# Patient Record
Sex: Female | Born: 1950
Health system: Southern US, Community
[De-identification: ages and names within clinical notes are randomized; demographics above are authoritative.]

## PROBLEM LIST (undated history)

## (undated) DIAGNOSIS — H269 Unspecified cataract: Secondary | ICD-10-CM

## (undated) DIAGNOSIS — N183 Chronic kidney disease, stage 3 unspecified: Secondary | ICD-10-CM

## (undated) DIAGNOSIS — E7800 Pure hypercholesterolemia, unspecified: Secondary | ICD-10-CM

## (undated) DIAGNOSIS — T7840XA Allergy, unspecified, initial encounter: Secondary | ICD-10-CM

## (undated) DIAGNOSIS — G9341 Metabolic encephalopathy: Secondary | ICD-10-CM

## (undated) DIAGNOSIS — C801 Malignant (primary) neoplasm, unspecified: Secondary | ICD-10-CM

## (undated) DIAGNOSIS — Z9989 Dependence on other enabling machines and devices: Secondary | ICD-10-CM

## (undated) DIAGNOSIS — R270 Ataxia, unspecified: Secondary | ICD-10-CM

## (undated) DIAGNOSIS — F0390 Unspecified dementia without behavioral disturbance: Secondary | ICD-10-CM

## (undated) DIAGNOSIS — C719 Malignant neoplasm of brain, unspecified: Secondary | ICD-10-CM

## (undated) DIAGNOSIS — H35039 Hypertensive retinopathy, unspecified eye: Secondary | ICD-10-CM

## (undated) DIAGNOSIS — C3490 Malignant neoplasm of unspecified part of unspecified bronchus or lung: Secondary | ICD-10-CM

## (undated) DIAGNOSIS — G4700 Insomnia, unspecified: Secondary | ICD-10-CM

## (undated) DIAGNOSIS — M810 Age-related osteoporosis without current pathological fracture: Secondary | ICD-10-CM

## (undated) DIAGNOSIS — E11319 Type 2 diabetes mellitus with unspecified diabetic retinopathy without macular edema: Secondary | ICD-10-CM

## (undated) HISTORY — DX: Allergy, unspecified, initial encounter: T78.40XA

## (undated) HISTORY — DX: Unspecified cataract: H26.9

## (undated) HISTORY — PX: LOBECTOMY: SHX5089

## (undated) HISTORY — DX: Dependence on other enabling machines and devices: Z99.89

## (undated) HISTORY — DX: Hypertensive retinopathy, unspecified eye: H35.039

## (undated) HISTORY — DX: Metabolic encephalopathy: G93.41

## (undated) HISTORY — DX: Age-related osteoporosis without current pathological fracture: M81.0

## (undated) HISTORY — DX: Type 2 diabetes mellitus with unspecified diabetic retinopathy without macular edema: E11.319

## (undated) HISTORY — PX: OTHER SURGICAL HISTORY: SHX169

## (undated) HISTORY — PX: EYE SURGERY: SHX253

## (undated) HISTORY — DX: Chronic kidney disease, stage 3 (moderate): N18.3

## (undated) HISTORY — DX: Insomnia, unspecified: G47.00

## (undated) HISTORY — DX: Ataxia, unspecified: R27.0

## (undated) HISTORY — DX: Chronic kidney disease, stage 3 unspecified: N18.30

## (undated) HISTORY — PX: CATARACT EXTRACTION: SUR2

---

## 1984-04-19 HISTORY — PX: ABDOMINAL HYSTERECTOMY: SHX81

## 1998-10-02 ENCOUNTER — Encounter: Admission: RE | Admit: 1998-10-02 | Discharge: 1998-10-14 | Payer: Self-pay

## 1999-02-03 ENCOUNTER — Ambulatory Visit (HOSPITAL_BASED_OUTPATIENT_CLINIC_OR_DEPARTMENT_OTHER): Admission: RE | Admit: 1999-02-03 | Discharge: 1999-02-03 | Payer: Self-pay | Admitting: Orthopedic Surgery

## 1999-04-20 HISTORY — PX: BRAIN SURGERY: SHX531

## 1999-11-01 ENCOUNTER — Encounter (INDEPENDENT_AMBULATORY_CARE_PROVIDER_SITE_OTHER): Payer: Self-pay | Admitting: Specialist

## 1999-11-01 ENCOUNTER — Encounter: Payer: Self-pay | Admitting: Emergency Medicine

## 1999-11-02 ENCOUNTER — Inpatient Hospital Stay (HOSPITAL_COMMUNITY): Admission: EM | Admit: 1999-11-02 | Discharge: 1999-11-06 | Payer: Self-pay | Admitting: Emergency Medicine

## 1999-11-02 ENCOUNTER — Encounter: Payer: Self-pay | Admitting: Neurological Surgery

## 1999-11-16 ENCOUNTER — Encounter: Admission: RE | Admit: 1999-11-16 | Discharge: 2000-02-14 | Payer: Self-pay | Admitting: Radiation Oncology

## 1999-11-20 ENCOUNTER — Ambulatory Visit (HOSPITAL_COMMUNITY): Admission: RE | Admit: 1999-11-20 | Discharge: 1999-11-20 | Payer: Self-pay | Admitting: Hematology & Oncology

## 1999-11-20 ENCOUNTER — Encounter: Payer: Self-pay | Admitting: Hematology & Oncology

## 1999-12-03 ENCOUNTER — Encounter (INDEPENDENT_AMBULATORY_CARE_PROVIDER_SITE_OTHER): Payer: Self-pay | Admitting: *Deleted

## 1999-12-03 ENCOUNTER — Ambulatory Visit (HOSPITAL_COMMUNITY): Admission: RE | Admit: 1999-12-03 | Discharge: 1999-12-03 | Payer: Self-pay | Admitting: Thoracic Surgery

## 1999-12-03 ENCOUNTER — Encounter: Payer: Self-pay | Admitting: Thoracic Surgery

## 2000-01-07 ENCOUNTER — Encounter: Payer: Self-pay | Admitting: Thoracic Surgery

## 2000-01-08 ENCOUNTER — Encounter: Payer: Self-pay | Admitting: Thoracic Surgery

## 2000-01-08 ENCOUNTER — Inpatient Hospital Stay (HOSPITAL_COMMUNITY): Admission: RE | Admit: 2000-01-08 | Discharge: 2000-01-14 | Payer: Self-pay | Admitting: Thoracic Surgery

## 2000-01-08 ENCOUNTER — Encounter (INDEPENDENT_AMBULATORY_CARE_PROVIDER_SITE_OTHER): Payer: Self-pay | Admitting: *Deleted

## 2000-01-09 ENCOUNTER — Encounter: Payer: Self-pay | Admitting: Thoracic Surgery

## 2000-01-10 ENCOUNTER — Encounter: Payer: Self-pay | Admitting: Thoracic Surgery

## 2000-01-11 ENCOUNTER — Encounter: Payer: Self-pay | Admitting: Thoracic Surgery

## 2000-01-12 ENCOUNTER — Encounter: Payer: Self-pay | Admitting: Thoracic Surgery

## 2000-01-20 ENCOUNTER — Encounter: Payer: Self-pay | Admitting: Thoracic Surgery

## 2000-01-20 ENCOUNTER — Encounter: Admission: RE | Admit: 2000-01-20 | Discharge: 2000-01-20 | Payer: Self-pay | Admitting: Thoracic Surgery

## 2000-02-05 ENCOUNTER — Encounter: Payer: Self-pay | Admitting: Neurological Surgery

## 2000-02-05 ENCOUNTER — Ambulatory Visit (HOSPITAL_COMMUNITY): Admission: RE | Admit: 2000-02-05 | Discharge: 2000-02-05 | Payer: Self-pay | Admitting: Neurological Surgery

## 2000-02-17 ENCOUNTER — Encounter: Admission: RE | Admit: 2000-02-17 | Discharge: 2000-02-17 | Payer: Self-pay | Admitting: Thoracic Surgery

## 2000-02-17 ENCOUNTER — Encounter: Payer: Self-pay | Admitting: Thoracic Surgery

## 2000-03-17 ENCOUNTER — Encounter: Payer: Self-pay | Admitting: Hematology & Oncology

## 2000-03-17 ENCOUNTER — Ambulatory Visit (HOSPITAL_COMMUNITY): Admission: RE | Admit: 2000-03-17 | Discharge: 2000-03-17 | Payer: Self-pay | Admitting: Hematology & Oncology

## 2000-05-16 ENCOUNTER — Ambulatory Visit (HOSPITAL_COMMUNITY): Admission: RE | Admit: 2000-05-16 | Discharge: 2000-05-16 | Payer: Self-pay | Admitting: Hematology & Oncology

## 2000-05-16 ENCOUNTER — Encounter: Payer: Self-pay | Admitting: Hematology & Oncology

## 2000-07-26 ENCOUNTER — Encounter: Admission: RE | Admit: 2000-07-26 | Discharge: 2000-07-26 | Payer: Self-pay | Admitting: Thoracic Surgery

## 2000-07-26 ENCOUNTER — Encounter: Payer: Self-pay | Admitting: Thoracic Surgery

## 2000-08-25 ENCOUNTER — Encounter: Payer: Self-pay | Admitting: Hematology & Oncology

## 2000-08-25 ENCOUNTER — Encounter: Admission: RE | Admit: 2000-08-25 | Discharge: 2000-08-25 | Payer: Self-pay | Admitting: Hematology & Oncology

## 2000-10-25 ENCOUNTER — Encounter: Admission: RE | Admit: 2000-10-25 | Discharge: 2000-10-25 | Payer: Self-pay | Admitting: Thoracic Surgery

## 2000-10-25 ENCOUNTER — Encounter: Admission: RE | Admit: 2000-10-25 | Discharge: 2000-10-25 | Payer: Self-pay | Admitting: Hematology & Oncology

## 2000-10-25 ENCOUNTER — Encounter: Payer: Self-pay | Admitting: Thoracic Surgery

## 2000-10-25 ENCOUNTER — Encounter: Payer: Self-pay | Admitting: Hematology & Oncology

## 2000-12-06 ENCOUNTER — Encounter: Admission: RE | Admit: 2000-12-06 | Discharge: 2000-12-06 | Payer: Self-pay | Admitting: Hematology & Oncology

## 2000-12-06 ENCOUNTER — Encounter: Payer: Self-pay | Admitting: Hematology & Oncology

## 2001-02-24 ENCOUNTER — Encounter: Admission: RE | Admit: 2001-02-24 | Discharge: 2001-02-24 | Payer: Self-pay | Admitting: Thoracic Surgery

## 2001-02-24 ENCOUNTER — Encounter: Payer: Self-pay | Admitting: Thoracic Surgery

## 2001-05-04 ENCOUNTER — Encounter: Admission: RE | Admit: 2001-05-04 | Discharge: 2001-05-04 | Payer: Self-pay | Admitting: Hematology & Oncology

## 2001-05-04 ENCOUNTER — Encounter: Payer: Self-pay | Admitting: Hematology & Oncology

## 2001-08-23 ENCOUNTER — Encounter: Admission: RE | Admit: 2001-08-23 | Discharge: 2001-08-23 | Payer: Self-pay | Admitting: Thoracic Surgery

## 2001-08-23 ENCOUNTER — Encounter: Payer: Self-pay | Admitting: Hematology & Oncology

## 2001-08-23 ENCOUNTER — Encounter: Admission: RE | Admit: 2001-08-23 | Discharge: 2001-08-23 | Payer: Self-pay | Admitting: Hematology & Oncology

## 2001-08-23 ENCOUNTER — Encounter: Payer: Self-pay | Admitting: Thoracic Surgery

## 2002-01-10 ENCOUNTER — Encounter: Payer: Self-pay | Admitting: Sports Medicine

## 2002-01-10 ENCOUNTER — Encounter: Admission: RE | Admit: 2002-01-10 | Discharge: 2002-01-10 | Payer: Self-pay | Admitting: Sports Medicine

## 2002-01-11 ENCOUNTER — Encounter: Admission: RE | Admit: 2002-01-11 | Discharge: 2002-01-11 | Payer: Self-pay | Admitting: Hematology & Oncology

## 2002-01-11 ENCOUNTER — Encounter: Payer: Self-pay | Admitting: Hematology & Oncology

## 2002-02-20 ENCOUNTER — Encounter: Admission: RE | Admit: 2002-02-20 | Discharge: 2002-02-20 | Payer: Self-pay | Admitting: Thoracic Surgery

## 2002-02-20 ENCOUNTER — Encounter: Payer: Self-pay | Admitting: Thoracic Surgery

## 2002-07-16 ENCOUNTER — Encounter: Payer: Self-pay | Admitting: Oncology

## 2002-07-16 ENCOUNTER — Encounter: Admission: RE | Admit: 2002-07-16 | Discharge: 2002-07-16 | Payer: Self-pay | Admitting: Oncology

## 2002-08-31 ENCOUNTER — Encounter: Payer: Self-pay | Admitting: Thoracic Surgery

## 2002-08-31 ENCOUNTER — Encounter: Admission: RE | Admit: 2002-08-31 | Discharge: 2002-08-31 | Payer: Self-pay | Admitting: Thoracic Surgery

## 2003-02-07 ENCOUNTER — Ambulatory Visit (HOSPITAL_COMMUNITY): Admission: RE | Admit: 2003-02-07 | Discharge: 2003-02-07 | Payer: Self-pay | Admitting: Hematology & Oncology

## 2003-02-07 ENCOUNTER — Encounter: Payer: Self-pay | Admitting: Hematology & Oncology

## 2003-08-14 ENCOUNTER — Encounter: Admission: RE | Admit: 2003-08-14 | Discharge: 2003-08-14 | Payer: Self-pay | Admitting: Thoracic Surgery

## 2004-02-13 ENCOUNTER — Encounter: Admission: RE | Admit: 2004-02-13 | Discharge: 2004-02-13 | Payer: Self-pay | Admitting: Thoracic Surgery

## 2004-08-12 ENCOUNTER — Encounter: Admission: RE | Admit: 2004-08-12 | Discharge: 2004-08-12 | Payer: Self-pay | Admitting: Thoracic Surgery

## 2005-02-01 ENCOUNTER — Ambulatory Visit: Payer: Self-pay | Admitting: Hematology & Oncology

## 2005-02-10 ENCOUNTER — Encounter: Admission: RE | Admit: 2005-02-10 | Discharge: 2005-02-10 | Payer: Self-pay | Admitting: Thoracic Surgery

## 2005-07-29 ENCOUNTER — Inpatient Hospital Stay (HOSPITAL_COMMUNITY): Admission: RE | Admit: 2005-07-29 | Discharge: 2005-07-31 | Payer: Self-pay | Admitting: Internal Medicine

## 2005-10-05 ENCOUNTER — Ambulatory Visit: Payer: Self-pay | Admitting: Internal Medicine

## 2006-02-15 ENCOUNTER — Encounter: Admission: RE | Admit: 2006-02-15 | Discharge: 2006-02-15 | Payer: Self-pay | Admitting: Thoracic Surgery

## 2007-09-26 ENCOUNTER — Ambulatory Visit (HOSPITAL_COMMUNITY): Admission: RE | Admit: 2007-09-26 | Discharge: 2007-09-26 | Payer: Self-pay | Admitting: Family Medicine

## 2008-03-19 ENCOUNTER — Ambulatory Visit: Payer: Self-pay | Admitting: Internal Medicine

## 2008-04-02 ENCOUNTER — Encounter: Payer: Self-pay | Admitting: Internal Medicine

## 2008-04-02 ENCOUNTER — Ambulatory Visit: Payer: Self-pay | Admitting: Internal Medicine

## 2008-04-05 ENCOUNTER — Encounter: Payer: Self-pay | Admitting: Internal Medicine

## 2009-06-12 ENCOUNTER — Ambulatory Visit (HOSPITAL_COMMUNITY): Admission: RE | Admit: 2009-06-12 | Discharge: 2009-06-12 | Payer: Self-pay | Admitting: Ophthalmology

## 2010-05-09 ENCOUNTER — Encounter: Payer: Self-pay | Admitting: Thoracic Surgery

## 2010-05-10 ENCOUNTER — Encounter: Payer: Self-pay | Admitting: Family Medicine

## 2010-05-19 NOTE — Procedures (Signed)
Summary: Colonoscopy   Colonoscopy  Procedure date:  04/02/2008  Findings:      Location:  [facility].    Procedures Next Due Date:    Colonoscopy: 03/2013  COLONOSCOPY PROCEDURE REPORT  PATIENT:  Jamie [name], Jamie [name]  MR#:  [id number] BIRTHDATE:   [birth year]   GENDER:   female  ENDOSCOPIST:   Iva Boop, MD, [facility]    PROCEDURE DATE:  04/02/2008 PROCEDURE:  Colonoscopy with snare polypectomy ASA CLASS:   Class I INDICATIONS: history of polyps single adenoma 2004  MEDICATIONS:    Fentanyl 50 mcg IV, Versed 5 mg IV  DESCRIPTION OF PROCEDURE:   After the risks benefits and alternatives of the procedure were thoroughly explained, informed consent was obtained.  Digital rectal exam was performed and revealed no abnormalities.   The  endoscope was introduced through the anus and advanced to the cecum, which was identified by both the appendix and ileocecal valve, without limitations.  The quality of the MiraLax prep was excellent.  The instrument was then slowly withdrawn as the colon was fully examined. <<PROCEDUREIMAGES>>        <<OLD IMAGES>>  FINDINGS:  A sessile polyp was found in the sigmoid colon. It was 7 mm in size. Polyp was snared, then cauterized with monopolar cautery. Retrieval was successful (see image4). snare polyp  Moderate diverticulosis was found (see image3).  Internal hemorrhoids were found in the rectum. They were small (see image5).  The examination was otherwise normal.   Retroflexed views in the rectum revealed no abnormalities.    The scope was then withdrawn from the patient and the procedure completed.  COMPLICATIONS:   None  ENDOSCOPIC IMPRESSION:  1) 7 mm sessile polyp in the sigmoid colon  2) Moderate diverticulosis  3) Internal hemorrhoids in the rectum  4) Otherwise normal examination  5) Prior adenoma 2004  6) Family Hx colon Cancer (elderly father) RECOMMENDATIONS:  No ASA/NSAIDS x 2 weeks  REPEAT EXAM:   In 5 year(s) for.   likely   _______________________________ Iva Boop, MD, [facility]  CC: Lindaann Pascal PA The patient      REPORT OF SURGICAL PATHOLOGY   Case #: [id number] Patient Name: Jamie [name]. Office Chart Number:  [id number]   MRN: [id number] Pathologist: Ferd Hibbs. Colonel Bald, MD DOB/Age  10/07/1950 (Age: 60)    Gender: F Date Taken:  04/02/2008 Date Received: 04/02/2008   FINAL DIAGNOSIS   ***MICROSCOPIC EXAMINATION AND DIAGNOSIS***   COLON, SIGMOID, POLYP:  - TUBULAR ADENOMA.  - HIGH GRADE DYSPLASIA IS NOT IDENTIFIED.     mj Date Reported:  04/03/2008     Ferd Hibbs. Colonel Bald, MD *** Electronically Signed Out By JBK ***   Clinical information FX HX colon cancer (father) Polypectomy  Surveillance Polypi neoplasm surveillance colonoscopy neoplasm surveillance colonoscopy (jes)    specimen(s) obtained Colon, polyp(s), sigmoid   Gross Description Received in formalin is a tan, soft tissue fragment that is submitted in toto.  Size:  0.6 cm One block  (TA:jes 04/03/08)    jes/     Signed by Iva Boop MD on 04/05/2008 at 5:11 AM  ________________________________________________________________________ 5 yr 12/14 colon recall   Signed by Iva Boop MD on 04/05/2008 at 5:12 AM  ________________________________________________________________________    April 05, 2008 MRN: [id number]    [facility] [address] [place], [place]  [address]    Dear Ms. Buchberger,  The polyp removed from your colon was adenomatous. This means that it was pre-cancerous  or that  it had the potential to change into cancer over time.  I recommend that you have a repeat colonoscopy in 5 years to determine if you have developed any new polyps over time. If you develop any new rectal bleeding, abdominal pain or significant bowel habit changes, please contact us before then.   Should you develop new or worsening symptoms of abdominal pain, bowel habit changes or bleeding from the rectum or  bowels, please schedule an evaluation with either your primary care physician or with me.  Please call us if you are having persistent problems or have questions about your condition that have not been fully answered at this time.   Sincerely,  Iva Boop MD  This letter has been electronically signed by your physician.   Signed by Iva Boop MD on 04/05/2008 at 5:13 AM    This report was created from the original endoscopy report, which was reviewed and signed by the above listed endoscopist.

## 2010-05-19 NOTE — Letter (Signed)
Summary: Patient Notice- Polyp Results  [facility]  [address] [place], [place] [address]   Phone: [phone]  Fax: [phone]        April 05, 2008 MRN: [id number]    [facility] [address] [place], [place]  [address]    Dear Ms. Frey,  The polyp removed from your colon was adenomatous. This means that it was pre-cancerous or that  it had the potential to change into cancer over time.  I recommend that you have a repeat colonoscopy in 5 years to determine if you have developed any new polyps over time. If you develop any new rectal bleeding, abdominal pain or significant bowel habit changes, please contact us before then.  Should you develop new or worsening symptoms of abdominal pain, bowel habit changes or bleeding from the rectum or bowels, please schedule an evaluation with either your primary care physician or with me.  Please call us if you are having persistent problems or have questions about your condition that have not been fully answered at this time.   Sincerely,  Iva Boop MD  This letter has been electronically signed by your physician.

## 2010-05-19 NOTE — Miscellaneous (Signed)
Summary: [facility]  Clinical Lists Changes  Medications: Added new medication of MIRALAX   POWD (POLYETHYLENE GLYCOL 3350) As per prep  instructions. - Signed Added new medication of DULCOLAX 5 MG  TBEC (BISACODYL) Day before procedure take 2 at 3pm and 2 at 8pm. - Signed Added new medication of REGLAN 10 MG  TABS (METOCLOPRAMIDE HCL) As per prep instructions. - Signed Rx of MIRALAX   POWD (POLYETHYLENE GLYCOL 3350) As per prep  instructions.;  #255gm x 0;  Signed;  Entered by: Ezra Sites RN;  Authorized by: Iva Boop MD;  Method used: Print then Give to Patient Rx of DULCOLAX 5 MG  TBEC (BISACODYL) Day before procedure take 2 at 3pm and 2 at 8pm.;  #4 x 0;  Signed;  Entered by: Ezra Sites RN;  Authorized by: Iva Boop MD;  Method used: Print then Give to Patient Rx of REGLAN 10 MG  TABS (METOCLOPRAMIDE HCL) As per prep instructions.;  #2 x 0;  Signed;  Entered by: Ezra Sites RN;  Authorized by: Iva Boop MD;  Method used: Print then Give to Patient Allergies: Added new allergy or adverse reaction of SULFA Observations: Added new observation of NKA: F (03/19/2008 8:59)    Prescriptions: REGLAN 10 MG  TABS (METOCLOPRAMIDE HCL) As per prep instructions.  #2 x 0   Entered by:   Ezra Sites RN   Authorized by:   Iva Boop MD   Signed by:   Ezra Sites RN on 03/19/2008   Method used:   Print then Give to Patient   RxID:   [id number] DULCOLAX 5 MG  TBEC (BISACODYL) Day before procedure take 2 at 3pm and 2 at 8pm.  #4 x 0   Entered by:   Ezra Sites RN   Authorized by:   Iva Boop MD   Signed by:   Ezra Sites RN on 03/19/2008   Method used:   Print then Give to Patient   RxID:   [id number] MIRALAX   POWD (POLYETHYLENE GLYCOL 3350) As per prep  instructions.  #255gm x 0   Entered by:   Ezra Sites RN   Authorized by:   Iva Boop MD   Signed by:   Ezra Sites RN on 03/19/2008   Method used:   Print then Give to Patient   RxID:    [id number]

## 2010-07-09 LAB — BASIC METABOLIC PANEL
BUN: 11 mg/dL (ref 6–23)
CO2: 29 mEq/L (ref 19–32)
Calcium: 9.7 mg/dL (ref 8.4–10.5)
Chloride: 104 mEq/L (ref 96–112)
Creatinine, Ser: 1.08 mg/dL (ref 0.4–1.2)
GFR calc Af Amer: >60 mL/min (ref >60)
GFR calc non Af Amer: 52 mL/min — ABNORMAL LOW (ref >60)
Glucose, Bld: 81 mg/dL (ref 70–99)
Potassium: 4.6 mEq/L (ref 3.5–5.1)
Sodium: 140 mEq/L (ref 135–145)

## 2010-07-09 LAB — HEMOGLOBIN AND HEMATOCRIT, BLOOD
HCT: 39 % (ref 36.0–46.0)
Hemoglobin: 13.5 g/dL (ref 12.0–15.0)

## 2010-08-14 ENCOUNTER — Other Ambulatory Visit: Payer: Self-pay | Admitting: Ophthalmology

## 2010-08-14 ENCOUNTER — Encounter (HOSPITAL_COMMUNITY): Payer: Medicare Other

## 2010-08-14 LAB — BASIC METABOLIC PANEL
BUN: 12 mg/dL (ref 6–23)
CO2: 28 mEq/L (ref 19–32)
Calcium: 10.1 mg/dL (ref 8.4–10.5)
Chloride: 102 mEq/L (ref 96–112)
Creatinine, Ser: 1.09 mg/dL (ref 0.4–1.2)
GFR calc Af Amer: >60 mL/min (ref >60)
GFR calc non Af Amer: 52 mL/min — ABNORMAL LOW (ref >60)
Glucose, Bld: 105 mg/dL — ABNORMAL HIGH (ref 70–99)
Potassium: 4 mEq/L (ref 3.5–5.1)
Sodium: 138 mEq/L (ref 135–145)

## 2010-08-14 LAB — HEMOGLOBIN AND HEMATOCRIT, BLOOD
HCT: 41 % (ref 36.0–46.0)
Hemoglobin: 13.8 g/dL (ref 12.0–15.0)

## 2010-08-20 ENCOUNTER — Ambulatory Visit (HOSPITAL_COMMUNITY)
Admission: RE | Admit: 2010-08-20 | Discharge: 2010-08-20 | Disposition: A | Discharge status: Home / Self Care | Payer: Medicare Other | Source: Ambulatory Visit | Attending: Ophthalmology | Admitting: Ophthalmology

## 2010-08-20 DIAGNOSIS — H25049 Posterior subcapsular polar age-related cataract, unspecified eye: Secondary | ICD-10-CM | POA: Insufficient documentation

## 2010-09-04 NOTE — Op Note (Signed)
[place]. [facility]  Patient:    Jamie [name], Jamie [name]                        MRN: [id number] Proc. Date: 11/03/99 Adm. Date:  [id number] Attending:  Jonne Ply                           Operative Report  PREOPERATIVE DIAGNOSIS:  Left frontoparietal brain tumor.  POSTOPERATIVE DIAGNOSIS:  Left frontoparietal brain tumor.  OPERATION PERFORMED:  Left frontoparietal craniotomy and gross total evacuation of brain tumor.  SURGEON:  Stefani Dama, M.D.  FIRST ASSISTANT:  Hewitt Shorts, M.D.  ANESTHESIA:  General endotracheal.  FROZEN SECTION DIAGNOSIS:  Malignant tumor ? glioblastoma.  INDICATIONS FOR PROCEDURE:  The patient is a 60 year old individual who developed weakness on the right side and difficulty with speech.  She was found to have a left frontoparietal brain tumor.  DESCRIPTION OF PROCEDURE:  The patient was brought to the operating room and placed on the table in supine position.  After smooth induction of general endotracheal anesthesia, she was placed in a three pin headrest and the head was slightly turned to the right so as to expose the left frontoparietal region.  A large horseshoe flap was then created after cleansing and parting the hair and shaving a small strip of hair along where the incision would be made.  The flap was brought forward.  The underlying temporalis muscle was skimmed superiorly so as to expose the region centered on the coronal suture in front of and behind it.  A bur hole was then made over the temporalis region and then the craniotomy attachment on the Midas Rex was used to raise a craniotomy flap based on the sagittal sinus.  When the flap was raised, there was a small tear in the anterior portion of the sagittal sinus.  This was sewn primarily with 4-0 Nurolon suture.  The flap of the dura was then opened around the perimeter and flapped towards the midline against the sagittal sinus.  In the posterior  aspect behind the coronal suture there was noted to be a tumor that presented to the surface.  The pia over this area was cauterized.  The tumor fluid that was a straw colored fluid that was evacuated.  The tumor was adjacent to this, was very gelatinous and suckable, so biopsy specimens were sent.  As the tumor was evacuated by suction and irrigation, it was noted that the glial surfaces of the brain were markedly discolored yellow.  Once the tumor was completely evacuated, some biopsies were obtained from this discolored and rather edematous white matter.  Once these areas were resected free and clear and hemostasis in the tumor cavity was obtained, the area was inspected and it was felt that no further resection should be performed due to the delicate nature and the immediate ____________ region that this tumor existed.  Hemostasis thus being achieved, the dura was closed with 4-0 Nurolon sutures in interrupted fashion.  A gross total resection was felt to be had.  The brain was tacked up around the perimeter and then the bone flap was replaced and secured with Osteomed plates.  The scalp was then reflected and closed with 2-0 Vicryl in interrupted fashion in the galea and 3-0 nylon was used in the skin.  The patient tolerated the procedure well and was returned to the recovery room in  stable condition. Frozen section diagnosis at the time of closure was obtained and noted to be highly malignant tumor suspicious for glioblastoma. DD:  11/03/99 TD:  11/05/99 Job: [id number]

## 2010-09-04 NOTE — Discharge Summary (Signed)
[place]. [facility]  Patient:    Jamie [name], Jamie [name]                        MRN: [id number] Adm. Date:  [id number] Disc. Date: [id number] Attending:  Jonne Ply                           Discharge Summary  ADMISSION DIAGNOSIS:  Right-sided weakness with speech difficulties secondary to right parietal brain tumor.  DISCHARGE DIAGNOSES: 1. Right parietal metastatic brain tumor. 2. Lung cancer.  CONDITION ON DISCHARGE: Improving.  PRESCRIPTIONS: 1. Dilantin 100 mg three times a day. 2. Decadron 2 mg twice a day.  FOLLOW-UP:  The patient is to return to my office on Wednesday, November 11, 1999, for suture removal.  HOSPITAL COURSE: Jamie [name] is a 60 year old individual who presented to the emergency room with right-sided weakness of several days duration. She also complained of some headache.  A CT scan of the head demonstrated the presence of a left parietal mass with surrounding vasogenic edema.  Chest x-ray and follow-up work-up of the chest demonstrated presence of a right lung mass with some hilar nodes.  The patient underwent craniotomy for evacuation of the mass lesion on Tuesday, November 03, 1999.  She tolerated the procedure well; however, she has had weakness in the right arm.  She was seen in consultation by Rose Phi. Myna Hidalgo, M.D., from oncology service for further work-up regarding the chest mass. At this point the patient is improving in her right arm strength. She is ambulatory.  Speech difficulties appear to be resolving.  She is communicating quite well.  She was given a prescription for Dilantin and Decadron.  The pain has been minimal and she is taking Tylenol for this purpose. She will be seen in the office next week for further follow-up. DD:  11/06/99 TD:  11/10/99 Job: [id number]

## 2010-09-04 NOTE — H&P (Signed)
NAMESHARONA, ROVNER                 ACCOUNT NO.:  [id number]   MEDICAL RECORD NO.:  [id number]          PATIENT TYPE:  EMS   LOCATION:  [facility]                         FACILITY:  [facility]   PHYSICIAN:  Lonia Blood, M.D.DATE OF BIRTH:  [birth year]   DATE OF ADMISSION:  07/28/2005  DATE OF DISCHARGE:                                HISTORY & PHYSICAL   CHIEF COMPLAINT:  Cough with shortness of breath.   HISTORY OF PRESENT ILLNESS:  Ms. Jamie [name] is a very pleasant 61 year old  female with a history of stage IV non-small cell lung cancer who is status  post thoracotomy with right lower lobe lobectomy and a craniotomy for right  parietal lobe metastatic resection.  She has been in her usual state of  health and has had a clean bill of health from her oncologist and her  thoracic surgeon. One week ago, she experienced the abrupt onset of a dry  cough.  This progressed over the last seven days to turn into a cough  productive of thick sputum.  This has become associated with a sharp,  intermittent central chest pain.  This has further been accompanied by  shortness of breath, fevers, and chills over the last 2-3 days.  The  symptoms have worsened significantly today and, therefore, she presented to  the emergency room  for further evaluation.  The patient reports that,  otherwise, she has been in her usual state of health with no acute  difficulties.   REVIEW OF SYSTEMS:  Comprehensive review of symptoms is unremarkable with  the exception of the elements noted in the history of present illness above.   PAST MEDICAL HISTORY:  1.  Stage IV non-small cell lung cancer.      1.  Status post right parietal lobe metastatic lesion resection via          craniotomy in 2001.      2.  Status post VATS with thoracotomy and right lower lobe lobectomy in          2001.      3.  Status post radiation therapy with no chemotherapy.      4.  Tiny right upper lobe nodularity via CT scan in 2006 which  is stable          compared to the 2004 scan.  2.  Status post hysterectomy.  3.  History of tobacco abuse discontinued at the diagnosis of cancer.  4.  Normocytic anemia with hemoglobin 12.4 in September 2001.  5.  Hypertension.   MEDICATIONS:  1.  Aspirin 81 mg daily.  2.  Calcium daily.  3.  Folate daily.  4.  HCTZ 12.5 mg daily.  5.  Lopid dose unknown daily.   ALLERGIES:  SULFA leads to hives.   FAMILY HISTORY:  The patient's mother is deceased from unknown causes.  The  patient's father died from an MI in his 39s.   SOCIAL HISTORY:  The patient occasionally partakes of alcohol but never to  access.  She lives alone, she lives in [place], she is disabled due to her  lung cancer and subsequent treatment.   LABORATORY DATA:  Urinalysis reveals small hemoglobin, 100 protein, but is  otherwise unremarkable.  Chest x-ray reveals a right lower lobe infiltrate  with questionable early left lower lobe infiltrate.  White count is markedly  elevated at 15.6, hemoglobin low at 11.2, MCV normal, platelets normal.  Sodium, chloride, and bicarb are normal.  Potassium was low at 3.3.  BUN 16,  creatinine 1.2.  Serum glucose is elevated at 130, calcium 8.3.   PHYSICAL EXAMINATION:  VITAL SIGNS:  Temperature 97.9, blood pressure 87/59 and 92/56, heart rate  100, respiratory rate 22, O2 saturation 90% on 2 liters per minute nasal  cannula.  GENERAL:  Well developed, well nourished female who is presently somewhat  lethargic but displaying comfortable respirations.  HEENT:  Normocephalic, atraumatic, pupils equal, round, reactive to light  and accommodation, though somewhat constricted bilaterally.  OC/OP clear.  NECK:  No JVD, no lymphadenopathy.  LUNGS:  Bibasilar crackles with decreased air movement on the right base  without wheezes.  HEART:  Mildly tachycardic at approximately 100 beats per minute without  murmurs, gallops, and rubs with normal S1 and S2.  ABDOMEN:  Nontender,  nondistended, soft, bowel sounds present but  hypoactive.  No hepatosplenomegaly.  No rebound.  EXTREMITIES:  No significant cyanosis, clubbing, and edema in the bilateral  lower extremities.  NEUROLOGICAL:  The patient is somewhat lethargic at present but is easily  arousable.  She displays 5/5 strength in her extremities at the present  time.  Cranial nerves 2-12 are intact.   IMPRESSION AND PLAN:  1.  Bibasilar pneumonia - The patient presents with clinical and x-ray      evidence of a community acquired bilateral lower lobe pneumonia.  She      will be treated empirically with Rocephin and Azithromycin for atypical      coverage.  Symptomatic treatment will be afforded via Albuterol and      Humibid.  Robitussin will be added as necessary.  2.  Hypotension - Clinically, the patient appears to be in an early stage of      sepsis.  She is tachycardic and hypotensive.  Blood cultures have been      obtained.  The patient is being dosed with empiric antibiotics.  She      will be watched in the stepdown unit to insure that she stabilizes.      Aggressive IV fluid resuscitation will be carried out using isotonic      saline with supplemental potassium.  3.  Hypokalemia - The patient's hypokalemia is likely secondary to decreased      intake in the face of ongoing diuretic therapy.  The patient will be      supplemented via IV and we will follow the trend.  Magnesium level will      be obtained.  4.  Lung cancer status post craniotomy and thoracotomy - clinically, the      patient appears to be stable at this time.  She reports that follow up      with Dr. Edwyna Shell has been positive since her surgery in 2001.  There is      no indication the patient's current pneumonia is a complicated      pneumonia.  If the patient shows a failure to respond to conservative      therapy, we will consider CT scan of the chest to insure that there are     no new lesions  complicating her treatment.  5.   Normocytic anemia - the patient presents with a normocytic anemia.      Hemoglobin in September 2001 was, in fact, normal.  In a 59 year old      female, unexplained normocytic anemia can be concerning.  We will check      an iron panel, ferritin, B12, and folate.  We will guaiac stools.      Further workup will be directed by the results of these studies.  6.  Elevated serum glucose - the patient has an elevated serum glucose at      130 at the time of her presentation to the emergency room.  She has no      known history of diabetes mellitus.  This likely represents a stress      reaction given      that the patient is currently in an early stage of sepsis.  We will      choose to not treat at this time and we will simply follow the patient's      blood sugar trend.  Should her sugar fail to improve with the      appropriate clinical intervention and to the proposed underlying      etiology, we will consider further evaluation.      Lonia Blood, M.D.  Electronically Signed     JTM/MEDQ  D:  07/28/2005  T:  07/28/2005  Job:  [id number]   cc:   Jamie [name], M.D.  [address]  [place]  [place] [address]   Jamie [name], M.D.  Fax: [phone]

## 2010-09-04 NOTE — H&P (Signed)
[place]. [facility]  Patient:    Jamie [name], Jamie [name]                        MRN: [id number] Adm. Date:  [id number] Attending:  Jonne Ply                         History and Physical  ADMITTING DIAGNOSIS:  Left frontal brain tumor.  HISTORY OF PRESENT ILLNESS:  The patient is a 60 year old right-handed white female who has had three to four day onset of right-sided weakness.  She noted this initially with some handwriting difficulties when she was trying to write a check.  She subsequently was noted to have some abnormal gait by her sons, and over the past 48 hours, she has developed some speech problems.  She was seen in the emergency room last night by the emergency room doctor, and a CT scan of the brain demonstrated the presence of a large left frontal lesion. She has no known history of malignancy.  She has had a hysterectomy some 16 years ago.  She is, however, a smoker.  The patient notes that prior to this, she has been feeling well and does not see a physician regularly for any medical problems.  PAST MEDICAL HISTORY:  As such, without significant medical problems.  ALLERGIES:  She knows no allergies to any medications.  MEDICATIONS:  She uses no medication on a regular basis.  HABITS:  She smokes about a half a pack of cigarettes a day.  She drinks alcohol socially.  SYSTEMS REVIEW:  Notable for no complaints in the GI, GU, cardiovascular, respiratory, neurologic, musculoskeletal systems.  PHYSICAL EXAMINATION:  GENERAL:  Reveals that she is an alert and oriented individual with moderate degree of speech difficulty, difficulty finding words, but the words she does produce are fluent.  The patients attention and concentration appear to be normal.  She has a marked right-sided weakness, graded at 3/5, with a very severe droop.  She has weakness in the right lower extremity also.  HEENT:  Her cranial nerves reveal her pupils are 4 mm,  briskly reactive to light and accommodation.  Extraocular movements are full.  The face has a moderate right facial droop.  The tongue protrudes in the midline.  The uvula is midline.  The sclerae and conjunctivae are clear.  NECK:  Reveals no masses and no bruits are heard.  LUNGS:  Clear to auscultation.  HEART:  Regular rate and rhythm.  ABDOMEN:  Soft.  Bowel sounds are positive.  No masses are palpable.  EXTREMITIES:  Reveal no cyanosis, clubbing, or edema.  Peripheral pulses are intact.  NEUROLOGIC:  The patients right lower extremity weakness is significant also. Gait was not tested, but the patient demonstrates weakness and some spasticity of movement in the right lower extremity.  Her deep tendon reflexes are 2+ in the biceps on the right, 2+ in the triceps, 2+ in the patella, and 1+ in the Achilles.  On the left side, her reflexes are 1+ throughout.  Cortical sensation appears intact on the right side.  IMPRESSION:  Patient has a left frontal region.  Performed a chest x-ray, report demonstrates the presence of a hilar mass, suspicious for a tumor.  The patient will be evaluated with a CT scan of the chest today. DD:  11/02/99 TD:  11/02/99 Job: [id number]

## 2010-09-04 NOTE — Discharge Summary (Signed)
Jamie [name], Jamie [name]                 ACCOUNT NO.:  [id number]   MEDICAL RECORD NO.:  [id number]          PATIENT TYPE:  INP   LOCATION:  1605                         FACILITY:  [facility]   PHYSICIAN:  Lonia Blood, M.D.       DATE OF BIRTH:  06-Aug-1950   DATE OF ADMISSION:  07/29/2005  DATE OF DISCHARGE:  07/31/2005                                 DISCHARGE SUMMARY   DISCHARGE DIAGNOSES:  1.  Right lower lobe pneumonia.  2.  History of metastatic lung cancer; no signs of recurrence.  3.  Mild chronic obstructive pulmonary disease.  4.  Hypokalemia, resolved.  5.  Hypophosphatemia, resolved.  6.  Anemia.  The patient will require full evaluation by primary care      physician if she has never had a colonoscopy.  7.  Hypertension.  8.  Early sepsis, resolved.  9.  Herpes simplex virus, type 1, on the lips.   MEDICATIONS:  1.  Avelox 400 mg by mouth daily for 1 week.  2.  Albuterol MDI 1 puff 4 times a day.  3.  Aspirin 81 mg daily.  4.  Folic acid 1 mg daily.  5.  Mucinex 800 mg twice daily.  6.  Calcium 500 mg daily.  7.  Zovirax ointment 4 times a day for 3 days.   CONDITION ON DISCHARGE:  Jamie [name] will be discharged in fair condition.  At  time of discharge, she was instructed to continue antibiotics, to have food,  water, lip, and to increase activity slowly.  She also was instructed to  follow up with her physician, Dr. Dimple Casey, next week.  She will need a followup  chest x-ray to assure resolution of pneumonia, and she will need to follow  up labs for anemia to assure hemoglobin is stable.  If she never has had a  colonoscopy, she will require one.   PROCEDURES DURING THIS ADMISSION:  Ms. Delio underwent a chest x-ray that  showed a right lower lobe pneumonia.   CONSULTATIONS:  1.  The patient was seen in consultation by physical therapy who deemed her      safe to be discharged home in terms of mobility.  2.  Also she was seen by occupational therapy who decided the patient  did      not need followup occupational therapy.   HISTORY AND PHYSICAL:  For admission History and Physical, refer to H&P done  by Dr. Sharon Seller on July 29, 2005.   HOSPITAL COURSE:  #1.  EARLY SEPSIS:  Jamie [name] was admitted to the intensive care unit at  [facility].  She was started on broad-spectrum intravenous  antibiotics and liberal IV fluids.  She improved significantly, and by  hospital day #2, her sepsis had resolved.   #2.  RIGHT LOWER LOBE PNEUMONIA:  Ms.  [name] was started on intravenous  Rocephin and Zithromax, and she responded promptly.  Her chest x-ray showed  some mild improvement by the time of discharge.  The patient's leukocytosis  was resolved, and by the time of discharge, white blood  cell count was 6000  which was down from 15,000 on admission.  Jamie [name] was observed 24 hours on  oral antibiotics, and she remains afebrile without worsening of her  symptoms.  At the time of discharge, she was discharged on Avelox and  albuterol MDI, and she will follow up with her primary care physician to  assure that pneumonia resolves.   #3.  HYPOKALEMIA AND HYPOPHOSPHATEMIA SECONDARY TO DIURETIC USE: The  patient's hydrochlorothiazide was held.  Her potassium and phosphorus were  repleted, and she will follow up with Dr. Dimple Casey for recheck for basic  metabolic profile and decision about future use of hydrochlorothiazide.   #4.  HERPES LABIALIS:  On hospital day #2, Jamie [name] developed a lesion on  her lips consistent with HSV-1.  She was started on Zovirax locally.   #5.  CHRONIC OBSTRUCTIVE PULMONARY DISEASE:  This is really mild in nature.  The patient did not have any wheezing or respiratory failure.  She was  discharged home on an albuterol MDI p.r.n.      Lonia Blood, M.D.  Electronically Signed     SL/MEDQ  D:  07/31/2005  T:  08/01/2005  Job:  [id number]   cc:   Magnus Sinning. Rice, M.D.  Fax: [phone]

## 2010-09-04 NOTE — Discharge Summary (Signed)
[place]. [facility]  Patient:    Jamie [name], Jamie [name]                        MRN: [id number] Adm. Date:  [id number] Disc. Date: 01/14/00 Attending:  Cameron Proud Dictator:   Carlye Grippe. CC:         Rose Phi. Myna Hidalgo, M.D.  Stefani Dama, M.D.  Margaretmary Dys, M.D.   Discharge Summary  DATE OF BIRTH:  1951/01/23  HISTORY OF PRESENT ILLNESS:  This is a 60 year old female first seen in August 2001 at [facility] after presenting at the [facility] Emergency Room with complaints of right-sided weakness and dysarthria.  CT scan showed a left parietofrontal brain tumor.  She was evaluated by Dr. Danielle Dess and underwent tumor resection with postoperative radiation.  Pathology revealed a poorly differentiated cancer presumably metastatic from the lung.  Chest x-ray and CT scans confirmed right lower lung lesion.  She had no mediastinal adenopathy. Other metastatic workup including bone scan and CT scan were negative. Patient underwent mediastinoscopy with negative 4L and 4R lymph nodes. Radiation therapy was completed on December 18, 1999.  She was admitted this hospitalization for the right lower lobe resection.  PAST MEDICAL HISTORY:  Brain metastatic disease and right lower lobe lung cancer as described above otherwise unremarkable.  She is on estrogen replacement therapy.  PAST SURGICAL HISTORY:  Craniotomy July 2001, history of bronchoscopy and mediastinoscopy August 2001, history of hysterectomy 1986, history of carpal tunnel surgery left wrist in 2000.  ADMISSION MEDICATIONS: 1. Dilantin 100 mg t.i.d. 2. Premarin 1.25 mg q.d.  ALLERGIES:  SULFA.  REVIEW OF SYSTEMS/FAMILY HISTORY/SOCIAL HISTORY/PHYSICAL EXAMINATION:  Please see the history and physical done at the time of admission.  HOSPITAL COURSE:  Patient was admitted and brought to the operating room where she underwent the following procedure: Right video-assisted thoracoscopy  with right thoracotomy and right lower lobe lobectomy with lymph node dissection. Patient tolerated the procedure well and was taken to the postanesthesia care unit in stable condition.  Postoperative hospital course, pathologies revealed poorly differentiated nonsmall cell carcinoma with focal involvement of the visceral pleura.  Bronchial margin was negative for tumor.  One pair of bronchial lymph node was negative for tumor.  One 9R lymph node was negative for tumor, one 10R showed no tumor but extensive necrosis, and level 7 lymph node was negative for tumor.  The patient has responded well in regards to postsurgical recovery.  Oxygen has been weaned and she maintains good saturations on room air.  Her incisions are healing well.  She has  tolerated a gradual increase in activities.  She has undergone significant pulmonary toilet and has responded well.  Her hemodynamics have remained stable.  Her laboratory values are felt to be stable.  Hemoglobin and hematocrit dated January 10, 2000 were stable at 12 and 36 respectively.  Her chest tubes have been discontinued as have all routine lines and monitoring devices.  Overall, she is felt to be quite stable for tentative discharge in the morning of January 14, 2000 pending morning- round re-evaluation.  DISCHARGE MEDICATIONS: 1. Dilantin 100 mg t.i.d. 2. Premarin 1.25 mg q.d. 3. For pain, Tylox one or two every four to six hours as needed for pain.  DISCHARGE INSTRUCTIONS:  The patient received written instructions in regards to medications, activity, diet, wound care, and follow up.  FOLLOW-UP:  Dr. Edwyna Shell next week, Wednesday, January 20, 2000  with a chest x-ray at that time.  FINAL DIAGNOSIS:  Right lower lobe lung carcinoma as described above. DD:  01/13/00 TD:  01/13/00 Job: [id number]

## 2010-09-04 NOTE — Op Note (Signed)
[place]. [facility]  Patient:    Jamie [name], Jamie [name]                        MRN: [id number] Adm. Date:  [id number] Attending:  Cameron Proud CC:         Norton Blizzard, M.D. x 2  Rose Phi. Myna Hidalgo, M.D.   Operative Report  PREOPERATIVE DIAGNOSIS:  Stage IV nonsmall cell lung cancer.  POSTOPERATIVE DIAGNOSIS:  Stage IV nonsmall cell lung cancer.  PROCEDURE:  Right VATS, right thoracotomy, and right lower lobectomy with node dissection.  SURGEON:  D. Karle Plumber, M.D.  ASSISTANTAl Pimple.  INDICATIONS:  This patient presented with brain metastasis and underwent resection and radiation.  After this occurred, she had bronchoscopy and mediastinoscopy which was negative.  The lesion was in the superior segment of the right lower lobe and it was worried that there may be some subcarinal adenopathy.  DESCRIPTION OF PROCEDURE:  After percutaneous insertion of all monitoring lines, the patient underwent general anesthesia and was turned to the right lateral thoracotomy position and was prepped and draped in the usual sterile fashion.  Two trocar sites were made in the anterior and posterior axillary lines at the 7th intercostal space.  Two trocars were inserted and the lesion could be seen in the superior segment of the right lower lobe.  There was no evidence of any intrapleural metastasis and appeared to be resectable.  A small posterolateral thoracotomy was made over the fifth intercostal space with the lattissimus being divided and the serratus reflected anterior.  Two Toupiers were placed at the right angle in the intercostal space.  Dissection was started taking down the inferior pulmonary ligament with electrocautery and then dissecting out the inferior pulmonary vein.  It was then divided with an autosuture vascular stapler.  The inferior portion of the major fissure was fairly complete, but the artery was easy to visualize and was  very difficult to dissect out.  There was marked inflammation of the tissues around it and it was very scarred.  Finally, the basilar branches to the right lower lobe were dissected out and the artery was looped and stapled with an autosuture 30 stapler.  Then the dissection was carried superiorly up the artery and there was a 10R node that appeared to be positive between the pulmonary artery and the right upper lobe bronchus.  This was dissected out and in dissecting it out there was some bleeding from the posterior branch of the pulmonary artery and this was controlled with a horizontal mattress 4-0 Prolene suture. However, the superior segment of the branch to the right lower lobe was intimately involved with the bronchus.  The superior portion of the major fissure was dissected up and then stapled with two applications of the autosuture stapler.  This did divide the fissure.  Finally, the bronchus and the superior segmental artery were stapled with the Ethicon TL30 stapler and divided.  Bronchial margins were negative and attention was then turned to the subcarinal space and this was opened below the bronchus and several large nodes were dissected out which appeared to be positive in nature.  After all of these were removed and all bleeding was stopped with electrocautery, the two chest tubes were placed through the trocar sites and tied in place.  The lungs were reexpanded and there was minimal air leak.  Chest was closed with three pericostals #1 Vicryl in the muscle layer  and 2-0 Vicryl in the subcutaneous tissue and Ethicon skin clips.  The patient was returned to the recovery room in stable condition. DD:  01/08/00 TD:  01/08/00 Job: [id number]

## 2010-09-04 NOTE — Op Note (Signed)
[place]. [facility]  Patient:    Jamie [name], Jamie [name]                        MRN: [id number] Adm. Date:  [id number] Attending:  Cameron Proud CC:         Two copies to Dr. Charline Bills R. Myna Hidalgo, M.D.                           Operative Report  PREOPERATIVE DIAGNOSIS:  Probable nonsmall cell lung cancer, right lower lobe with brain metastasis.  POSTOPERATIVE DIAGNOSIS:  Probable nonsmall cell lung cancer, right lower lobe with brain metastasis.  OPERATION:  Fiberoptic bronchoscopy and mediastinoscopy.  SURGEON:  D. Karle Plumber, M.D.  INDICATIONS:  This 60 year old patient presented with brain metastasis which was after resection in the right parietal frontal lobe with resection was found to have metastatic cancer probably from a right lower lobe lesion.  She is referred for bronchoscopy and mediastinoscopy.  DESCRIPTION OF PROCEDURE:  After general anesthesia, the fiberoptic bronchoscopy was passed through the endotracheal tube. The left upper lobe and left lower lobe orifices were normal.  The left main stem bronchus was normal. The right upper lobe and right main stem bronchus and bronchial intermedius orifices were normal.  The right middle lobe was normal as well as the right lower lobe.  There was question of maybe some extrinsic narrowing of the superior segment of the right lower lobe.  Brushings were taken from this area but there was nothing there to biopsy.  The fiberoptic bronchoscope was removed.  The anterior neck was prepped and draped in the usual sterile manner. Transverse incision was made and was carried down with electrocautery through subcutaneous tissue and fascia.  The pretracheal fascia was entered. The video mediastinoscope was inserted and biopsies of 4R and 2R nodes were done.  The video mediastinoscope was removed.  Strap muscles were closed with 2-0 Vicryl.  Subcutaneous tissue with 3-0 Vicryl.   Steri-Strips were applied. The patient was returned to recovery room in stable condition. DD:  12/03/99 TD:  12/03/99 Job: [id number]

## 2010-09-04 NOTE — Op Note (Signed)
[place]. [facility]  Patient:    Jamie [name], Jamie [name]                        MRN: [id number] Adm. Date:  [id number] Disc. Date: [id number] Attending:  Cameron Proud CC:         Norton Blizzard, M.D.  Anesthesia Department   Operative Report  PREOPERATIVE DIAGNOSIS:  Lung neoplasm, likely metastatic.  OPERATIVE PROCEDURE:  Thoracotomy for excision and lobectomy, approved by Dr. Dewayne Shorter.  ANESTHESIA PROCEDURE:  Placement of thoracic epidural for postoperative analgesia.  Preoperatively, risks, benefits, placement of the epidural catheter for postoperative analgesia were discussed with the patient by Dr. Darlyn Read. This was planned to be placed at the end of the procedure.  The patient consented to the placement of the epidural for her postoperative analgesia. In addition, this had been requested by her attending surgeon, Dr. Dewayne Shorter.  At the end of the operative procedure, Dr. Zoila Shutter was unavailable.  I was requested to attend the patient.  I did.  The patient was already in the left lateral decubitus position.  A sterile prep of the mid thoracic area was conducted.  Using a #17-gauge Tuohy needle adjacent to the T7-8 interspace, the epidural space was contacted with loss of resistance technique, and the catheter threaded approximately 3-4 cm beyond needle tip, and the needle was removed.  After negative aspiration for both heme and CSF, the catheter was injected with 7cc of 0.25% Marcaine containing 100 mcg of Fentanyl.  This was secured in place with tape.  The patient was turned supine and transferred to the PACU in stable condition. DD:  01/08/00 TD:  01/10/00 Job: [id number]

## 2010-09-28 NOTE — Op Note (Signed)
  NAME:  Jamie [name], Jamie [name]                 ACCOUNT NO.:  [id number]  MEDICAL RECORD NO.:  [id number]           PATIENT TYPE:  O  LOCATION:  DAYP                          FACILITY:  [facility]  PHYSICIAN:  Susanne Greenhouse, MD       DATE OF BIRTH:  13-Nov-1950  DATE OF PROCEDURE:  08/20/2010 DATE OF DISCHARGE:                              OPERATIVE REPORT   PREOPERATIVE DIAGNOSIS:  Posterior subcapsular cataract, right eye, diagnosis code 366.14  POSTOPERATIVE DIAGNOSIS:  Posterior subcapsular cataract, right eye, diagnosis code 366.14.  PROCEDURE PERFORMED:  Phacoemulsification with intraocular lens implantation, right eye.  SURGEON:  Susanne Greenhouse, MD  DESCRIPTION OF OPERATION:  In the preoperative holding area, dilating drops and viscous lidocaine were placed into the left eye.  The patient was then brought to the operating room where he was prepped and draped. Beginning with a 75-blade, a paracentesis port was made at the surgeon's 2 o'clock position.  The anterior chamber was then filled with a 1% nonpreserved lidocaine solution.  This was followed by filling the anterior chamber with Provisc.  The 2.4-mm keratome blade was then used to make a clear corneal incision at the temporal limbus.  A bent cystotome needle and Utrata forceps were used to create a continuous tear capsulotomy.  Hydrodissection was performed with balanced salt solution on a fine cannula.  The lens nucleus was then removed using phacoemulsification in a quadrant cracking technique.  Residual cortex was removed with irrigation and aspiration.  The capsular bag and anterior chamber were then refilled with Provisc and a posterior chamber intraocular lens was placed into the capsular bag without difficulty using a lens injecting system.  The Provisc was removed from the capsular bag and anterior chamber with irrigation and aspiration. Stromal hydration of the main incision and paracentesis ports was performed with balanced  salt solution on a fine cannula.  The wounds were tested for leak, which were negative.  The patient tolerated the procedure well.  There were no operative complications and he was returned to the recovery area in satisfactory condition.  There were no surgical specimens.  PROSTHETIC DEVICE USED:  A Lenstec posterior chamber lens, model Softec HD, power of 21.5, serial number is [id number].          ______________________________ Susanne Greenhouse, MD    KEH/MEDQ  D:  08/21/2010  T:  08/21/2010  Job:  [id number]  Electronically Signed by Gemma Payor MD on 09/28/2010 12:20:54 PM

## 2011-03-21 ENCOUNTER — Emergency Department (HOSPITAL_COMMUNITY): Payer: Medicare Other

## 2011-03-21 ENCOUNTER — Other Ambulatory Visit: Payer: Self-pay

## 2011-03-21 ENCOUNTER — Observation Stay (HOSPITAL_COMMUNITY)
Admission: EM | Admit: 2011-03-21 | Discharge: 2011-03-22 | Disposition: A | Discharge status: Home / Self Care | Payer: Medicare Other | Attending: Internal Medicine | Admitting: Internal Medicine

## 2011-03-21 DIAGNOSIS — E785 Hyperlipidemia, unspecified: Secondary | ICD-10-CM | POA: Insufficient documentation

## 2011-03-21 DIAGNOSIS — R413 Other amnesia: Secondary | ICD-10-CM | POA: Diagnosis present

## 2011-03-21 DIAGNOSIS — I959 Hypotension, unspecified: Secondary | ICD-10-CM | POA: Diagnosis present

## 2011-03-21 DIAGNOSIS — I9589 Other hypotension: Secondary | ICD-10-CM | POA: Insufficient documentation

## 2011-03-21 DIAGNOSIS — Z85841 Personal history of malignant neoplasm of brain: Secondary | ICD-10-CM | POA: Insufficient documentation

## 2011-03-21 DIAGNOSIS — R55 Syncope and collapse: Principal | ICD-10-CM | POA: Insufficient documentation

## 2011-03-21 DIAGNOSIS — Z859 Personal history of malignant neoplasm, unspecified: Secondary | ICD-10-CM

## 2011-03-21 DIAGNOSIS — Z85118 Personal history of other malignant neoplasm of bronchus and lung: Secondary | ICD-10-CM | POA: Insufficient documentation

## 2011-03-21 DIAGNOSIS — E860 Dehydration: Secondary | ICD-10-CM | POA: Insufficient documentation

## 2011-03-21 HISTORY — DX: Malignant (primary) neoplasm, unspecified: C80.1

## 2011-03-21 HISTORY — DX: Malignant neoplasm of brain, unspecified: C71.9

## 2011-03-21 HISTORY — DX: Malignant neoplasm of unspecified part of unspecified bronchus or lung: C34.90

## 2011-03-21 LAB — DIFFERENTIAL
Basophils Absolute: 0 10*3/uL (ref 0.0–0.1)
Basophils Relative: 1 % (ref 0–1)
Eosinophils Absolute: 0 10*3/uL (ref 0.0–0.7)
Eosinophils Relative: 0 % (ref 0–5)
Lymphocytes Relative: 7 % — ABNORMAL LOW (ref 12–46)
Lymphs Abs: 0.6 10*3/uL — ABNORMAL LOW (ref 0.7–4.0)
Monocytes Absolute: 0.6 10*3/uL (ref 0.1–1.0)
Monocytes Relative: 8 % (ref 3–12)
Neutro Abs: 7.1 10*3/uL (ref 1.7–7.7)
Neutrophils Relative %: 85 % — ABNORMAL HIGH (ref 43–77)

## 2011-03-21 LAB — CBC
HCT: 39.4 % (ref 36.0–46.0)
Hemoglobin: 13.3 g/dL (ref 12.0–15.0)
MCH: 31.1 pg (ref 26.0–34.0)
MCHC: 33.8 g/dL (ref 30.0–36.0)
MCV: 92.1 fL (ref 78.0–100.0)
Platelets: 182 10*3/uL (ref 150–400)
RBC: 4.28 MIL/uL (ref 3.87–5.11)
RDW: 12.9 % (ref 11.5–15.5)
WBC: 8.3 10*3/uL (ref 4.0–10.5)

## 2011-03-21 LAB — BASIC METABOLIC PANEL
BUN: 15 mg/dL (ref 6–23)
CO2: 24 mEq/L (ref 19–32)
Calcium: 9.3 mg/dL (ref 8.4–10.5)
Chloride: 101 mEq/L (ref 96–112)
Creatinine, Ser: 1.14 mg/dL — ABNORMAL HIGH (ref 0.50–1.10)
GFR calc Af Amer: 60 mL/min — ABNORMAL LOW (ref >90)
GFR calc non Af Amer: 52 mL/min — ABNORMAL LOW (ref >90)
Glucose, Bld: 127 mg/dL — ABNORMAL HIGH (ref 70–99)
Potassium: 3.7 mEq/L (ref 3.5–5.1)
Sodium: 137 mEq/L (ref 135–145)

## 2011-03-21 LAB — POCT I-STAT TROPONIN I: Troponin i, poc: 0 ng/mL (ref 0.00–0.08)

## 2011-03-21 MED ORDER — SODIUM CHLORIDE 0.9 % IV BOLUS (SEPSIS)
500.0000 mL | Freq: Once | INTRAVENOUS | Status: AC
  Administered 2011-03-21: 500 mL via INTRAVENOUS

## 2011-03-21 NOTE — ED Notes (Signed)
All ?'s answered by son, pt lethargic during triage.

## 2011-03-21 NOTE — ED Provider Notes (Signed)
History  Scribed for Joya Gaskins, MD, the patient was seen in APA19/APA19. The chart was scribed by Gilman Schmidt. The patients care was started at 9:04 PM.  CSN: [id number] Arrival date & time: 03/21/2011  8:51 PM   First MD Initiated Contact with Patient 03/21/11 2052      Chief Complaint  Patient presents with  . Fall    HPI Jamie [name] is a 60 y.o. female who presents to the Emergency Department complaining of fall. Pt reports falling while getting newspaper this am, unable to recall any events after fall, family stated she has been "like a zombie" all day, are also worried that she may have taken wrong meds or to many, denies any pain. Per family, patient has been confused and sleeping all day. Pt reports neck pain, and right arm pain. Denies any headache, chest pain, SOB. There are no other associated symptoms and no other alleviating or aggravating factors.  Pt takes lunesta, she is unsure if she took too many and this if caused her fall    Past Medical History  Diagnosis Date  . Cancer   . Brain cancer   . Lung cancer     Past Surgical History  Procedure Date  . Brain surgery       History  Substance Use Topics  . Smoking status: Never Smoker   . Smokeless tobacco: Not on file  . Alcohol Use: Yes     occ    OB History    Grav Para Term Preterm Abortions TAB SAB Ect Mult Living                  Review of Systems  HENT: Negative for neck pain.   Respiratory: Negative for shortness of breath.   Cardiovascular: Negative for chest pain.  Gastrointestinal: Negative for nausea, vomiting and diarrhea.  Musculoskeletal: Negative for back pain.       Shoulder pain  All other systems reviewed and are negative.    Allergies  Sulfonamide derivatives  Home Medications  No current outpatient prescriptions on file.  BP 75/50  Pulse 78  Temp(Src) 98.5 F (36.9 C) (Oral)  Resp 16  Ht 5\' 6"  (1.676 m)  Wt 268 lb (121.564 kg)  BMI 43.26 kg/m2  SpO2  98%  Physical Exam CONSTITUTIONAL: Well developed/well nourished HEAD AND FACE: Normocephalic/atraumatic EYES: EOMI/PERRL ENMT: Mucous membranes dry NECK: supple no meningeal signs, C-spine tenderness SPINE:T/L spine tender, no stepoffs CV: S1/S2 noted, no murmurs/rubs/gallops noted LUNGS: Lungs are clear to auscultation bilaterally, no apparent distress ABDOMEN: soft, nontender, no rebound or guarding GU:no cva tenderness NEURO: Pt is awake/alert, moves all extremitiesx4 No focal motor deficit.  GCS 15 EXTREMITIES: pulses normal, full ROM SKIN: warm, color normal PSYCH: no abnormalities of mood noted   ED Course  Procedures     DIAGNOSTIC STUDIES: Oxygen Saturation is 98% on room air, normal by my interpretation.      Date: 03/21/2011  Rate: 85  Rhythm: normal sinus rhythm  QRS Axis: normal  Intervals: normal  ST/T Wave abnormalities: nonspecific ST changes  Conduction Disutrbances:none  Narrative Interpretation:   Old EKG Reviewed: unchanged   COORDINATION OF CARE: 9:04pm:  - Patient evaluated by ED physician, CT Head, CT Cervical Spine, DG Chest, BMP, CBC, Diff ordered  9:29 PM Pt with low BP but awake/alert, likely dehydrated Will image and reassess Will follow closely  11:55 PM Pt not entirely sure how she fell, reports feeling too weak to get  up off ground and son reports confusion today Initial BP on arrival in the 70s Pt would benefit from admission/monitoring D/w dr Orvan Falconer, to admit  Radiology: DG Chest 2 View. Reviewed by me. IMPRESSION: No acute cardiopulmonary process seen; no displaced rib fractures identified. Original Report Authenticated By: Tonia Ghent, M.D.  CT Head Wo Contrast.  IMPRESSION: 1. No acute intracranial or calvarial findings identified. 2. Postsurgical changes as described with chronic white matter disease. 3. Partial opacification of the mastoid air cells on the left. Original Report Authenticated By: Tonia Ghent, M.D.  CT Cervical Spine.  IMPRESSION: No evidence of acute cervical spine fracture or traumatic subluxation. No static signs of instability. Original Report Authenticated By: Gerrianne Scale, M.D.   LABS Results for orders placed during the hospital encounter of 03/21/11  BASIC METABOLIC PANEL      Component Value Range   Sodium 137  135 - 145 (mEq/L)   Potassium 3.7  3.5 - 5.1 (mEq/L)   Chloride 101  96 - 112 (mEq/L)   CO2 24  19 - 32 (mEq/L)   Glucose, Bld 127 (*) 70 - 99 (mg/dL)   BUN 15  6 - 23 (mg/dL)   Creatinine, Ser 1.61 (*) 0.50 - 1.10 (mg/dL)   Calcium 9.3  8.4 - 09.6 (mg/dL)   GFR calc non Af Amer 52 (*) >90 (mL/min)   GFR calc Af Amer 60 (*) >90 (mL/min)  CBC      Component Value Range   WBC 8.3  4.0 - 10.5 (K/uL)   RBC 4.28  3.87 - 5.11 (MIL/uL)   Hemoglobin 13.3  12.0 - 15.0 (g/dL)   HCT 04.5  40.9 - 81.1 (%)   MCV 92.1  78.0 - 100.0 (fL)   MCH 31.1  26.0 - 34.0 (pg)   MCHC 33.8  30.0 - 36.0 (g/dL)   RDW 91.4  78.2 - 95.6 (%)   Platelets 182  150 - 400 (K/uL)  DIFFERENTIAL      Component Value Range   Neutrophils Relative 85 (*) 43 - 77 (%)   Neutro Abs 7.1  1.7 - 7.7 (K/uL)   Lymphocytes Relative 7 (*) 12 - 46 (%)   Lymphs Abs 0.6 (*) 0.7 - 4.0 (K/uL)   Monocytes Relative 8  3 - 12 (%)   Monocytes Absolute 0.6  0.1 - 1.0 (K/uL)   Eosinophils Relative 0  0 - 5 (%)   Eosinophils Absolute 0.0  0.0 - 0.7 (K/uL)   Basophils Relative 1  0 - 1 (%)   Basophils Absolute 0.0  0.0 - 0.1 (K/uL)  POCT I-STAT TROPONIN I      Component Value Range   Troponin i, poc 0.00  0.00 - 0.08 (ng/mL)   Comment 3                 MDM  Nursing notes reviewed and considered in documentation All labs/vitals reviewed and considered xrays reviewed and considered       I personally performed the services described in this documentation, which was scribed in my presence. The recorded information has been reviewed and considered.          Joya Gaskins, MD 03/21/11 [phone]

## 2011-03-21 NOTE — ED Notes (Signed)
Larey Seat while getting newspaper this am, unable to recall any events after fall, family stated she has been "like a zombie" all day, are also worried that she may have taken wrong meds or to many, denies any pain. Has been sleeping all day.

## 2011-03-22 ENCOUNTER — Observation Stay (HOSPITAL_COMMUNITY): Payer: Medicare Other

## 2011-03-22 ENCOUNTER — Encounter (HOSPITAL_COMMUNITY): Payer: Self-pay | Admitting: Internal Medicine

## 2011-03-22 DIAGNOSIS — I959 Hypotension, unspecified: Secondary | ICD-10-CM | POA: Diagnosis present

## 2011-03-22 DIAGNOSIS — R55 Syncope and collapse: Secondary | ICD-10-CM | POA: Diagnosis present

## 2011-03-22 DIAGNOSIS — E785 Hyperlipidemia, unspecified: Secondary | ICD-10-CM | POA: Diagnosis present

## 2011-03-22 DIAGNOSIS — Z859 Personal history of malignant neoplasm, unspecified: Secondary | ICD-10-CM

## 2011-03-22 DIAGNOSIS — R413 Other amnesia: Secondary | ICD-10-CM | POA: Diagnosis present

## 2011-03-22 LAB — HEPATIC FUNCTION PANEL
ALT: 22 U/L (ref 0–35)
AST: 20 U/L (ref 0–37)
Albumin: 3.6 g/dL (ref 3.5–5.2)
Alkaline Phosphatase: 42 U/L (ref 39–117)
Bilirubin, Direct: 0.1 mg/dL (ref 0.0–0.3)
Indirect Bilirubin: 0.2 mg/dL — ABNORMAL LOW (ref 0.3–0.9)
Total Bilirubin: 0.3 mg/dL (ref 0.3–1.2)
Total Protein: 6.1 g/dL (ref 6.0–8.3)

## 2011-03-22 LAB — CBC
HCT: 36.1 % (ref 36.0–46.0)
Hemoglobin: 12 g/dL (ref 12.0–15.0)
MCH: 31 pg (ref 26.0–34.0)
MCHC: 33.2 g/dL (ref 30.0–36.0)
MCV: 93.3 fL (ref 78.0–100.0)
Platelets: 165 10*3/uL (ref 150–400)
RBC: 3.87 MIL/uL (ref 3.87–5.11)
RDW: 13 % (ref 11.5–15.5)
WBC: 5.5 10*3/uL (ref 4.0–10.5)

## 2011-03-22 LAB — CARDIAC PANEL(CRET KIN+CKTOT+MB+TROPI)
CK, MB: 2 ng/mL (ref 0.3–4.0)
CK, MB: 2.1 ng/mL (ref 0.3–4.0)
Relative Index: 1.2 (ref 0.0–2.5)
Relative Index: 1 (ref 0.0–2.5)
Total CK: 171 U/L (ref 7–177)
Total CK: 215 U/L — ABNORMAL HIGH (ref 7–177)
Troponin I: <0.3 ng/mL (ref <0.30)
Troponin I: <0.3 ng/mL (ref <0.30)

## 2011-03-22 LAB — BASIC METABOLIC PANEL
BUN: 13 mg/dL (ref 6–23)
CO2: 25 mEq/L (ref 19–32)
Calcium: 8.4 mg/dL (ref 8.4–10.5)
Chloride: 106 mEq/L (ref 96–112)
Creatinine, Ser: 1.1 mg/dL (ref 0.50–1.10)
GFR calc Af Amer: 63 mL/min — ABNORMAL LOW (ref >90)
GFR calc non Af Amer: 54 mL/min — ABNORMAL LOW (ref >90)
Glucose, Bld: 99 mg/dL (ref 70–99)
Potassium: 3.6 mEq/L (ref 3.5–5.1)
Sodium: 141 mEq/L (ref 135–145)

## 2011-03-22 LAB — LIPID PANEL
Cholesterol: 113 mg/dL (ref 0–200)
HDL: 49 mg/dL (ref >39)
LDL Cholesterol: 46 mg/dL (ref 0–99)
Total CHOL/HDL Ratio: 2.3 RATIO
Triglycerides: 91 mg/dL (ref <150)
VLDL: 18 mg/dL (ref 0–40)

## 2011-03-22 LAB — TSH: TSH: 0.504 u[IU]/mL (ref 0.350–4.500)

## 2011-03-22 LAB — MAGNESIUM: Magnesium: 2 mg/dL (ref 1.5–2.5)

## 2011-03-22 MED ORDER — ZOLPIDEM TARTRATE 5 MG PO TABS: 5.0000 mg | ORAL_TABLET | Freq: Every evening | ORAL | Status: DC | PRN

## 2011-03-22 MED ORDER — GADOBENATE DIMEGLUMINE 529 MG/ML IV SOLN
20.0000 mL | Freq: Once | INTRAVENOUS | Status: AC | PRN
  Administered 2011-03-22: 20 mL via INTRAVENOUS

## 2011-03-22 MED ORDER — ONDANSETRON HCL 4 MG PO TABS: 4.0000 mg | ORAL_TABLET | Freq: Four times a day (QID) | ORAL | Status: DC | PRN

## 2011-03-22 MED ORDER — ROSUVASTATIN CALCIUM 20 MG PO TABS: 10.0000 mg | ORAL_TABLET | Freq: Every day | ORAL | Status: DC

## 2011-03-22 MED ORDER — POTASSIUM CHLORIDE IN NACL 20-0.9 MEQ/L-% IV SOLN
INTRAVENOUS | Status: DC
  Administered 2011-03-22 (×2): via INTRAVENOUS

## 2011-03-22 MED ORDER — ONDANSETRON HCL 4 MG/2ML IJ SOLN: 4.0000 mg | Freq: Four times a day (QID) | INTRAMUSCULAR | Status: DC | PRN

## 2011-03-22 MED ORDER — ASPIRIN EC 81 MG PO TBEC
81.0000 mg | DELAYED_RELEASE_TABLET | Freq: Every day | ORAL | Status: DC
  Administered 2011-03-22: 81 mg via ORAL
  Filled 2011-03-22: qty 1

## 2011-03-22 MED ORDER — ENOXAPARIN SODIUM 40 MG/0.4ML ~~LOC~~ SOLN
40.0000 mg | SUBCUTANEOUS | Status: DC
  Administered 2011-03-22: 40 mg via SUBCUTANEOUS
  Filled 2011-03-22: qty 0.4

## 2011-03-22 MED ORDER — ACETAMINOPHEN 325 MG PO TABS: 650.0000 mg | ORAL_TABLET | Freq: Four times a day (QID) | ORAL | Status: DC | PRN

## 2011-03-22 MED ORDER — ACETAMINOPHEN 650 MG RE SUPP: 650.0000 mg | Freq: Four times a day (QID) | RECTAL | Status: DC | PRN

## 2011-03-22 NOTE — Progress Notes (Signed)
CARE MANAGEMENT NOTE 03/22/2011  Patient:  Jamie [name], Jamie [name]   Account Number:  [id number]  Date Initiated:  03/22/2011  Documentation initiated by:  Mendel Corning  Subjective/Objective Assessment:   60 YR OLD FEMALES/P FALL  HX OF LUNG AND BRAIN CANCER. LIVES WITH HER SON. CM SPOKE TO PT AND SON ANTICIPATE NO D/C NEEDS. CM TO FOLLOW     Action/Plan:   Anticipated DC Date:  03/22/2011   Anticipated DC Plan:  HOME/SELF CARE      DC Planning Services  CM consult      Choice offered to / List presented to:             Status of service:   Medicare Important Message given?  NA - LOS <3 / Initial given by admissions (If response is "NO", the following Medicare IM given date fields will be blank) Date Medicare IM given:   Date Additional Medicare IM given:    Discharge Disposition:  HOME/SELF CARE  Per UR Regulation:    Comments:

## 2011-03-22 NOTE — H&P (Signed)
PCP:   Horald Pollen., PA, PA   Chief Complaint:  Fall this afternoon  HPI: Jamie [name] is an 60 y.o. Caucasian  female.   Past history of lung cancer and brain cancer, details unclear apparently now in remission for many years. But has memory impairment status post radiation therapy to the brain. Lives in the same home with her son. Went outside to get the newspaper yesterday, somehow fell when she was coming back into the house, does not know how she fell heavily on the ground onto neighbors came to assist her, and called her son who was inside the house.  Is brought to the emergency room by car and evaluated and found no external nor internal evidence of trauma; rising when she arrived to the emergency room her blood pressure was in the 70s she was alert and oriented. Her pressure normalized with IV fluids.  As of the unclear history of a fall and hypotension on arrival the hospitalist service was called to assist with management.  Is no history of fever cough or cold no chest pain shortness of breath or palpitations no dizziness. He has no known coronary artery disease.  Rewiew of Systems:  The patient denies anorexia, fever, weight loss,, vision loss, decreased hearing, hoarseness, chest pain,  dyspnea on exertion, peripheral edema, balance deficits, hemoptysis, abdominal pain, melena, hematochezia, severe indigestion/heartburn, hematuria, incontinence, genital sores, muscle weakness, suspicious skin lesions, transient blindness, difficulty walking, depression, unusual weight change, abnormal bleeding, enlarged lymph nodes, angioedema, and breast masses.    Past Medical History  Diagnosis Date  . Cancer   . Brain cancer   . Lung cancer     Past Surgical History  Procedure Date  . Brain surgery     Medications:  HOME MEDS: Prior to Admission medications   Medication Sig Start Date End Date Taking? Authorizing Provider  aspirin EC 81 MG tablet Take 81 mg by mouth daily.      Yes Historical Provider, MD  Calcium-Magnesium-Vitamin D (CALCIUM MAGNESIUM PO) Take 1 tablet by mouth daily.     Yes Historical Provider, MD  Eszopiclone (ESZOPICLONE) 3 MG TABS Take 3 mg by mouth at bedtime. Take immediately before bedtime    Yes Historical Provider, MD  rosuvastatin (CRESTOR) 10 MG tablet Take 10 mg by mouth at bedtime.     Yes Historical Provider, MD     Allergies:  Allergies  Allergen Reactions  . Sulfonamide Derivatives     REACTION: Hives, tongue swells    Social History:   reports that she has quit smoking. She does not have any smokeless tobacco history on file. She reports that she drinks alcohol. She reports that she does not use illicit drugs.  Family History: No family history on file.   Physical Exam: Filed Vitals:   03/21/11 2132 03/22/11 0017 03/22/11 0031 03/22/11 0103  BP: 107/65 109/62  117/78  Pulse:    82  Temp:   99.4 F (37.4 C) 99 F (37.2 C)  TempSrc:   Oral Oral  Resp:  20  16  Height:    5\' 6"  (1.676 m)  Weight:    119.7 kg (263 lb 14.3 oz)  SpO2: 99% 96%  95%   Blood pressure 117/78, pulse 82, temperature 99 F (37.2 C), temperature source Oral, resp. rate 16, height 5\' 6"  (1.676 m), weight 119.7 kg (263 lb 14.3 oz), SpO2 95.00%.  GEN:  Pleasant elderly Caucasian lady lying in the stretcher in no acute distress; cooperative  with exam PSYCH:  alert and oriented x2; does not appear anxious does not appear depressed; affect is normal. She appears slightly confused; did not realize she was in this hospital; admits to not remembering the details of her fall. HEENT: Mucous membranes pink and anicteric; PERRLA; EOM intact; no cervical lymphadenopathy nor thyromegaly or carotid bruit; no JVD; Breasts:: Not examined CHEST WALL: No tenderness CHEST: Normal respiration, clear to auscultation bilaterally HEART: Regular rate and rhythm; no murmurs rubs or gallops BACK: No kyphosis or scoliosis; no CVA tenderness ABDOMEN: Obese, soft  non-tender; no masses, no organomegaly, normal abdominal bowel sounds; no pannus; no intertriginous candida. Rectal Exam: Not done EXTREMITIES:; age-appropriate arthropathy of the hands and knees; no edema; no ulcerations. Genitalia: not examined PULSES: 2+ and symmetric SKIN: Normal hydration no rash or ulceration CNS: Cranial nerves 2-12 grossly intact no focal neurologic deficit   Labs & Imaging Results for orders placed during the hospital encounter of 03/21/11 (from the past 48 hour(s))  BASIC METABOLIC PANEL     Status: Abnormal   Collection Time   03/21/11  9:18 PM      Component Value Range Comment   Sodium 137  135 - 145 (mEq/L)    Potassium 3.7  3.5 - 5.1 (mEq/L)    Chloride 101  96 - 112 (mEq/L)    CO2 24  19 - 32 (mEq/L)    Glucose, Bld 127 (*) 70 - 99 (mg/dL)    BUN 15  6 - 23 (mg/dL)    Creatinine, Ser 1.61 (*) 0.50 - 1.10 (mg/dL)    Calcium 9.3  8.4 - 10.5 (mg/dL)    GFR calc non Af Amer 52 (*) >90 (mL/min)    GFR calc Af Amer 60 (*) >90 (mL/min)   CBC     Status: Normal   Collection Time   03/21/11  9:18 PM      Component Value Range Comment   WBC 8.3  4.0 - 10.5 (K/uL)    RBC 4.28  3.87 - 5.11 (MIL/uL)    Hemoglobin 13.3  12.0 - 15.0 (g/dL)    HCT 09.6  04.5 - 40.9 (%)    MCV 92.1  78.0 - 100.0 (fL)    MCH 31.1  26.0 - 34.0 (pg)    MCHC 33.8  30.0 - 36.0 (g/dL)    RDW 81.1  91.4 - 78.2 (%)    Platelets 182  150 - 400 (K/uL)   DIFFERENTIAL     Status: Abnormal   Collection Time   03/21/11  9:18 PM      Component Value Range Comment   Neutrophils Relative 85 (*) 43 - 77 (%)    Neutro Abs 7.1  1.7 - 7.7 (K/uL)    Lymphocytes Relative 7 (*) 12 - 46 (%)    Lymphs Abs 0.6 (*) 0.7 - 4.0 (K/uL)    Monocytes Relative 8  3 - 12 (%)    Monocytes Absolute 0.6  0.1 - 1.0 (K/uL)    Eosinophils Relative 0  0 - 5 (%)    Eosinophils Absolute 0.0  0.0 - 0.7 (K/uL)    Basophils Relative 1  0 - 1 (%)    Basophils Absolute 0.0  0.0 - 0.1 (K/uL)   POCT I-STAT TROPONIN I      Status: Normal   Collection Time   03/21/11  9:26 PM      Component Value Range Comment   Troponin i, poc 0.00  0.00 - 0.08 (ng/mL)  Comment 3             Dg Chest 2 View  03/21/2011  *RADIOLOGY REPORT*  Clinical Data: Status post fall; headache and neck pain.  Concern for chest injury.  CHEST - 2 VIEW  Comparison: Chest radiograph performed 07/31/2005, and CT of the chest performed 09/26/2007  Findings: The lungs are well-aerated and clear.  There is no evidence of focal opacification, pleural effusion or pneumothorax.  The heart is normal in size; prominence of the aortic arch relatively stable from 2007.  No acute osseous abnormalities are seen.  IMPRESSION: No acute cardiopulmonary process seen; no displaced rib fractures identified.  Original Report Authenticated By: Tonia Ghent, M.D.   Ct Head Wo Contrast  03/21/2011  *RADIOLOGY REPORT*  Clinical Data:  Headache and neck pain status post fall.  CT HEAD WITHOUT CONTRAST CT CERVICAL SPINE WITHOUT CONTRAST  Technique:  Multidetector CT imaging of the head and cervical spine was performed following the standard protocol without intravenous contrast.  Multiplanar CT image reconstructions of the cervical spine were also generated.  Comparison:  None available.  Report from prior study 02/07/2003.  CT HEAD  Findings: The patient is status post left frontal craniotomy. There is encephalomalacia within the left frontal lobe which was previously described.  There is also some subcortical low density in the right frontal lobe.  There is mild generalized atrophy.  No acute intracranial hemorrhage, mass lesion, brain edema or extra- axial fluid collection is demonstrated.  The visualized paranasal sinuses are clear.  There is partial mastoid opacification on the left.  The right mastoid air cells and middle ears are clear.  IMPRESSION:  1.  No acute intracranial or calvarial findings identified. 2.  Postsurgical changes as described with chronic white matter  disease. 3.  Partial opacification of the mastoid air cells on the left.  CT CERVICAL SPINE  Findings: The cervical alignment is normal.  There is no evidence of acute fracture or traumatic subluxation.  No acute soft tissue findings are demonstrated.  There is mild spondylosis with mild disc bulging throughout the cervical spine.  IMPRESSION: No evidence of acute cervical spine fracture or traumatic subluxation.  No static signs of instability.  Original Report Authenticated By: Gerrianne Scale, M.D.   Ct Cervical Spine Wo Contrast  03/21/2011  *RADIOLOGY REPORT*  Clinical Data:  Headache and neck pain status post fall.  CT HEAD WITHOUT CONTRAST CT CERVICAL SPINE WITHOUT CONTRAST  Technique:  Multidetector CT imaging of the head and cervical spine was performed following the standard protocol without intravenous contrast.  Multiplanar CT image reconstructions of the cervical spine were also generated.  Comparison:  None available.  Report from prior study 02/07/2003.  CT HEAD  Findings: The patient is status post left frontal craniotomy. There is encephalomalacia within the left frontal lobe which was previously described.  There is also some subcortical low density in the right frontal lobe.  There is mild generalized atrophy.  No acute intracranial hemorrhage, mass lesion, brain edema or extra- axial fluid collection is demonstrated.  The visualized paranasal sinuses are clear.  There is partial mastoid opacification on the left.  The right mastoid air cells and middle ears are clear.  IMPRESSION:  1.  No acute intracranial or calvarial findings identified. 2.  Postsurgical changes as described with chronic white matter disease. 3.  Partial opacification of the mastoid air cells on the left.  CT CERVICAL SPINE  Findings: The cervical alignment is normal.  There  is no evidence of acute fracture or traumatic subluxation.  No acute soft tissue findings are demonstrated.  There is mild spondylosis with mild disc  bulging throughout the cervical spine.  IMPRESSION: No evidence of acute cervical spine fracture or traumatic subluxation.  No static signs of instability.  Original Report Authenticated By: Gerrianne Scale, M.D.      Assessment Present on Admission:  .Syncope .Hypotension .Memory impairment   PLAN: Will bring this lady on observation for hydration and syncope workup. An MRI of the brain be sure there is no evidence of recurrence  Other plans as per orders.    Niyah Mamaril 03/22/2011, 1:52 AM

## 2011-03-22 NOTE — Discharge Summary (Signed)
DISCHARGE SUMMARY  Jamie [name]  MR#: [id number]  DOB:1950-07-11  Date of Admission: 03/21/2011 Date of Discharge: 03/22/2011  Attending Physician:[name],Jamie K  Patient's VHQ:[name],[name], PA, PA  Consults:  none  Discharge Diagnoses: Present on Admission:  .Syncope .Hypotension .Memory impairment .Hyperlipidemia    Current Discharge Medication List    CONTINUE these medications which have NOT CHANGED   Details  aspirin EC 81 MG tablet Take 81 mg by mouth daily.      Calcium-Magnesium-Vitamin D (CALCIUM MAGNESIUM PO) Take 1 tablet by mouth daily.      Eszopiclone (ESZOPICLONE) 3 MG TABS Take 3 mg by mouth at bedtime. Take immediately before bedtime     rosuvastatin (CRESTOR) 10 MG tablet Take 10 mg by mouth at bedtime.            Hospital Course: Present on Admission:  .Syncope: Patient is a 60 year old white female with past medical history of lung and brain cancer status post radiation therapy which has led to chronic memory impairment who presents to the emergency room after a syncopal episode. She fell outside while headache to get the newspaper and was down for a small amount of time but was not exactly clear how long. She could not remember the events surrounding this. When she came into the emergency room she was evaluated and found to have a lower blood pressure in the 80s, and had clinical signs consistent with dehydration. She was no signs of infection, electrolyte abnormalities or renal dysfunction. The patient was observed overnight and started on IV fluids at 100 cc an hour. Pressure since then has remained stable and steady systolic blood pressure in the 120s. An MRI was done which showed no signs of an acute stroke and carotid Doppler showed no signs of any significant carotid artery stenosis. It is not immediately clear as to the cause of the patient's dehydration to begin with. Again there no signs of infection, she's not on any antihypertensive  medications, and her albumin level on admission was 3.6 indicating that she was not malnourished. Regardless, she is feeling better and looks to be clinically stable. Plan to discharge home with followup with her PCP in the next one week  .Hypotension: As above.  .Memory impairment: Stable at baseline.  .Hyperlipidemia: Will continue on statin.   Day of Discharge BP 125/80  Pulse 79  Temp(Src) 99 F (37.2 C) (Oral)  Resp 18  Ht 5\' 6"  (1.676 m)  Wt 119.8 kg (264 lb 1.8 oz)  BMI 42.63 kg/m2  SpO2 95%  Physical Exam: General: Alert and oriented x2, which appears to be her baseline. Cardiovascular: Regular rate and rhythm S1-S2 Lungs auscultation bilaterally. Abdomen: Soft, nontender, nondistended positive bowel sounds Extremities show no clubbing cyanosis or edema  Results for orders placed during the hospital encounter of 03/21/11 (from the past 24 hour(s))  BASIC METABOLIC PANEL     Status: Abnormal   Collection Time   03/21/11  9:18 PM      Component Value Range   Sodium 137  135 - 145 (mEq/L)   Potassium 3.7  3.5 - 5.1 (mEq/L)   Chloride 101  96 - 112 (mEq/L)   CO2 24  19 - 32 (mEq/L)   Glucose, Bld 127 (*) 70 - 99 (mg/dL)   BUN 15  6 - 23 (mg/dL)   Creatinine, Ser 3.24 (*) 0.50 - 1.10 (mg/dL)   Calcium 9.3  8.4 - 40.1 (mg/dL)   GFR calc non Af Amer 52 (*) >90 (  mL/min)   GFR calc Af Amer 60 (*) >90 (mL/min)  CBC     Status: Normal   Collection Time   03/21/11  9:18 PM      Component Value Range   WBC 8.3  4.0 - 10.5 (K/uL)   RBC 4.28  3.87 - 5.11 (MIL/uL)   Hemoglobin 13.3  12.0 - 15.0 (g/dL)   HCT 47.8  29.5 - 62.1 (%)   MCV 92.1  78.0 - 100.0 (fL)   MCH 31.1  26.0 - 34.0 (pg)   MCHC 33.8  30.0 - 36.0 (g/dL)   RDW 30.8  65.7 - 84.6 (%)   Platelets 182  150 - 400 (K/uL)  DIFFERENTIAL     Status: Abnormal   Collection Time   03/21/11  9:18 PM      Component Value Range   Neutrophils Relative 85 (*) 43 - 77 (%)   Neutro Abs 7.1  1.7 - 7.7 (K/uL)   Lymphocytes  Relative 7 (*) 12 - 46 (%)   Lymphs Abs 0.6 (*) 0.7 - 4.0 (K/uL)   Monocytes Relative 8  3 - 12 (%)   Monocytes Absolute 0.6  0.1 - 1.0 (K/uL)   Eosinophils Relative 0  0 - 5 (%)   Eosinophils Absolute 0.0  0.0 - 0.7 (K/uL)   Basophils Relative 1  0 - 1 (%)   Basophils Absolute 0.0  0.0 - 0.1 (K/uL)  POCT I-STAT TROPONIN I     Status: Normal   Collection Time   03/21/11  9:26 PM      Component Value Range   Troponin i, poc 0.00  0.00 - 0.08 (ng/mL)   Comment 3           HEPATIC FUNCTION PANEL     Status: Abnormal   Collection Time   03/22/11  4:37 AM      Component Value Range   Total Protein 6.1  6.0 - 8.3 (g/dL)   Albumin 3.6  3.5 - 5.2 (g/dL)   AST 20  0 - 37 (U/L)   ALT 22  0 - 35 (U/L)   Alkaline Phosphatase 42  39 - 117 (U/L)   Total Bilirubin 0.3  0.3 - 1.2 (mg/dL)   Bilirubin, Direct 0.1  0.0 - 0.3 (mg/dL)   Indirect Bilirubin 0.2 (*) 0.3 - 0.9 (mg/dL)  MAGNESIUM     Status: Normal   Collection Time   03/22/11  4:37 AM      Component Value Range   Magnesium 2.0  1.5 - 2.5 (mg/dL)  BASIC METABOLIC PANEL     Status: Abnormal   Collection Time   03/22/11  4:42 AM      Component Value Range   Sodium 141  135 - 145 (mEq/L)   Potassium 3.6  3.5 - 5.1 (mEq/L)   Chloride 106  96 - 112 (mEq/L)   CO2 25  19 - 32 (mEq/L)   Glucose, Bld 99  70 - 99 (mg/dL)   BUN 13  6 - 23 (mg/dL)   Creatinine, Ser 9.62  0.50 - 1.10 (mg/dL)   Calcium 8.4  8.4 - 95.2 (mg/dL)   GFR calc non Af Amer 54 (*) >90 (mL/min)   GFR calc Af Amer 63 (*) >90 (mL/min)  CBC     Status: Normal   Collection Time   03/22/11  4:42 AM      Component Value Range   WBC 5.5  4.0 - 10.5 (K/uL)   RBC 3.87  3.87 - 5.11 (MIL/uL)   Hemoglobin 12.0  12.0 - 15.0 (g/dL)   HCT 54.0  98.1 - 19.1 (%)   MCV 93.3  78.0 - 100.0 (fL)   MCH 31.0  26.0 - 34.0 (pg)   MCHC 33.2  30.0 - 36.0 (g/dL)   RDW 47.8  29.5 - 62.1 (%)   Platelets 165  150 - 400 (K/uL)  LIPID PANEL     Status: Normal   Collection Time   03/22/11  4:43  AM      Component Value Range   Cholesterol 113  0 - 200 (mg/dL)   Triglycerides 91  <308 (mg/dL)   HDL 49  >65 (mg/dL)   Total CHOL/HDL Ratio 2.3     VLDL 18  0 - 40 (mg/dL)   LDL Cholesterol 46  0 - 99 (mg/dL)  CARDIAC PANEL(CRET KIN+CKTOT+MB+TROPI)     Status: Normal   Collection Time   03/22/11  7:50 AM      Component Value Range   Total CK 171  7 - 177 (U/L)   CK, MB 2.0  0.3 - 4.0 (ng/mL)   Troponin I <0.30  <0.30 (ng/mL)   Relative Index 1.2  0.0 - 2.5     Disposition: Improved, being discharged home   Follow-up Appts: Discharge Orders    Future Orders Please Complete By Expires   Diet - low sodium heart healthy      Increase activity slowly         Follow-up with Dr. Caryn Bee, PCP in 1 weeks.   Tests Needing Follow-up: None  Signed: KRISHNAN,Jamie K 03/22/2011, 2:03 PM

## 2011-03-22 NOTE — Progress Notes (Signed)
Discharge instructions given with understanding stated ivf d/ced site wnl.

## 2011-03-22 NOTE — Progress Notes (Signed)
*  PRELIMINARY RESULTS* Echocardiogram 2D Echocardiogram has been performed.  Mckade Gurka Jane 03/22/2011, 3:06 PM 

## 2011-03-22 NOTE — Progress Notes (Signed)
Brief initial visit with patient for support.

## 2011-03-22 NOTE — Progress Notes (Signed)
UR Chart Review Completed  

## 2011-09-08 ENCOUNTER — Emergency Department (HOSPITAL_COMMUNITY)
Admission: EM | Admit: 2011-09-08 | Discharge: 2011-09-08 | Disposition: A | Discharge status: Home / Self Care | Payer: Medicare Other | Attending: Emergency Medicine | Admitting: Emergency Medicine

## 2011-09-08 ENCOUNTER — Emergency Department (HOSPITAL_COMMUNITY): Payer: Medicare Other

## 2011-09-08 ENCOUNTER — Encounter (HOSPITAL_COMMUNITY): Payer: Self-pay | Admitting: Emergency Medicine

## 2011-09-08 DIAGNOSIS — J3489 Other specified disorders of nose and nasal sinuses: Secondary | ICD-10-CM | POA: Insufficient documentation

## 2011-09-08 DIAGNOSIS — Z7982 Long term (current) use of aspirin: Secondary | ICD-10-CM | POA: Insufficient documentation

## 2011-09-08 DIAGNOSIS — R05 Cough: Secondary | ICD-10-CM | POA: Insufficient documentation

## 2011-09-08 DIAGNOSIS — J40 Bronchitis, not specified as acute or chronic: Secondary | ICD-10-CM | POA: Insufficient documentation

## 2011-09-08 DIAGNOSIS — Z87891 Personal history of nicotine dependence: Secondary | ICD-10-CM | POA: Insufficient documentation

## 2011-09-08 DIAGNOSIS — Z85841 Personal history of malignant neoplasm of brain: Secondary | ICD-10-CM | POA: Insufficient documentation

## 2011-09-08 DIAGNOSIS — F29 Unspecified psychosis not due to a substance or known physiological condition: Secondary | ICD-10-CM | POA: Insufficient documentation

## 2011-09-08 DIAGNOSIS — IMO0001 Reserved for inherently not codable concepts without codable children: Secondary | ICD-10-CM | POA: Insufficient documentation

## 2011-09-08 DIAGNOSIS — R059 Cough, unspecified: Secondary | ICD-10-CM | POA: Insufficient documentation

## 2011-09-08 DIAGNOSIS — E7800 Pure hypercholesterolemia, unspecified: Secondary | ICD-10-CM | POA: Insufficient documentation

## 2011-09-08 DIAGNOSIS — Z85118 Personal history of other malignant neoplasm of bronchus and lung: Secondary | ICD-10-CM | POA: Insufficient documentation

## 2011-09-08 HISTORY — DX: Pure hypercholesterolemia, unspecified: E78.00

## 2011-09-08 LAB — DIFFERENTIAL
Basophils Absolute: 0 10*3/uL (ref 0.0–0.1)
Basophils Relative: 0 % (ref 0–1)
Eosinophils Absolute: 0.1 10*3/uL (ref 0.0–0.7)
Eosinophils Relative: 1 % (ref 0–5)
Lymphocytes Relative: 9 % — ABNORMAL LOW (ref 12–46)
Lymphs Abs: 0.7 10*3/uL (ref 0.7–4.0)
Monocytes Absolute: 0.8 10*3/uL (ref 0.1–1.0)
Monocytes Relative: 11 % (ref 3–12)
Neutro Abs: 6.2 10*3/uL (ref 1.7–7.7)
Neutrophils Relative %: 79 % — ABNORMAL HIGH (ref 43–77)

## 2011-09-08 LAB — URINALYSIS, ROUTINE W REFLEX MICROSCOPIC
Bilirubin Urine: NEGATIVE
Glucose, UA: NEGATIVE mg/dL
Ketones, ur: NEGATIVE mg/dL
Nitrite: NEGATIVE
Protein, ur: NEGATIVE mg/dL
Specific Gravity, Urine: 1.02 (ref 1.005–1.030)
Urobilinogen, UA: 0.2 mg/dL (ref 0.0–1.0)
pH: 6.5 (ref 5.0–8.0)

## 2011-09-08 LAB — URINE MICROSCOPIC-ADD ON

## 2011-09-08 LAB — CBC
HCT: 37.5 % (ref 36.0–46.0)
Hemoglobin: 12.6 g/dL (ref 12.0–15.0)
MCH: 30.5 pg (ref 26.0–34.0)
MCHC: 33.6 g/dL (ref 30.0–36.0)
MCV: 90.8 fL (ref 78.0–100.0)
Platelets: 189 10*3/uL (ref 150–400)
RBC: 4.13 MIL/uL (ref 3.87–5.11)
RDW: 12.3 % (ref 11.5–15.5)
WBC: 7.8 10*3/uL (ref 4.0–10.5)

## 2011-09-08 LAB — BASIC METABOLIC PANEL
BUN: 14 mg/dL (ref 6–23)
CO2: 26 mEq/L (ref 19–32)
Calcium: 9.5 mg/dL (ref 8.4–10.5)
Chloride: 102 mEq/L (ref 96–112)
Creatinine, Ser: 1.17 mg/dL — ABNORMAL HIGH (ref 0.50–1.10)
GFR calc Af Amer: 58 mL/min — ABNORMAL LOW (ref >90)
GFR calc non Af Amer: 50 mL/min — ABNORMAL LOW (ref >90)
Glucose, Bld: 103 mg/dL — ABNORMAL HIGH (ref 70–99)
Potassium: 3.7 mEq/L (ref 3.5–5.1)
Sodium: 138 mEq/L (ref 135–145)

## 2011-09-08 MED ORDER — SODIUM CHLORIDE 0.9 % IV BOLUS (SEPSIS)
1000.0000 mL | Freq: Once | INTRAVENOUS | Status: AC
  Administered 2011-09-08: 1000 mL via INTRAVENOUS

## 2011-09-08 MED ORDER — AZITHROMYCIN 250 MG PO TABS: ORAL_TABLET | ORAL | Status: AC

## 2011-09-08 MED ORDER — ONDANSETRON HCL 4 MG/2ML IJ SOLN: 4.0000 mg | Freq: Once | INTRAMUSCULAR | Status: DC

## 2011-09-08 NOTE — ED Provider Notes (Signed)
History   This chart was scribed for Donnetta Hutching, MD by Jamie [name]. The patient was seen in room APA10/[facility]10. Patient's care was started at 1011.    CSN: [id number]  Arrival date & time 09/08/11  1011   First MD Initiated Contact with Patient 09/08/11 1037      Chief Complaint  Patient presents with  . Weakness  . Generalized Body Aches  . Altered Mental Status    (Consider location/radiation/quality/duration/timing/severity/associated sxs/prior treatment) HPI Jamie [name] is a 61 y.o. female who presents to the Emergency Department accompanied by caregiver who states patient with constant mild to moderate increased confusion onset this morning and gradually improving since with associated cough, congestion, rhinorrhea and myalgias. Patient reports she began experiencing cough with associated rhinorrhea and myalgias 2 days ago and states symptoms have been gradually worsening since. Denies chest pain, SOB, nausea, vomiting, diarrhea, sore throat, abdominal pain. Patient's caregiver reports patient has been diagnosed with pneumonia multiple times within the past 6 months. Patient with h/o pneumonia, brain and lung CA, high cholesterol and is a former smoker.   PCP- [place] Charon  Past Medical History  Diagnosis Date  . Cancer   . Brain cancer   . Lung cancer   . High cholesterol     Past Surgical History  Procedure Date  . Brain surgery     No family history on file.  History  Substance Use Topics  . Smoking status: Former Games developer  . Smokeless tobacco: Not on file  . Alcohol Use: Yes     occ    OB History    Grav Para Term Preterm Abortions TAB SAB Ect Mult Living                  Review of Systems A complete 10 system review of systems was obtained and all systems are negative except as noted in the HPI and PMH.   Allergies  Sulfonamide derivatives  Home Medications   Current Outpatient Rx  Name Route Sig Dispense Refill  . ASPIRIN EC 81 MG PO TBEC Oral Take 81  mg by mouth daily.      Marland Kitchen CALCIUM MAGNESIUM PO Oral Take 1 tablet by mouth daily.      Marland Kitchen ESZOPICLONE 3 MG PO TABS Oral Take 3 mg by mouth at bedtime. Take immediately before bedtime     . FENOFIBRATE 160 MG PO TABS Oral Take 160 mg by mouth daily.    Marland Kitchen ROSUVASTATIN CALCIUM 10 MG PO TABS Oral Take 10 mg by mouth at bedtime.        BP 122/75  Pulse 91  Temp 98.3 F (36.8 C)  Resp 20  Ht 5\' 5"  (1.651 m)  Wt 170 lb (77.111 kg)  BMI 28.29 kg/m2  SpO2 98%  Physical Exam  Nursing note and vitals reviewed. Constitutional: She is oriented to person, place, and time. She appears well-developed. No distress.       Appears slightly dehydrated.   HENT:  Head: Normocephalic and atraumatic.  Eyes: EOM are normal. Pupils are equal, round, and reactive to light.  Neck: Neck supple. No tracheal deviation present.  Cardiovascular: Normal rate and regular rhythm.  Exam reveals no gallop and no friction rub.   No murmur heard. Pulmonary/Chest: Effort normal. No respiratory distress. She has no wheezes.  Abdominal: Soft. She exhibits no distension. There is no tenderness.  Musculoskeletal: Normal range of motion. She exhibits no edema.  Neurological: She is alert and oriented to person,  place, and time. No sensory deficit.       Patient oriented to person, place and time.   Skin: Skin is warm and dry.  Psychiatric: She has a normal mood and affect. Her behavior is normal.    ED Course  Procedures (including critical care time)  DIAGNOSTIC STUDIES: Oxygen Saturation is 98% on room air, normal by my interpretation.    COORDINATION OF CARE: 11:42AM-Patient and patient's caregiver informed of current plan for treatment and evaluation and agrees with plan at this time.     Labs Reviewed  DIFFERENTIAL - Abnormal; Notable for the following:    Neutrophils Relative 79 (*)    Lymphocytes Relative 9 (*)    All other components within normal limits  BASIC METABOLIC PANEL - Abnormal; Notable for  the following:    Glucose, Bld 103 (*)    Creatinine, Ser 1.17 (*)    GFR calc non Af Amer 50 (*)    GFR calc Af Amer 58 (*)    All other components within normal limits  URINALYSIS, ROUTINE W REFLEX MICROSCOPIC - Abnormal; Notable for the following:    Hgb urine dipstick MODERATE (*)    Leukocytes, UA MODERATE (*)    All other components within normal limits  URINE MICROSCOPIC-ADD ON - Abnormal; Notable for the following:    Bacteria, UA MANY (*)    All other components within normal limits  CBC   Dg Chest 2 View  09/08/2011  *RADIOLOGY REPORT*  Clinical Data: Cough, body aches and rhinorrhea.  CHEST - 2 VIEW  Comparison: 03/21/2011.  Findings: Patient is slightly rotated.  Trachea is midline.  Heart size normal.  There are postoperative changes at the right lung base.  Lungs are otherwise clear.  No pleural fluid.  IMPRESSION: Postoperative changes at the right lung base.  No acute findings.  Original Report Authenticated By: Reyes Ivan, M.D.     No diagnosis found.    MDM  Patient complains of bad chest cold.  Will Rx Zithromax. Clinical findings suggestive of early pneumonia       I personally performed the services described in this documentation, which was scribed in my presence. The recorded information has been reviewed and considered.    Donnetta Hutching, MD 09/08/11 1335

## 2011-09-08 NOTE — ED Notes (Signed)
Pt c/o cough since Monday with increasing weakness/confusion/body aches.

## 2011-09-08 NOTE — Discharge Instructions (Signed)
Bronchitis Bronchitis is a problem of the air tubes leading to your lungs. This problem makes it hard for air to get in and out of the lungs. You may cough a lot because your air tubes are narrow. Going without care can cause lasting (chronic) bronchitis. HOME CARE   Drink enough fluids to keep your pee (urine) clear or pale yellow.   Use a cool mist humidifier.   Quit smoking if you smoke. If you keep smoking, the bronchitis might not get better.   Only take medicine as told by your doctor.  GET HELP RIGHT AWAY IF:   Coughing keeps you awake.   You start to wheeze.   You become more sick or weak.   You have a hard time breathing or get short of breath.   You cough up blood.   Coughing lasts more than 2 weeks.   You have a fever.   Your baby is older than 3 months with a rectal temperature of 102 F (38.9 C) or higher.   Your baby is 48 months old or younger with a rectal temperature of 100.4 F (38 C) or higher.  MAKE SURE YOU:  Understand these instructions.   Will watch your condition.   Will get help right away if you are not doing well or get worse.  Document Released: 09/22/2007 Document Revised: 03/25/2011 Document Reviewed: 03/07/2009 [facility] Patient Information 2012 [place], [place].  X-ray shows no pneumonia. Will start antibiotics. Increase fluids. Rest. Tylenol for fever and pain

## 2012-07-24 ENCOUNTER — Other Ambulatory Visit: Payer: Self-pay | Admitting: *Deleted

## 2012-07-24 MED ORDER — FLUCONAZOLE 150 MG PO TABS: 150.0000 mg | ORAL_TABLET | Freq: Once | ORAL | Status: DC

## 2012-07-24 NOTE — Telephone Encounter (Signed)
Patient requesting refill on diflucan. Last seen 03-28-12. Last received on 07-27-10. Please advise

## 2012-07-24 NOTE — Telephone Encounter (Signed)
Okay to refill x1 the diflucan. Due soon for follow up

## 2012-07-27 ENCOUNTER — Ambulatory Visit (INDEPENDENT_AMBULATORY_CARE_PROVIDER_SITE_OTHER): Payer: Medicare Other | Admitting: Family Medicine

## 2012-07-27 ENCOUNTER — Encounter: Payer: Self-pay | Admitting: Family Medicine

## 2012-07-27 VITALS — BP 112/70 | HR 75 | Temp 97.0°F | Ht 66.0 in | Wt 170.8 lb

## 2012-07-27 DIAGNOSIS — N183 Chronic kidney disease, stage 3 unspecified: Secondary | ICD-10-CM | POA: Insufficient documentation

## 2012-07-27 DIAGNOSIS — N189 Chronic kidney disease, unspecified: Secondary | ICD-10-CM

## 2012-07-27 DIAGNOSIS — E785 Hyperlipidemia, unspecified: Secondary | ICD-10-CM

## 2012-07-27 DIAGNOSIS — R413 Other amnesia: Secondary | ICD-10-CM

## 2012-07-27 DIAGNOSIS — Z859 Personal history of malignant neoplasm, unspecified: Secondary | ICD-10-CM

## 2012-07-27 LAB — HEPATIC FUNCTION PANEL
ALT: 21 U/L (ref 0–35)
AST: 22 U/L (ref 0–37)
Albumin: 4.9 g/dL (ref 3.5–5.2)
Alkaline Phosphatase: 36 U/L — ABNORMAL LOW (ref 39–117)
Bilirubin, Direct: 0.1 mg/dL (ref 0.0–0.3)
Indirect Bilirubin: 0.3 mg/dL (ref 0.0–0.9)
Total Bilirubin: 0.4 mg/dL (ref 0.3–1.2)
Total Protein: 6.8 g/dL (ref 6.0–8.3)

## 2012-07-27 LAB — BASIC METABOLIC PANEL WITH GFR
BUN: 16 mg/dL (ref 6–23)
CO2: 27 mEq/L (ref 19–32)
Calcium: 9.8 mg/dL (ref 8.4–10.5)
Chloride: 106 mEq/L (ref 96–112)
Creat: 1.2 mg/dL — ABNORMAL HIGH (ref 0.50–1.10)
GFR, Est African American: 57 mL/min — ABNORMAL LOW
GFR, Est Non African American: 50 mL/min — ABNORMAL LOW
Glucose, Bld: 81 mg/dL (ref 70–99)
Potassium: 4.2 mEq/L (ref 3.5–5.3)
Sodium: 143 mEq/L (ref 135–145)

## 2012-07-27 LAB — TSH: TSH: 1.458 u[IU]/mL (ref 0.350–4.500)

## 2012-07-27 NOTE — Assessment & Plan Note (Signed)
So far no signs of recurrence. Stable.

## 2012-07-27 NOTE — Patient Instructions (Signed)
Diet and Exercise discussed with patient. For nutrition information, I recommend books: Eat to Live by Dr Joel Fuhrman. Prevent and Reverse Heart Disease by Dr Caldwell Esselstyn.  Exercise recommendations are:  If unable to walk, then the patient can exercise in a chair 3 times a day. By flapping arms like a bird gently and raising legs outwards to the front.  If ambulatory, the patient can go for walks for 30 minutes 3 times a week. Then increase the intensity and duration as tolerated. Goal is to try to attain exercise frequency to 5 times a week. Best to perform resistance exercises 2 days a week and cardio type exercises 3 days per week. Hypertriglyceridemia  Diet for High blood levels of Triglycerides Most fats in food are triglycerides. Triglycerides in your blood are stored as fat in your body. High levels of triglycerides in your blood may put you at a greater risk for heart disease and stroke.  Normal triglyceride levels are less than 150 mg/dL. Borderline high levels are 150-199 mg/dl. High levels are 200 - 499 mg/dL, and very high triglyceride levels are greater than 500 mg/dL. The decision to treat high triglycerides is generally based on the level. For people with borderline or high triglyceride levels, treatment includes weight loss and exercise. Drugs are recommended for people with very high triglyceride levels. Many people who need treatment for high triglyceride levels have metabolic syndrome. This syndrome is a collection of disorders that often include: insulin resistance, high blood pressure, blood clotting problems, high cholesterol and triglycerides. TESTING PROCEDURE FOR TRIGLYCERIDES  You should not eat 4 hours before getting your triglycerides measured. The normal range of triglycerides is between 10 and 250 milligrams per deciliter (mg/dl). Some people may have extreme levels (1000 or above), but your triglyceride level may be too high if it is above 150 mg/dl, depending  on what other risk factors you have for heart disease.  People with high blood triglycerides may also have high blood cholesterol levels. If you have high blood cholesterol as well as high blood triglycerides, your risk for heart disease is probably greater than if you only had high triglycerides. High blood cholesterol is one of the main risk factors for heart disease. CHANGING YOUR DIET  Your weight can affect your blood triglyceride level. If you are more than 20% above your ideal body weight, you may be able to lower your blood triglycerides by losing weight. Eating less and exercising regularly is the best way to combat this. Fat provides more calories than any other food. The best way to lose weight is to eat less fat. Only 30% of your total calories should come from fat. Less than 7% of your diet should come from saturated fat. A diet low in fat and saturated fat is the same as a diet to decrease blood cholesterol. By eating a diet lower in fat, you may lose weight, lower your blood cholesterol, and lower your blood triglyceride level.  Eating a diet low in fat, especially saturated fat, may also help you lower your blood triglyceride level. Ask your dietitian to help you figure how much fat you can eat based on the number of calories your caregiver has prescribed for you.  Exercise, in addition to helping with weight loss may also help lower triglyceride levels.   Alcohol can increase blood triglycerides. You may need to stop drinking alcoholic beverages.  Too much carbohydrate in your diet may also increase your blood triglycerides. Some complex carbohydrates are necessary   in your diet. These may include bread, rice, potatoes, other starchy vegetables and cereals.  Reduce "simple" carbohydrates. These may include pure sugars, candy, honey, and jelly without losing other nutrients. If you have the kind of high blood triglycerides that is affected by the amount of carbohydrates in your diet, you  will need to eat less sugar and less high-sugar foods. Your caregiver can help you with this.  Adding 2-4 grams of fish oil (EPA+ DHA) may also help lower triglycerides. Speak with your caregiver before adding any supplements to your regimen. Following the Diet  Maintain your ideal weight. Your caregivers can help you with a diet. Generally, eating less food and getting more exercise will help you lose weight. Joining a weight control group may also help. Ask your caregivers for a good weight control group in your area.  Eat low-fat foods instead of high-fat foods. This can help you lose weight too.  These foods are lower in fat. Eat MORE of these:   Dried beans, peas, and lentils.  Egg whites.  Low-fat cottage cheese.  Fish.  Lean cuts of meat, such as round, sirloin, rump, and flank (cut extra fat off meat you fix).  Whole grain breads, cereals and pasta.  Skim and nonfat dry milk.  Low-fat yogurt.  Poultry without the skin.  Cheese made with skim or part-skim milk, such as mozzarella, parmesan, farmers', ricotta, or pot cheese. These are higher fat foods. Eat LESS of these:   Whole milk and foods made from whole milk, such as American, blue, cheddar, monterey jack, and swiss cheese  High-fat meats, such as luncheon meats, sausages, knockwurst, bratwurst, hot dogs, ribs, corned beef, ground pork, and regular ground beef.  Fried foods. Limit saturated fats in your diet. Substituting unsaturated fat for saturated fat may decrease your blood triglyceride level. You will need to read package labels to know which products contain saturated fats.  These foods are high in saturated fat. Eat LESS of these:   Fried pork skins.  Whole milk.  Skin and fat from poultry.  Palm oil.  Butter.  Shortening.  Cream cheese.  Bacon.  Margarines and baked goods made from listed oils.  Vegetable shortenings.  Chitterlings.  Fat from meats.  Coconut oil.  Palm kernel  oil.  Lard.  Cream.  Sour cream.  Fatback.  Coffee whiteners and non-dairy creamers made with these oils.  Cheese made from whole milk. Use unsaturated fats (both polyunsaturated and monounsaturated) moderately. Remember, even though unsaturated fats are better than saturated fats; you still want a diet low in total fat.  These foods are high in unsaturated fat:   Canola oil.  Sunflower oil.  Mayonnaise.  Almonds.  Peanuts.  Pine nuts.  Margarines made with these oils.  Safflower oil.  Olive oil.  Avocados.  Cashews.  Peanut butter.  Sunflower seeds.  Soybean oil.  Peanut oil.  Olives.  Pecans.  Walnuts.  Pumpkin seeds. Avoid sugar and other high-sugar foods. This will decrease carbohydrates without decreasing other nutrients. Sugar in your food goes rapidly to your blood. When there is excess sugar in your blood, your liver may use it to make more triglycerides. Sugar also contains calories without other important nutrients.  Eat LESS of these:   Sugar, brown sugar, powdered sugar, jam, jelly, preserves, honey, syrup, molasses, pies, candy, cakes, cookies, frosting, pastries, colas, soft drinks, punches, fruit drinks, and regular gelatin.  Avoid alcohol. Alcohol, even more than sugar, may increase blood triglycerides. In addition,   alcohol is high in calories and low in nutrients. Ask for sparkling water, or a diet soft drink instead of an alcoholic beverage. Suggestions for planning and preparing meals   Bake, broil, grill or roast meats instead of frying.  Remove fat from meats and skin from poultry before cooking.  Add spices, herbs, lemon juice or vinegar to vegetables instead of salt, rich sauces or gravies.  Use a non-stick skillet without fat or use no-stick sprays.  Cool and refrigerate stews and broth. Then remove the hardened fat floating on the surface before serving.  Refrigerate meat drippings and skim off fat to make low-fat  gravies.  Serve more fish.  Use less butter, margarine and other high-fat spreads on bread or vegetables.  Use skim or reconstituted non-fat dry milk for cooking.  Cook with low-fat cheeses.  Substitute low-fat yogurt or cottage cheese for all or part of the sour cream in recipes for sauces, dips or congealed salads.  Use half yogurt/half mayonnaise in salad recipes.  Substitute evaporated skim milk for cream. Evaporated skim milk or reconstituted non-fat dry milk can be whipped and substituted for whipped cream in certain recipes.  Choose fresh fruits for dessert instead of high-fat foods such as pies or cakes. Fruits are naturally low in fat. When Dining Out   Order low-fat appetizers such as fruit or vegetable juice, pasta with vegetables or tomato sauce.  Select clear, rather than cream soups.  Ask that dressings and gravies be served on the side. Then use less of them.  Order foods that are baked, broiled, poached, steamed, stir-fried, or roasted.  Ask for margarine instead of butter, and use only a small amount.  Drink sparkling water, unsweetened tea or coffee, or diet soft drinks instead of alcohol or other sweet beverages. QUESTIONS AND ANSWERS ABOUT OTHER FATS IN THE BLOOD: SATURATED FAT, TRANS FAT, AND CHOLESTEROL What is trans fat? Trans fat is a type of fat that is formed when vegetable oil is hardened through a process called hydrogenation. This process helps makes foods more solid, gives them shape, and prolongs their shelf life. Trans fats are also called hydrogenated or partially hydrogenated oils.  What do saturated fat, trans fat, and cholesterol in foods have to do with heart disease? Saturated fat, trans fat, and cholesterol in the diet all raise the level of LDL "bad" cholesterol in the blood. The higher the LDL cholesterol, the greater the risk for coronary heart disease (CHD). Saturated fat and trans fat raise LDL similarly.  What foods contain saturated  fat, trans fat, and cholesterol? High amounts of saturated fat are found in animal products, such as fatty cuts of meat, chicken skin, and full-fat dairy products like butter, whole milk, cream, and cheese, and in tropical vegetable oils such as palm, palm kernel, and coconut oil. Trans fat is found in some of the same foods as saturated fat, such as vegetable shortening, some margarines (especially hard or stick margarine), crackers, cookies, baked goods, fried foods, salad dressings, and other processed foods made with partially hydrogenated vegetable oils. Small amounts of trans fat also occur naturally in some animal products, such as milk products, beef, and lamb. Foods high in cholesterol include liver, other organ meats, egg yolks, shrimp, and full-fat dairy products. How can I use the new food label to make heart-healthy food choices? Check the Nutrition Facts panel of the food label. Choose foods lower in saturated fat, trans fat, and cholesterol. For saturated fat and cholesterol, you can also   use the Percent Daily Value (%DV): 5% DV or less is low, and 20% DV or more is high. (There is no %DV for trans fat.) Use the Nutrition Facts panel to choose foods low in saturated fat and cholesterol, and if the trans fat is not listed, read the ingredients and limit products that list shortening or hydrogenated or partially hydrogenated vegetable oil, which tend to be high in trans fat. POINTS TO REMEMBER:   Discuss your risk for heart disease with your caregivers, and take steps to reduce risk factors.  Change your diet. Choose foods that are low in saturated fat, trans fat, and cholesterol.  Add exercise to your daily routine if it is not already being done. Participate in physical activity of moderate intensity, like brisk walking, for at least 30 minutes on most, and preferably all days of the week. No time? Break the 30 minutes into three, 10-minute segments during the day.  Stop smoking. If you do  smoke, contact your caregiver to discuss ways in which they can help you quit.  Do not use street drugs.  Maintain a normal weight.  Maintain a healthy blood pressure.  Keep up with your blood work for checking the fats in your blood as directed by your caregiver. Document Released: 01/22/2004 Document Revised: 10/05/2011 Document Reviewed: 08/19/2008 [employer] Patient Information 2013 [employer].  

## 2012-07-27 NOTE — Assessment & Plan Note (Signed)
On her medications. No problems. Will recommend nutritional changes.

## 2012-07-27 NOTE — Progress Notes (Signed)
Patient ID: Jamie [name], female   DOB: July 18, 1950, 62 y.o.   MRN: [id number] SUBJECTIVE:   HPI: Memory Short term is not too good. Long term is real good. There has been no significant change in her memory. Memory loss is related to her cancer treatments in the past, where she had the cancers in multiple sites, including her brain , ovaries, and lungs.  Patient is here for follow up of hyperlipidemia: deniesHeadache;deniesChest Pain;deniesweakness;deniesShortness of Breath and orthopnea;deniesVisual changes;deniespalpitations;deniescough;deniespedal edema;deniessymptoms of TIA or stroke;deniesClaudication symptoms. admits toCompliance with medications; deniesProblems with medications.  Overall, patient thinks she is doing pretty good.    PMH/PSH: reviewed/updated in Epic  SH/FH: reviewed/updated in Epic  Allergies: reviewed/updated in Epic  Medications: reviewed/updated in Epic  Immunizations: reviewed/updated in Epic   ROS: As above in the HPI. All other systems are stable or negative.  OBJECTIVE:     On examination she appeared in good health and spirits. Vital signs as documented. BP 112/70  Pulse 75  Temp(Src) 97 F (36.1 C) (Oral)  Ht 5\' 6"  (1.676 m)  Wt 170 lb 12.8 oz (77.474 kg)  BMI 27.58 kg/m2  Skin warm and dry and without overt rashes.  Head, Eyes, Ears, throat: normal Neck without JVD.  Lungs clear.  Heart exam notable for regular rhythm, normal sounds and absence of murmurs, rubs or gallops. Abdomen unremarkable and without evidence of organomegaly, masses, or abdominal aortic enlargement.  GYN Exam: Not applicable Extremities nonedematous. Neurologic: subtle gait disturbance.   ASSESSMENT:  Hyperlipidemia On her medications. No problems. Will recommend nutritional changes.  Memory impairment Will do a Mini-Mental Status Examination on her next visit. Patient did not have time to do it today. Will check a TSH today  CKD (chronic kidney  disease) Stable await, BMP today  History of cancer So far no signs of recurrence. Stable.     PLAN: Orders Placed This Encounter  Procedures  . BASIC METABOLIC PANEL WITH GFR  . Hepatic function panel  . NMR Lipoprofile with Lipids  . TSH   Diet and Exercise discussed with patient. For nutrition information, I recommend books: Eat to Live by Dr Monico Hoar. Prevent and Reverse Heart Disease by Dr Suzzette Righter.  Exercise recommendations are:  If unable to walk, then the patient can exercise in a chair 3 times a day. By flapping arms like a bird gently and raising legs outwards to the front.  If ambulatory, the patient can go for walks for 30 minutes 3 times a week. Then increase the intensity and duration as tolerated. Goal is to try to attain exercise frequency to 5 times a week. Best to perform resistance exercises 2 days a week and cardio type exercises 3 days per week.  Keep active, healthy diet. Return to clinic in 4 months. At which time we will perform a Mini-Mental status exam. Await today's lab. Work results. To determine if any adjustments to medications. Brooklen Runquist P. Modesto Charon, M.D.

## 2012-07-27 NOTE — Assessment & Plan Note (Signed)
Will do a Mini-Mental Status Examination on her next visit. Patient did not have time to do it today. Will check a TSH today

## 2012-07-27 NOTE — Assessment & Plan Note (Signed)
Stable await, BMP today

## 2012-07-28 ENCOUNTER — Encounter: Payer: Self-pay | Admitting: Family Medicine

## 2012-07-28 LAB — NMR LIPOPROFILE WITH LIPIDS
Cholesterol, Total: 142 mg/dL (ref <200)
HDL Particle Number: 49.4 umol/L (ref >=30.5)
HDL Size: 9.1 nm — ABNORMAL LOW (ref >=9.2)
HDL-C: 61 mg/dL (ref >=40)
LDL (calc): 59 mg/dL (ref <100)
LDL Particle Number: 798 nmol/L (ref <1000)
LDL Size: 19.8 nm — ABNORMAL LOW (ref >20.5)
LP-IR Score: 61 — ABNORMAL HIGH (ref <=45)
Large HDL-P: 4.4 umol/L — ABNORMAL LOW (ref >=4.8)
Large VLDL-P: 5.5 nmol/L — ABNORMAL HIGH (ref <=2.7)
Small LDL Particle Number: 600 nmol/L — ABNORMAL HIGH (ref <=527)
Triglycerides: 109 mg/dL (ref <150)
VLDL Size: 51.8 nm — ABNORMAL HIGH (ref <=46.6)

## 2012-07-28 NOTE — Progress Notes (Signed)
Quick Note:  Lab result at goal. No change in Medications for now. No Change in plans and follow up. Creatinine is rising. Office visit in 6 weeks to recheck the kidney function. ______

## 2012-08-02 ENCOUNTER — Telehealth: Payer: Self-pay | Admitting: Family Medicine

## 2012-08-02 NOTE — Telephone Encounter (Signed)
Copies of labs left up front for pt to pick. Pt notified

## 2012-08-08 ENCOUNTER — Other Ambulatory Visit: Payer: Self-pay

## 2012-08-08 MED ORDER — FENOFIBRATE 160 MG PO TABS: 160.0000 mg | ORAL_TABLET | Freq: Every day | ORAL | Status: DC

## 2012-08-08 MED ORDER — ROSUVASTATIN CALCIUM 10 MG PO TABS: 10.0000 mg | ORAL_TABLET | Freq: Every day | ORAL | Status: DC

## 2012-08-22 ENCOUNTER — Other Ambulatory Visit: Payer: Self-pay

## 2012-08-22 NOTE — Telephone Encounter (Signed)
Last filled 06/21/12  If approved call in and have nurse call patient to notify

## 2012-08-23 MED ORDER — ESZOPICLONE 3 MG PO TABS: 3.0000 mg | ORAL_TABLET | Freq: Every day | ORAL | Status: DC

## 2012-08-23 NOTE — Telephone Encounter (Signed)
Called into [facility].  

## 2012-08-23 NOTE — Telephone Encounter (Signed)
Will approve. Can someone call in please. Thanks.

## 2012-09-13 ENCOUNTER — Encounter (HOSPITAL_COMMUNITY): Payer: Self-pay

## 2012-09-13 ENCOUNTER — Emergency Department (HOSPITAL_COMMUNITY)
Admission: EM | Admit: 2012-09-13 | Discharge: 2012-09-13 | Disposition: A | Discharge status: Home / Self Care | Payer: No Typology Code available for payment source | Attending: Emergency Medicine | Admitting: Emergency Medicine

## 2012-09-13 ENCOUNTER — Emergency Department (HOSPITAL_COMMUNITY): Payer: No Typology Code available for payment source

## 2012-09-13 DIAGNOSIS — Z87891 Personal history of nicotine dependence: Secondary | ICD-10-CM | POA: Insufficient documentation

## 2012-09-13 DIAGNOSIS — Z23 Encounter for immunization: Secondary | ICD-10-CM | POA: Insufficient documentation

## 2012-09-13 DIAGNOSIS — Z79899 Other long term (current) drug therapy: Secondary | ICD-10-CM | POA: Insufficient documentation

## 2012-09-13 DIAGNOSIS — IMO0002 Reserved for concepts with insufficient information to code with codable children: Secondary | ICD-10-CM | POA: Insufficient documentation

## 2012-09-13 DIAGNOSIS — Z9889 Other specified postprocedural states: Secondary | ICD-10-CM | POA: Insufficient documentation

## 2012-09-13 DIAGNOSIS — Z85841 Personal history of malignant neoplasm of brain: Secondary | ICD-10-CM | POA: Insufficient documentation

## 2012-09-13 DIAGNOSIS — S20219A Contusion of unspecified front wall of thorax, initial encounter: Secondary | ICD-10-CM

## 2012-09-13 DIAGNOSIS — E7800 Pure hypercholesterolemia, unspecified: Secondary | ICD-10-CM | POA: Insufficient documentation

## 2012-09-13 DIAGNOSIS — Y92410 Unspecified street and highway as the place of occurrence of the external cause: Secondary | ICD-10-CM | POA: Insufficient documentation

## 2012-09-13 DIAGNOSIS — Y9389 Activity, other specified: Secondary | ICD-10-CM | POA: Insufficient documentation

## 2012-09-13 DIAGNOSIS — S6990XA Unspecified injury of unspecified wrist, hand and finger(s), initial encounter: Secondary | ICD-10-CM | POA: Insufficient documentation

## 2012-09-13 DIAGNOSIS — Z7982 Long term (current) use of aspirin: Secondary | ICD-10-CM | POA: Insufficient documentation

## 2012-09-13 DIAGNOSIS — Z85118 Personal history of other malignant neoplasm of bronchus and lung: Secondary | ICD-10-CM | POA: Insufficient documentation

## 2012-09-13 DIAGNOSIS — S59919A Unspecified injury of unspecified forearm, initial encounter: Secondary | ICD-10-CM | POA: Insufficient documentation

## 2012-09-13 DIAGNOSIS — T07XXXA Unspecified multiple injuries, initial encounter: Secondary | ICD-10-CM

## 2012-09-13 DIAGNOSIS — S59909A Unspecified injury of unspecified elbow, initial encounter: Secondary | ICD-10-CM | POA: Insufficient documentation

## 2012-09-13 MED ORDER — IBUPROFEN 400 MG PO TABS
600.0000 mg | ORAL_TABLET | Freq: Once | ORAL | Status: AC
  Administered 2012-09-13: 600 mg via ORAL
  Filled 2012-09-13: qty 2

## 2012-09-13 MED ORDER — TETANUS-DIPHTH-ACELL PERTUSSIS 5-2.5-18.5 LF-MCG/0.5 IM SUSP
0.5000 mL | Freq: Once | INTRAMUSCULAR | Status: AC
  Administered 2012-09-13: 0.5 mL via INTRAMUSCULAR
  Filled 2012-09-13: qty 0.5

## 2012-09-13 MED ORDER — OXYCODONE-ACETAMINOPHEN 5-325 MG PO TABS
2.0000 | ORAL_TABLET | Freq: Once | ORAL | Status: AC
  Administered 2012-09-13: 2 via ORAL
  Filled 2012-09-13: qty 2

## 2012-09-13 NOTE — ED Notes (Signed)
Patient removed from LSB by Dr Juleen China.
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
## 2012-11-28 ENCOUNTER — Ambulatory Visit (INDEPENDENT_AMBULATORY_CARE_PROVIDER_SITE_OTHER): Payer: Medicare Other | Admitting: Family Medicine

## 2012-11-28 ENCOUNTER — Encounter: Payer: Self-pay | Admitting: Family Medicine

## 2012-11-28 VITALS — BP 92/52 | HR 68 | Temp 98.3°F | Ht 66.0 in | Wt 166.6 lb

## 2012-11-28 DIAGNOSIS — N183 Chronic kidney disease, stage 3 unspecified: Secondary | ICD-10-CM

## 2012-11-28 DIAGNOSIS — I959 Hypotension, unspecified: Secondary | ICD-10-CM

## 2012-11-28 DIAGNOSIS — Z859 Personal history of malignant neoplasm, unspecified: Secondary | ICD-10-CM

## 2012-11-28 DIAGNOSIS — E785 Hyperlipidemia, unspecified: Secondary | ICD-10-CM

## 2012-11-28 DIAGNOSIS — G4700 Insomnia, unspecified: Secondary | ICD-10-CM

## 2012-11-28 DIAGNOSIS — R413 Other amnesia: Secondary | ICD-10-CM

## 2012-11-28 MED ORDER — ESZOPICLONE 3 MG PO TABS: 3.0000 mg | ORAL_TABLET | Freq: Every day | ORAL | Status: DC

## 2012-11-28 MED ORDER — ROSUVASTATIN CALCIUM 10 MG PO TABS: 10.0000 mg | ORAL_TABLET | Freq: Every day | ORAL | Status: DC

## 2012-11-28 MED ORDER — FENOFIBRATE 160 MG PO TABS: 160.0000 mg | ORAL_TABLET | Freq: Every day | ORAL | Status: DC

## 2012-11-28 NOTE — Progress Notes (Signed)
Patient ID: Jamie [name], female   DOB: 24-Mar-1951, 62 y.o.   MRN: [id number] SUBJECTIVE: CC: Chief Complaint  Patient presents with  . Follow-up    4 month follow up  . Medication Refill    needs refills all meds     HPI: Patient is here for follow up of hyperlipidemia/CKD/syncope/memory impairment. denies Headache;denies Chest Pain;denies weakness;denies Shortness of Breath and orthopnea;denies Visual changes;denies palpitations;denies cough;denies pedal edema;denies symptoms of TIA or stroke;deniesClaudication symptoms. admits to Compliance with medications; denies Problems with medications.  Breakfast: raisin bran,milk 1% Lunch: sandwich [place], Supper; salad (Potato salad)   Past Medical History  Diagnosis Date  . Cancer   . Brain cancer   . Lung cancer   . High cholesterol    Past Surgical History  Procedure Laterality Date  . Catarac Bilateral   . Brain surgery  2001  . Abdominal hysterectomy  1986  . Lobectomy     History   Social History  . Marital Status: Divorced    Spouse Name: N/A    Number of Children: N/A  . Years of Education: N/A   Occupational History  . Not on file.   Social History Main Topics  . Smoking status: Former Smoker    Quit date: 05/08/2012  . Smokeless tobacco: Not on file  . Alcohol Use: Yes     Comment: occ  . Drug Use: No  . Sexually Active: No   Other Topics Concern  . Not on file   Social History Narrative  . No narrative on file   Family History  Problem Relation Age of Onset  . Cancer Mother     cirrhois of liver  . Heart attack Father    Current Outpatient Prescriptions on File Prior to Visit  Medication Sig Dispense Refill  . aspirin EC 81 MG tablet Take 81 mg by mouth daily.        . Calcium-Magnesium-Vitamin D (CALCIUM MAGNESIUM PO) Take 1 tablet by mouth daily.        . Eszopiclone (ESZOPICLONE) 3 MG TABS Take 1 tablet (3 mg total) by mouth at bedtime. Take immediately before bedtime  30 tablet  0  .  fenofibrate 160 MG tablet Take 1 tablet (160 mg total) by mouth daily.  90 tablet  0  . rosuvastatin (CRESTOR) 10 MG tablet Take 1 tablet (10 mg total) by mouth at bedtime.  90 tablet  0  . valACYclovir (VALTREX) 1000 MG tablet Take 1,000 mg by mouth daily as needed. outbreaks       No current facility-administered medications on file prior to visit.   Allergies  Allergen Reactions  . Fosamax (Alendronate Sodium)   . Sulfonamide Derivatives     REACTION: Hives, tongue swells   Immunization History  Administered Date(s) Administered  . Tdap 09/13/2012   Prior to Admission medications   Medication Sig Start Date End Date Taking? Authorizing Provider  aspirin EC 81 MG tablet Take 81 mg by mouth daily.     Yes Historical Provider, MD  Calcium-Magnesium-Vitamin D (CALCIUM MAGNESIUM PO) Take 1 tablet by mouth daily.     Yes Historical Provider, MD  Eszopiclone (ESZOPICLONE) 3 MG TABS Take 1 tablet (3 mg total) by mouth at bedtime. Take immediately before bedtime 08/22/12  Yes Ileana Ladd, MD  fenofibrate 160 MG tablet Take 1 tablet (160 mg total) by mouth daily. 08/08/12  Yes Ileana Ladd, MD  rosuvastatin (CRESTOR) 10 MG tablet Take 1 tablet (10 mg total)  by mouth at bedtime. 08/08/12  Yes Ileana Ladd, MD  valACYclovir (VALTREX) 1000 MG tablet Take 1,000 mg by mouth daily as needed. outbreaks 05/20/12  Yes Historical Provider, MD     ROS: As above in the HPI. All other systems are stable or negative.  OBJECTIVE: APPEARANCE:  Patient in no acute distress.The patient appeared well nourished and normally developed. Acyanotic. Waist: VITAL SIGNS:BP 92/52  Pulse 68  Temp(Src) 98.3 F (36.8 C) (Oral)  Ht 5\' 6"  (1.676 m)  Wt 166 lb 9.6 oz (75.569 kg)  BMI 26.9 kg/m2  WF pleasant  SKIN: warm and  Dry without overt rashes, tattoos and scars  HEAD and Neck: without JVD, Head and scalp: normal Eyes:No scleral icterus. Fundi normal, eye movements normal. Ears: Auricle normal, canal  normal, Tympanic membranes normal, insufflation normal. Nose: normal Throat: normal Neck & thyroid: normal  CHEST & LUNGS: Chest wall: normal Lungs: Clear  CVS: Reveals the PMI to be normally located. Regular rhythm, First and Second Heart sounds are normal,  absence of murmurs, rubs or gallops. Peripheral vasculature: Radial pulses: normal Dorsal pedis pulses: normal Posterior pulses: normal  ABDOMEN:  Appearance: normal Benign, no organomegaly, no masses, no Abdominal Aortic enlargement. No Guarding , no rebound. No Bruits. Bowel sounds: normal  RECTAL: N/A GU: N/A  EXTREMETIES: nonedematous. Both Femoral and Pedal pulses are normal.  MUSCULOSKELETAL:  Spine: normal Joints: intact  NEUROLOGIC: oriented to time,place and person; nonfocal. Slow mentation, not new Strength is normal Sensory is normal Cranial Nerves are normal. Gait a little wide and cautious.  Results for orders placed in visit on 07/27/12  BASIC METABOLIC PANEL WITH GFR      Result Value Range   Sodium 143  135 - 145 mEq/L   Potassium 4.2  3.5 - 5.3 mEq/L   Chloride 106  96 - 112 mEq/L   CO2 27  19 - 32 mEq/L   Glucose, Bld 81  70 - 99 mg/dL   BUN 16  6 - 23 mg/dL   Creat 1.61 (*) 0.96 - 1.10 mg/dL   Calcium 9.8  8.4 - 04.5 mg/dL   GFR, Est African American 57 (*)    GFR, Est Non African American 50 (*)   HEPATIC FUNCTION PANEL      Result Value Range   Total Bilirubin 0.4  0.3 - 1.2 mg/dL   Bilirubin, Direct 0.1  0.0 - 0.3 mg/dL   Indirect Bilirubin 0.3  0.0 - 0.9 mg/dL   Alkaline Phosphatase 36 (*) 39 - 117 U/L   AST 22  0 - 37 U/L   ALT 21  0 - 35 U/L   Total Protein 6.8  6.0 - 8.3 g/dL   Albumin 4.9  3.5 - 5.2 g/dL  NMR LIPOPROFILE WITH LIPIDS      Result Value Range   LDL Particle Number 798  <1000 nmol/L   LDL (calc) 59  <100 mg/dL   HDL-C 61  >=40 mg/dL   Triglycerides 981  <191 mg/dL   Cholesterol, Total 478  <200 mg/dL   HDL Particle Number 29.5  >=62.1 umol/L   Large  HDL-P 4.4 (*) >=4.8 umol/L   Large VLDL-P 5.5 (*) <=2.7 nmol/L   Small LDL Particle Number 600 (*) <=527 nmol/L   LDL Size 19.8 (*) >20.5 nm   HDL Size 9.1 (*) >=9.2 nm   VLDL Size 51.8 (*) <=46.6 nm   LP-IR Score 61 (*) <=45  TSH  Result Value Range   TSH 1.458  0.350 - 4.500 uIU/mL    ASSESSMENT: Hyperlipidemia - Plan: CMP14+EGFR, NMR, lipoprofile, fenofibrate 160 MG tablet, rosuvastatin (CRESTOR) 10 MG tablet  Memory impairment - Plan: Vitamin B12, Folate, CBC With differential/Platelet  CKD (chronic kidney disease), stage 3 (moderate) - Plan: CMP14+EGFR  History of cancer - Plan: CMP14+EGFR, CBC With differential/Platelet  Hypotension - Plan: CMP14+EGFR  Insomnia - Plan: Eszopiclone (ESZOPICLONE) 3 MG TABS  PLAN:  Orders Placed This Encounter  Procedures  . CMP14+EGFR  . NMR, lipoprofile  . Vitamin B12  . Folate  . CBC With differential/Platelet    Meds ordered this encounter  Medications  . Eszopiclone (ESZOPICLONE) 3 MG TABS    Sig: Take 1 tablet (3 mg total) by mouth at bedtime. Take immediately before bedtime    Dispense:  30 tablet    Refill:  2  . fenofibrate 160 MG tablet    Sig: Take 1 tablet (160 mg total) by mouth daily.    Dispense:  90 tablet    Refill:  3  . rosuvastatin (CRESTOR) 10 MG tablet    Sig: Take 1 tablet (10 mg total) by mouth at bedtime.    Dispense:  90 tablet    Refill:  3    Reviewed her previous labs.  Diet reviewed and ways to improve.  Keep active   Return in about 3 months (around 02/28/2013) for Recheck medical problems.  Dietrich Samuelson P. [place] Charon, M.D.

## 2012-11-29 LAB — CMP14+EGFR
ALT: 15 IU/L (ref 0–32)
AST: 19 IU/L (ref 0–40)
Albumin/Globulin Ratio: 2.8 — ABNORMAL HIGH (ref 1.1–2.5)
Albumin: 4.7 g/dL (ref 3.6–4.8)
Alkaline Phosphatase: 38 IU/L — ABNORMAL LOW (ref 39–117)
BUN/Creatinine Ratio: 13 (ref 11–26)
BUN: 16 mg/dL (ref 8–27)
CO2: 24 mmol/L (ref 18–29)
Calcium: 9.4 mg/dL (ref 8.6–10.2)
Chloride: 106 mmol/L (ref 97–108)
Creatinine, Ser: 1.26 mg/dL — ABNORMAL HIGH (ref 0.57–1.00)
GFR calc Af Amer: 54 mL/min/{1.73_m2} — ABNORMAL LOW (ref >59)
GFR calc non Af Amer: 46 mL/min/{1.73_m2} — ABNORMAL LOW (ref >59)
Globulin, Total: 1.7 g/dL (ref 1.5–4.5)
Glucose: 87 mg/dL (ref 65–99)
Potassium: 4.4 mmol/L (ref 3.5–5.2)
Sodium: 144 mmol/L (ref 134–144)
Total Bilirubin: 0.3 mg/dL (ref 0.0–1.2)
Total Protein: 6.4 g/dL (ref 6.0–8.5)

## 2012-11-29 LAB — FOLATE: Folate: >19.9 ng/mL (ref >3.0)

## 2012-11-29 LAB — CBC WITH DIFFERENTIAL
Basophils Absolute: 0 10*3/uL (ref 0.0–0.2)
Basos: 1 % (ref 0–3)
Eos: 2 % (ref 0–5)
Eosinophils Absolute: 0.1 10*3/uL (ref 0.0–0.4)
HCT: 40.5 % (ref 34.0–46.6)
Hemoglobin: 13.5 g/dL (ref 11.1–15.9)
Immature Grans (Abs): 0 10*3/uL (ref 0.0–0.1)
Immature Granulocytes: 0 % (ref 0–2)
Lymphocytes Absolute: 1.4 10*3/uL (ref 0.7–3.1)
Lymphs: 23 % (ref 14–46)
MCH: 30.5 pg (ref 26.6–33.0)
MCHC: 33.3 g/dL (ref 31.5–35.7)
MCV: 91 fL (ref 79–97)
Monocytes Absolute: 0.5 10*3/uL (ref 0.1–0.9)
Monocytes: 8 % (ref 4–12)
Neutrophils Absolute: 4 10*3/uL (ref 1.4–7.0)
Neutrophils Relative %: 66 % (ref 40–74)
Platelets: 262 10*3/uL (ref 150–379)
RBC: 4.43 x10E6/uL (ref 3.77–5.28)
RDW: 13.3 % (ref 12.3–15.4)
WBC: 6 10*3/uL (ref 3.4–10.8)

## 2012-11-29 LAB — VITAMIN B12: Vitamin B-12: 254 pg/mL (ref 211–946)

## 2012-11-30 LAB — NMR, LIPOPROFILE
Cholesterol: 150 mg/dL (ref <200)
HDL Cholesterol by NMR: 60 mg/dL (ref >=40)
HDL Particle Number: 50 umol/L (ref >=30.5)
LDL Particle Number: 1276 nmol/L — ABNORMAL HIGH (ref <1000)
LDL Size: 20.5 nm — ABNORMAL LOW (ref >20.5)
LDLC SERPL CALC-MCNC: 61 mg/dL (ref <100)
LP-IR Score: 78 — ABNORMAL HIGH (ref <=45)
Small LDL Particle Number: 824 nmol/L — ABNORMAL HIGH (ref <=527)
Triglycerides by NMR: 146 mg/dL (ref <150)

## 2012-11-30 NOTE — Progress Notes (Signed)
Quick Note:  Lab result at goal. No change in Medications for now. No Change in plans and follow up. ______ 

## 2013-02-22 ENCOUNTER — Other Ambulatory Visit: Payer: Self-pay

## 2013-02-27 ENCOUNTER — Other Ambulatory Visit: Payer: Self-pay | Admitting: Family Medicine

## 2013-02-28 NOTE — Telephone Encounter (Signed)
Last filled and seen 11/28/12 by [place] Charon. Uses [facility]

## 2013-03-02 NOTE — Telephone Encounter (Signed)
Called in.

## 2013-03-30 ENCOUNTER — Ambulatory Visit: Payer: Medicare Other | Admitting: Family Medicine

## 2013-04-06 ENCOUNTER — Ambulatory Visit: Payer: Medicare Other | Admitting: Family Medicine

## 2013-04-09 ENCOUNTER — Other Ambulatory Visit: Payer: Self-pay | Admitting: Family Medicine

## 2013-04-10 NOTE — Telephone Encounter (Signed)
Rx ready for nurse to Phone in. 

## 2013-04-10 NOTE — Telephone Encounter (Signed)
Last seen 11/28/12  [facility] 

## 2013-04-11 ENCOUNTER — Other Ambulatory Visit: Payer: Self-pay | Admitting: Family Medicine

## 2013-04-11 NOTE — Telephone Encounter (Signed)
Spoke with Jamie [name] at [facility] rx given .

## 2013-04-30 ENCOUNTER — Encounter: Payer: Self-pay | Admitting: Internal Medicine

## 2013-05-07 ENCOUNTER — Encounter: Payer: Self-pay | Admitting: Internal Medicine

## 2013-05-17 ENCOUNTER — Other Ambulatory Visit: Payer: Self-pay | Admitting: Family Medicine

## 2013-05-18 NOTE — Telephone Encounter (Signed)
Call patient : Prescription refilled & sent to pharmacy in [facility]. 

## 2013-05-30 ENCOUNTER — Ambulatory Visit (AMBULATORY_SURGERY_CENTER): Payer: Self-pay | Admitting: *Deleted

## 2013-05-30 ENCOUNTER — Telehealth: Payer: Self-pay

## 2013-05-30 VITALS — Ht 66.0 in | Wt 165.0 lb

## 2013-05-30 DIAGNOSIS — Z86010 Personal history of colonic polyps: Secondary | ICD-10-CM

## 2013-05-30 MED ORDER — MOVIPREP 100 G PO SOLR: ORAL | Status: DC

## 2013-05-30 NOTE — Progress Notes (Signed)
Patient denies any allergies to eggs or soy. Patient denies any problems with anesthesia.  

## 2013-06-01 ENCOUNTER — Encounter: Payer: Self-pay | Admitting: Internal Medicine

## 2013-06-08 ENCOUNTER — Ambulatory Visit: Payer: Medicare Other | Admitting: Family Medicine

## 2013-06-08 NOTE — Telephone Encounter (Signed)
Error

## 2013-06-11 ENCOUNTER — Encounter: Payer: Self-pay | Admitting: *Deleted

## 2013-06-12 ENCOUNTER — Encounter: Payer: Medicare Other | Admitting: Internal Medicine

## 2013-06-21 ENCOUNTER — Telehealth: Payer: Self-pay | Admitting: Internal Medicine

## 2013-06-21 DIAGNOSIS — Z86010 Personal history of colonic polyps: Secondary | ICD-10-CM

## 2013-06-21 MED ORDER — MOVIPREP 100 G PO SOLR: ORAL | Status: DC

## 2013-06-21 NOTE — Telephone Encounter (Signed)
Rx for MoviPrep resent to pharmacy.  Updated prep instructions reviewed with pt.  Pt said prep cost her $6.00.  She will call if cost is more than that so that we can give her a voucher.

## 2013-06-29 ENCOUNTER — Encounter: Payer: Self-pay | Admitting: Family Medicine

## 2013-06-29 ENCOUNTER — Ambulatory Visit (INDEPENDENT_AMBULATORY_CARE_PROVIDER_SITE_OTHER): Payer: Medicare Other | Admitting: Family Medicine

## 2013-06-29 VITALS — BP 118/79 | HR 77 | Temp 97.4°F | Ht 66.0 in | Wt 160.2 lb

## 2013-06-29 DIAGNOSIS — Z859 Personal history of malignant neoplasm, unspecified: Secondary | ICD-10-CM

## 2013-06-29 DIAGNOSIS — E785 Hyperlipidemia, unspecified: Secondary | ICD-10-CM

## 2013-06-29 DIAGNOSIS — C3490 Malignant neoplasm of unspecified part of unspecified bronchus or lung: Secondary | ICD-10-CM | POA: Insufficient documentation

## 2013-06-29 DIAGNOSIS — R413 Other amnesia: Secondary | ICD-10-CM

## 2013-06-29 DIAGNOSIS — G4700 Insomnia, unspecified: Secondary | ICD-10-CM

## 2013-06-29 DIAGNOSIS — C719 Malignant neoplasm of brain, unspecified: Secondary | ICD-10-CM | POA: Insufficient documentation

## 2013-06-29 DIAGNOSIS — N189 Chronic kidney disease, unspecified: Secondary | ICD-10-CM

## 2013-06-29 MED ORDER — DOXEPIN HCL 10 MG PO CAPS: 10.0000 mg | ORAL_CAPSULE | Freq: Every day | ORAL | Status: DC

## 2013-06-29 NOTE — Progress Notes (Signed)
Patient ID: Jamie [name], female   DOB: [birth year], 63 y.o.   MRN: [id number] SUBJECTIVE: CC: Chief Complaint  Patient presents with  . Follow-up    4 month followo up chronic problems  insurance will not cover lunesta.    HPI: Patient is here for follow up of hyperlipidemia/CKD/h/o cancer/memory impairment: denies Headache;denies Chest Pain;denies weakness;denies Shortness of Breath and orthopnea;denies Visual changes;denies palpitations;denies cough;denies pedal edema;denies symptoms of TIA or stroke;deniesClaudication symptoms. admits to Compliance with medications; denies Problems with medications.  Insomnia: not sleeping, not on Lunesta, insurance does not cover.  No change in the memory impairment. Slow subtle decline , per family member who accompany patient. 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  Past Medical History  Diagnosis Date  . High cholesterol   . CKD (chronic kidney disease), stage III   . Cancer   . Brain cancer   . Lung cancer    Past Surgical History  Procedure Laterality Date  . Catarac Bilateral   . Brain surgery  2001  . Abdominal hysterectomy  1986  . Lobectomy     History   Social History  . Marital Status: Divorced    Spouse Name: N/A    Number of Children: N/A  . Years of Education: N/A   Occupational History  . Not on file.   Social History Main Topics  . Smoking status: Former Smoker    Quit date: 05/08/2012  . Smokeless tobacco: Never Used  . Alcohol Use: 1.8 oz/week    3 Cans of beer per week     Comment: occassional Saturday night 3 dark beer.  . Drug Use: No  . Sexual Activity: No   Other Topics Concern  . Not on file   Social History Narrative  . No narrative on file   Family History  Problem Relation Age of Onset  . Cancer Mother     cirrhois of liver  . Heart attack Father   . Colon cancer Father   . Asthma Brother    Current Outpatient Prescriptions on File Prior to Visit   Medication Sig Dispense Refill  . aspirin EC 81 MG tablet Take 81 mg by mouth daily.        . Calcium-Magnesium-Vitamin D (CALCIUM MAGNESIUM PO) Take 1 tablet by mouth daily.        . fenofibrate 160 MG tablet Take 1 tablet (160 mg total) by mouth daily.  90 tablet  3  . magnesium oxide (MAG-OX) 400 MG tablet Take 400 mg by mouth daily.      . rosuvastatin (CRESTOR) 10 MG tablet Take 1 tablet (10 mg total) by mouth at bedtime.  90 tablet  3  . folic acid (FOLVITE) 696 MCG tablet Take 400 mcg by mouth daily.      . Probiotic Product (PROBIOTIC DAILY PO) Take 1 capsule by mouth daily.      . valACYclovir (VALTREX) 1000 MG tablet Take 1,000 mg by mouth daily as needed. outbreaks       No current facility-administered medications on file prior to visit.   Allergies  Allergen Reactions  . Fosamax [Alendronate Sodium]   . Sulfonamide Derivatives     REACTION: Hives, tongue swells   Immunization History  Administered Date(s) Administered  . Tdap 09/13/2012   Prior to Admission medications   Medication Sig Start Date End Date Taking? Authorizing Provider  aspirin EC 81 MG tablet Take 81 mg by mouth daily.  Historical Provider, MD  Calcium-Magnesium-Vitamin D (CALCIUM MAGNESIUM PO) Take 1 tablet by mouth daily.      Historical Provider, MD  Eszopiclone 3 MG TABS TAKE 1 TABLET IMMEDIATELY BEFORE BEDTIME 05/17/13   Vernie Shanks, MD  fenofibrate 160 MG tablet Take 1 tablet (160 mg total) by mouth daily. 11/28/12   Vernie Shanks, MD  folic acid (FOLVITE) 165 MCG tablet Take 400 mcg by mouth daily.    Historical Provider, MD  magnesium oxide (MAG-OX) 400 MG tablet Take 400 mg by mouth daily.    Historical Provider, MD  MOVIPREP 100 G SOLR Take as directed 06/21/13   Gatha Mayer, MD  Probiotic Product (PROBIOTIC DAILY PO) Take 1 capsule by mouth daily.    Historical Provider, MD  rosuvastatin (CRESTOR) 10 MG tablet Take 1 tablet (10 mg total) by mouth at bedtime. 11/28/12   Vernie Shanks, MD   valACYclovir (VALTREX) 1000 MG tablet Take 1,000 mg by mouth daily as needed. outbreaks 05/20/12   Historical Provider, MD     ROS: As above in the HPI. All other systems are stable or negative.  OBJECTIVE: APPEARANCE:  Patient in no acute distress.The patient appeared well nourished and normally developed. Acyanotic. Waist: VITAL SIGNS:BP 118/79  Pulse 77  Temp(Src) 97.4 F (36.3 C) (Oral)  Ht '5\' 6"'  (1.676 m)  Wt 160 lb 3.2 oz (72.666 kg)  BMI 25.87 kg/m2  WF  SKIN: warm and  Dry without overt rashes, tattoos and scars  HEAD and Neck: without JVD, Head and scalp: normal Eyes:No scleral icterus. Fundi normal, eye movements normal. Ears: Auricle normal, canal normal, Tympanic membranes normal, insufflation normal. Nose: normal Throat: normal Neck & thyroid: normal  CHEST & LUNGS: Chest wall: normal Lungs: Clear  CVS: Reveals the PMI to be normally located. Regular rhythm, First and Second Heart sounds are normal,  absence of murmurs, rubs or gallops. Peripheral vasculature: Radial pulses: normal Dorsal pedis pulses: normal Posterior pulses: normal  ABDOMEN:  Appearance: normal Benign, no organomegaly, no masses, no Abdominal Aortic enlargement. No Guarding , no rebound. No Bruits. Bowel sounds: normal  RECTAL: N/A GU: N/A  EXTREMETIES: nonedematous.  MUSCULOSKELETAL:  Spine: normal Joints: intact Gait is careful and slow   NEUROLOGIC: oriented to place and person; nonfocal.  Results for orders placed in visit on 11/28/12  CMP14+EGFR      Result Value Ref Range   Glucose 87  65 - 99 mg/dL   BUN 16  8 - 27 mg/dL   Creatinine, Ser 1.26 (*) 0.57 - 1.00 mg/dL   GFR calc non Af Amer 46 (*) >59 mL/min/1.73   GFR calc Af Amer 54 (*) >59 mL/min/1.73   BUN/Creatinine Ratio 13  11 - 26   Sodium 144  134 - 144 mmol/L   Potassium 4.4  3.5 - 5.2 mmol/L   Chloride 106  97 - 108 mmol/L   CO2 24  18 - 29 mmol/L   Calcium 9.4  8.6 - 10.2 mg/dL   Total  Protein 6.4  6.0 - 8.5 g/dL   Albumin 4.7  3.6 - 4.8 g/dL   Globulin, Total 1.7  1.5 - 4.5 g/dL   Albumin/Globulin Ratio 2.8 (*) 1.1 - 2.5   Total Bilirubin 0.3  0.0 - 1.2 mg/dL   Alkaline Phosphatase 38 (*) 39 - 117 IU/L   AST 19  0 - 40 IU/L   ALT 15  0 - 32 IU/L  NMR, LIPOPROFILE  Result Value Ref Range   LDL Particle Number 1276 (*) <1000 nmol/L   LDLC SERPL CALC-MCNC 61  <100 mg/dL   HDL Cholesterol by NMR 60  >=40 mg/dL   Triglycerides by NMR 146  <150 mg/dL   Cholesterol 150  <200 mg/dL   HDL Particle Number 50.0  >=30.5 umol/L   Small LDL Particle Number 824 (*) <=527 nmol/L   LDL Size 20.5 (*) >20.5 nm   LP-IR Score 78 (*) <=45  VITAMIN B12      Result Value Ref Range   Vitamin B-12 254  211 - 946 pg/mL  FOLATE      Result Value Ref Range   Folate >19.9  >3.0 ng/mL  CBC WITH DIFFERENTIAL/PLATELET      Result Value Ref Range   WBC 6.0  3.4 - 10.8 x10E3/uL   RBC 4.43  3.77 - 5.28 x10E6/uL   Hemoglobin 13.5  11.1 - 15.9 g/dL   HCT 40.5  34.0 - 46.6 %   MCV 91  79 - 97 fL   MCH 30.5  26.6 - 33.0 pg   MCHC 33.3  31.5 - 35.7 g/dL   RDW 13.3  12.3 - 15.4 %   Platelets 262  150 - 379 x10E3/uL   Neutrophils Relative % 66  40 - 74 %   Lymphs 23  14 - 46 %   Monocytes 8  4 - 12 %   Eos 2  0 - 5 %   Basos 1  0 - 3 %   Neutrophils Absolute 4.0  1.4 - 7.0 x10E3/uL   Lymphocytes Absolute 1.4  0.7 - 3.1 x10E3/uL   Monocytes Absolute 0.5  0.1 - 0.9 x10E3/uL   Eosinophils Absolute 0.1  0.0 - 0.4 x10E3/uL   Basophils Absolute 0.0  0.0 - 0.2 x10E3/uL   Immature Granulocytes 0  0 - 2 %   Immature Grans (Abs) 0.0  0.0 - 0.1 x10E3/uL    ASSESSMENT:  Memory impairment  Hyperlipidemia - Plan: CMP14+EGFR, Lipid panel  History of cancer  CKD (chronic kidney disease)  Brain cancer  Lung cancer  Insomnia - Plan: doxepin (SINEQUAN) 10 MG capsule  PLAN: At this point, not much to offer. Await labs. Continue same  Regimen. Recommend regular exercise to maintain  physical conditioning and gait and reduce her risk for falls and possibly to maintain memory.  Orders Placed This Encounter  Procedures  . CMP14+EGFR  . Lipid panel   Meds ordered this encounter  Medications  . doxepin (SINEQUAN) 10 MG capsule    Sig: Take 1 capsule (10 mg total) by mouth at bedtime.    Dispense:  30 capsule    Refill:  3   Medications Discontinued During This Encounter  Medication Reason  . MOVIPREP 100 G SOLR Completed Course  . Eszopiclone 3 MG TABS Formulary change   Return in about 4 months (around 10/29/2013) for Recheck medical problems.  Brynlie Daza P. Jacelyn Grip, M.D.

## 2013-06-30 LAB — CMP14+EGFR
ALT: 13 IU/L (ref 0–32)
AST: 20 IU/L (ref 0–40)
Albumin/Globulin Ratio: 2.8 — ABNORMAL HIGH (ref 1.1–2.5)
Albumin: 5 g/dL — ABNORMAL HIGH (ref 3.6–4.8)
Alkaline Phosphatase: 41 IU/L (ref 39–117)
BUN/Creatinine Ratio: 13 (ref 11–26)
BUN: 15 mg/dL (ref 8–27)
CO2: 25 mmol/L (ref 18–29)
Calcium: 10 mg/dL (ref 8.7–10.3)
Chloride: 105 mmol/L (ref 97–108)
Creatinine, Ser: 1.17 mg/dL — ABNORMAL HIGH (ref 0.57–1.00)
GFR calc Af Amer: 59 mL/min/{1.73_m2} — ABNORMAL LOW (ref >59)
GFR calc non Af Amer: 51 mL/min/{1.73_m2} — ABNORMAL LOW (ref >59)
Globulin, Total: 1.8 g/dL (ref 1.5–4.5)
Glucose: 84 mg/dL (ref 65–99)
Potassium: 4.7 mmol/L (ref 3.5–5.2)
Sodium: 147 mmol/L — ABNORMAL HIGH (ref 134–144)
Total Bilirubin: 0.4 mg/dL (ref 0.0–1.2)
Total Protein: 6.8 g/dL (ref 6.0–8.5)

## 2013-06-30 LAB — LIPID PANEL
Chol/HDL Ratio: 2.2 ratio units (ref 0.0–4.4)
Cholesterol, Total: 146 mg/dL (ref 100–199)
HDL: 67 mg/dL (ref >39)
LDL Calculated: 62 mg/dL (ref 0–99)
Triglycerides: 86 mg/dL (ref 0–149)
VLDL Cholesterol Cal: 17 mg/dL (ref 5–40)

## 2013-07-18 ENCOUNTER — Other Ambulatory Visit: Payer: Self-pay | Admitting: Internal Medicine

## 2013-07-18 ENCOUNTER — Encounter: Payer: Self-pay | Admitting: Internal Medicine

## 2013-07-18 ENCOUNTER — Ambulatory Visit (AMBULATORY_SURGERY_CENTER): Payer: Medicare Other | Admitting: Internal Medicine

## 2013-07-18 VITALS — BP 137/74 | HR 70 | Temp 98.3°F | Resp 15 | Ht 66.0 in | Wt 165.0 lb

## 2013-07-18 DIAGNOSIS — Z860100 Personal history of colon polyps, unspecified: Secondary | ICD-10-CM | POA: Insufficient documentation

## 2013-07-18 DIAGNOSIS — D126 Benign neoplasm of colon, unspecified: Secondary | ICD-10-CM

## 2013-07-18 DIAGNOSIS — K5730 Diverticulosis of large intestine without perforation or abscess without bleeding: Secondary | ICD-10-CM

## 2013-07-18 HISTORY — PX: COLONOSCOPY: SHX174

## 2013-07-18 MED ORDER — SODIUM CHLORIDE 0.9 % IV SOLN: 500.0000 mL | INTRAVENOUS | Status: DC

## 2013-07-18 NOTE — Op Note (Signed)
[place]  [address] [employer] [address]. [place], [address]   COLONOSCOPY PROCEDURE REPORT  PATIENT: Jamie [name], Jamie [name]  MR#: [id number] BIRTHDATE: 1951-03-19 , 62  yrs. old GENDER: Female ENDOSCOPIST: Gatha Mayer, MD, [facility] PROCEDURE DATE:  07/18/2013 PROCEDURE:   Colonoscopy with snare polypectomy First Screening Colonoscopy - Avg.  risk and is 50 yrs.  old or older - No.  Prior Negative Screening - Now for repeat screening. N/A  History of Adenoma - Now for follow-up colonoscopy & has been > or = to 3 yrs.  Yes hx of adenoma.  Has been 3 or more years since last colonoscopy.  Polyps Removed Today? Yes. ASA CLASS:   Class III INDICATIONS:Patient's personal history of adenomatous colon polyps.  MEDICATIONS: propofol (Diprivan) 100mg  IV, MAC sedation, administered by CRNA, and These medications were titrated to patient response per physician's verbal order  DESCRIPTION OF PROCEDURE:   After the risks benefits and alternatives of the procedure were thoroughly explained, informed consent was obtained.  A digital rectal exam revealed no abnormalities of the rectum.   The LB [id number]  endoscope was introduced through the anus and advanced to the cecum, which was identified by both the appendix and ileocecal valve. No adverse events experienced.   The quality of the prep was excellent using Suprep  The instrument was then slowly withdrawn as the colon was fully examined.  COLON FINDINGS: A sessile polyp measuring 6 mm in size was found in the ascending colon.  A polypectomy was performed with a cold snare.  The resection was complete and the polyp tissue was completely retrieved.   Diverticulosis was noted in the sigmoid colon.   The colon mucosa was otherwise normal.   A right colon retroflexion was performed.  Retroflexed views revealed no abnormalities. The time to cecum=2 minutes 0 seconds.  Withdrawal time=8 minutes 45 seconds.  The scope was withdrawn and  the procedure completed. COMPLICATIONS: There were no complications.  ENDOSCOPIC IMPRESSION: 1.   Sessile polyp measuring 6 mm in size was found in the ascending colon; polypectomy was performed with a cold snare 2.   Diverticulosis was noted in the sigmoid colon 3.   The colon mucosa was otherwise normal - excellent prep in patient with single adenomas 2004 and 2009  RECOMMENDATIONS: Timing of repeat colonoscopy will be determined by pathology findings.   eSigned:  Gatha Mayer, MD, [facility] 07/18/2013 4:32 PM   cc: The Patient

## 2013-07-18 NOTE — Progress Notes (Signed)
Called to room to assist during endoscopic procedure.  Patient ID and intended procedure confirmed with present staff. Received instructions for my participation in the procedure from the performing physician.  

## 2013-07-18 NOTE — Progress Notes (Signed)
Patient denies any allergies to eggs or soy. Patient denies any problems with anesthesia.  

## 2013-07-18 NOTE — Patient Instructions (Addendum)
I found and removed one small polyp that looks benign (not cancer). You also have diverticulosis as before.  I will let you know pathology results and when to have another routine colonoscopy by mail.  I appreciate the opportunity to care for you. Gatha Mayer, MD, FACG   YOU HAD AN ENDOSCOPIC PROCEDURE TODAY AT [facility]: Refer to the procedure report that was given to you for any specific questions about what was found during the examination.  If the procedure report does not answer your questions, please call your gastroenterologist to clarify.  If you requested that your care partner not be given the details of your procedure findings, then the procedure report has been included in a sealed envelope for you to review at your convenience later.  YOU SHOULD EXPECT: Some feelings of bloating in the abdomen. Passage of more gas than usual.  Walking can help get rid of the air that was put into your GI tract during the procedure and reduce the bloating. If you had a lower endoscopy (such as a colonoscopy or flexible sigmoidoscopy) you may notice spotting of blood in your stool or on the toilet paper. If you underwent a bowel prep for your procedure, then you may not have a normal bowel movement for a few days.  DIET: Your first meal following the procedure should be a light meal and then it is ok to progress to your normal diet.  A half-sandwich or bowl of soup is an example of a good first meal.  Heavy or fried foods are harder to digest and may make you feel nauseous or bloated.  Likewise meals heavy in dairy and vegetables can cause extra gas to form and this can also increase the bloating.  Drink plenty of fluids but you should avoid alcoholic beverages for 24 hours.  ACTIVITY: Your care partner should take you home directly after the procedure.  You should plan to take it easy, moving slowly for the rest of the day.  You can resume normal activity the day after the procedure  however you should NOT DRIVE or use heavy machinery for 24 hours (because of the sedation medicines used during the test).    SYMPTOMS TO REPORT IMMEDIATELY: A gastroenterologist can be reached at any hour.  During normal business hours, 8:30 AM to 5:00 PM Monday through Friday, call [phone].  After hours and on weekends, please call the GI answering service at [phone] who will take a message and have the physician on call contact you.   Following lower endoscopy (colonoscopy or flexible sigmoidoscopy):  Excessive amounts of blood in the stool  Significant tenderness or worsening of abdominal pains  Swelling of the abdomen that is new, acute  Fever of 100F or higher  Following upper endoscopy (EGD)  Vomiting of blood or coffee ground material  New chest pain or pain under the shoulder blades  Painful or persistently difficult swallowing  New shortness of breath  Fever of 100F or higher  Black, tarry-looking stools  FOLLOW UP: If any biopsies were taken you will be contacted by phone or by letter within the next 1-3 weeks.  Call your gastroenterologist if you have not heard about the biopsies in 3 weeks.  Our staff will call the home number listed on your records the next business day following your procedure to check on you and address any questions or concerns that you may have at that time regarding the information given to you following  your procedure. This is a courtesy call and so if there is no answer at the home number and we have not heard from you through the emergency physician on call, we will assume that you have returned to your regular daily activities without incident.  SIGNATURES/CONFIDENTIALITY: You and/or your care partner have signed paperwork which will be entered into your electronic medical record.  These signatures attest to the fact that that the information above on your After Visit Summary has been reviewed and is understood.  Full responsibility of  the confidentiality of this discharge information lies with you and/or your care-partner.   Information on polyps and diverticulosis given to you today

## 2013-07-18 NOTE — Progress Notes (Signed)
Lidocaine-40mg IV prior to Propofol InductionPropofol given over incremental dosages 

## 2013-07-19 ENCOUNTER — Telehealth: Payer: Self-pay | Admitting: Family Medicine

## 2013-07-19 ENCOUNTER — Telehealth: Payer: Self-pay | Admitting: *Deleted

## 2013-07-19 NOTE — Telephone Encounter (Signed)
  Follow up Call-  Call back number 07/18/2013  Post procedure Call Back phone  # [phone]  Permission to leave phone message Yes     Patient questions:  Do you have a fever, pain , or abdominal swelling? no Pain Score  0 *  Have you tolerated food without any problems? yes  Have you been able to return to your normal activities? yes  Do you have any questions about your discharge instructions: Diet   no Medications  no Follow up visit  no  Do you have questions or concerns about your Care? no  Actions: * If pain score is 4 or above: No action needed, pain <4.

## 2013-07-23 ENCOUNTER — Other Ambulatory Visit: Payer: Self-pay | Admitting: Family Medicine

## 2013-07-23 MED ORDER — DOXEPIN HCL 3 MG PO TABS: 3.0000 mg | ORAL_TABLET | Freq: Every day | ORAL | Status: DC

## 2013-07-23 NOTE — Telephone Encounter (Signed)
Patient aware.

## 2013-07-23 NOTE — Telephone Encounter (Signed)
Call patient :will need to cut the dose to 3 mg silenor which is the low dose version. Prescription refilled & sent to pharmacy in [place].

## 2013-07-26 ENCOUNTER — Telehealth: Payer: Self-pay | Admitting: Family Medicine

## 2013-07-27 NOTE — Telephone Encounter (Signed)
Call patient : Prescription refilled & sent to pharmacy in [facility]. 

## 2013-07-30 ENCOUNTER — Telehealth: Payer: Self-pay | Admitting: Family Medicine

## 2013-07-30 ENCOUNTER — Encounter: Payer: Self-pay | Admitting: Internal Medicine

## 2013-07-30 ENCOUNTER — Other Ambulatory Visit: Payer: Self-pay | Admitting: *Deleted

## 2013-07-30 NOTE — Progress Notes (Signed)
Quick Note:  6 mm adenoma - repeat colonoscopy 2020 ______

## 2013-07-31 ENCOUNTER — Other Ambulatory Visit: Payer: Self-pay | Admitting: Family Medicine

## 2013-07-31 DIAGNOSIS — G4700 Insomnia, unspecified: Secondary | ICD-10-CM

## 2013-07-31 MED ORDER — TRAZODONE 25 MG HALF TABLET: ORAL_TABLET | ORAL | Status: DC

## 2013-07-31 NOTE — Telephone Encounter (Signed)
Call patient :stop doxepin switch to trazodone. Prescription refilled & sent to pharmacy in [place].

## 2013-07-31 NOTE — Telephone Encounter (Signed)
rx called to [place]

## 2014-01-09 ENCOUNTER — Ambulatory Visit: Payer: Medicare Other | Admitting: Family Medicine

## 2014-01-15 ENCOUNTER — Ambulatory Visit: Payer: Medicare Other | Admitting: Family Medicine

## 2014-01-23 ENCOUNTER — Ambulatory Visit (INDEPENDENT_AMBULATORY_CARE_PROVIDER_SITE_OTHER): Payer: Medicare Other | Admitting: Family Medicine

## 2014-01-23 ENCOUNTER — Encounter: Payer: Self-pay | Admitting: Family Medicine

## 2014-01-23 VITALS — BP 128/76 | HR 82 | Temp 98.6°F | Ht 66.0 in | Wt 170.8 lb

## 2014-01-23 DIAGNOSIS — E785 Hyperlipidemia, unspecified: Secondary | ICD-10-CM

## 2014-01-23 DIAGNOSIS — R413 Other amnesia: Secondary | ICD-10-CM

## 2014-01-23 MED ORDER — LORAZEPAM 0.5 MG PO TABS: 0.5000 mg | ORAL_TABLET | Freq: Every evening | ORAL | Status: DC | PRN

## 2014-01-23 NOTE — Progress Notes (Signed)
   Subjective:    Patient ID: Jamie [name], female    DOB: 01-Apr-1951, 63 y.o.   MRN: [id number]  HPI  63 year old female who is here to followup lipids and memory appear she had a history of lung cancer with brain metastases apparently dating back to 2003. She had surgery and since then has had some memory loss. She is able to function independently and drive her car locally. She takes Crestor and fenofibrate for her lipids. Labs from March 2015 R. at goal and we'll repeat these again at the one-year anniversary.    Review of Systems  Psychiatric/Behavioral: Positive for sleep disturbance and decreased concentration.  All other systems reviewed and are negative.      Objective:   Physical Exam  Constitutional: She is oriented to person, place, and time. She appears well-developed and well-nourished.  Eyes: Conjunctivae and EOM are normal.  Neck: Normal range of motion. Neck supple.  Cardiovascular: Normal rate, regular rhythm and normal heart sounds.   Pulmonary/Chest: Effort normal and breath sounds normal.  Abdominal: Soft. Bowel sounds are normal.  Musculoskeletal: Normal range of motion.  Neurological: She is alert and oriented to person, place, and time. She has normal reflexes.  Skin: Skin is warm and dry.  Psychiatric: She has a normal mood and affect. Her behavior is normal. Thought content normal.          Assessment & Plan:  BP 128/76  Pulse 82  Temp(Src) 98.6 F (37 C) (Oral)  Ht 5\' 6"  (1.676 m)  Wt 170 lb 12.8 oz (77.474 kg)  BMI 27.58 kg/m2  1. Memory impairment Recalled 1 of 3 items at 5 min; encourage reading, crosswords, etc  2. Hyperlipidemia Continue crestor but may omit fenofibrate; will re-check in Mar, 2016  Wardell Honour MD

## 2014-02-14 ENCOUNTER — Other Ambulatory Visit: Payer: Self-pay | Admitting: Family Medicine

## 2014-02-18 ENCOUNTER — Telehealth: Payer: Self-pay | Admitting: Family Medicine

## 2014-02-18 NOTE — Telephone Encounter (Signed)
Patient last seen in office on 01-23-14. Rx last filled on 01-23-14 for #20. Please advise. If approved please route to Pool A so nurse can phone in to pharmacy

## 2014-02-20 NOTE — Telephone Encounter (Signed)
Called in.

## 2014-04-18 ENCOUNTER — Other Ambulatory Visit: Payer: Self-pay | Admitting: Family Medicine

## 2014-04-18 NOTE — Telephone Encounter (Signed)
Last seen 01/23/14 DR Sabra Heck   If approved route to nurse to call into [facility]

## 2014-04-18 NOTE — Telephone Encounter (Signed)
Ok'd per verbal order from Dr. Sabra Heck. Called to [facility]

## 2014-05-24 ENCOUNTER — Other Ambulatory Visit: Payer: Self-pay | Admitting: Family Medicine

## 2014-05-24 NOTE — Telephone Encounter (Signed)
Called into pharmacy

## 2014-05-24 NOTE — Telephone Encounter (Signed)
Please call in ativan with 1 refills 

## 2014-05-24 NOTE — Telephone Encounter (Signed)
Last saw Sabra Heck 01/23/14, last filled 01/07/6. Call into [facility]

## 2014-06-19 ENCOUNTER — Other Ambulatory Visit: Payer: Self-pay

## 2014-06-19 DIAGNOSIS — E785 Hyperlipidemia, unspecified: Secondary | ICD-10-CM

## 2014-06-19 MED ORDER — ROSUVASTATIN CALCIUM 10 MG PO TABS: 10.0000 mg | ORAL_TABLET | Freq: Every day | ORAL | Status: DC

## 2014-06-19 NOTE — Telephone Encounter (Signed)
Last seen 01/23/14 Dr Sabra Heck  Last lipid 3/15

## 2014-07-16 DIAGNOSIS — Z1231 Encounter for screening mammogram for malignant neoplasm of breast: Secondary | ICD-10-CM | POA: Diagnosis not present

## 2014-08-01 ENCOUNTER — Encounter: Payer: Self-pay | Admitting: Family Medicine

## 2014-08-01 ENCOUNTER — Ambulatory Visit (INDEPENDENT_AMBULATORY_CARE_PROVIDER_SITE_OTHER): Payer: Medicare Other | Admitting: Family Medicine

## 2014-08-01 VITALS — BP 134/80 | HR 81 | Temp 97.3°F | Ht 66.0 in | Wt 174.0 lb

## 2014-08-01 DIAGNOSIS — Z85841 Personal history of malignant neoplasm of brain: Secondary | ICD-10-CM | POA: Diagnosis not present

## 2014-08-01 DIAGNOSIS — E785 Hyperlipidemia, unspecified: Secondary | ICD-10-CM | POA: Diagnosis not present

## 2014-08-01 DIAGNOSIS — R413 Other amnesia: Secondary | ICD-10-CM

## 2014-08-01 DIAGNOSIS — Z85118 Personal history of other malignant neoplasm of bronchus and lung: Secondary | ICD-10-CM | POA: Diagnosis not present

## 2014-08-01 DIAGNOSIS — N189 Chronic kidney disease, unspecified: Secondary | ICD-10-CM | POA: Diagnosis not present

## 2014-08-01 MED ORDER — LORAZEPAM 0.5 MG PO TABS: 0.5000 mg | ORAL_TABLET | Freq: Every evening | ORAL | Status: DC | PRN

## 2014-08-01 NOTE — Progress Notes (Signed)
 Subjective:    Patient ID: Jamie [name], female    DOB: 09/16/1950, 63 y.o.   MRN: [id number]  HPI Pt here for follow up and management of chronic medical problems which includes hyperlipidemia and memory impairment.  63-year-old lady with history of lung cancer, status post resection with metastases to brain with radiation. This occurred in 2001 and it is not believed that she has been released from care with treatment greater than 5 years ago.  She does complain of some intermittent low back pain. It is not present today but when it hurts is more midline. There is no radiation to the legs or symptoms of bowel or bladder dysfunction.    Patient Active Problem List   Diagnosis Date Noted  . Personal history of colonic polyps - adenomas 07/18/2013  . Brain cancer   . Lung cancer   . CKD (chronic kidney disease) 07/27/2012  . Syncope 03/22/2011  . Hypotension 03/22/2011  . Memory impairment 03/22/2011  . History of cancer 03/22/2011  . Hyperlipidemia 03/22/2011   Outpatient Encounter Prescriptions as of 08/01/2014  Medication Sig  . aspirin EC 81 MG tablet Take 81 mg by mouth daily.    . Calcium-Magnesium-Vitamin D (CALCIUM MAGNESIUM PO) Take 1 tablet by mouth daily.    . folic acid (FOLVITE) 400 MCG tablet Take 400 mcg by mouth daily.  . LORazepam (ATIVAN) 0.5 MG tablet TAKE 1 TABLET AT BEDTIME AS NEEDED FOR SLEEP  . Probiotic Product (PROBIOTIC DAILY PO) Take 1 capsule by mouth daily.  . rosuvastatin (CRESTOR) 10 MG tablet Take 1 tablet (10 mg total) by mouth at bedtime.  . [DISCONTINUED] DiphenhydrAMINE HCl (ALLERGY MED PO) Take 1 tablet by mouth at bedtime.  . [DISCONTINUED] fenofibrate 160 MG tablet Take 1 tablet (160 mg total) by mouth daily.  . [DISCONTINUED] magnesium oxide (MAG-OX) 400 MG tablet Take 400 mg by mouth daily.  . [DISCONTINUED] valACYclovir (VALTREX) 1000 MG tablet TAKE 2 TABLETS AT ONSET THEN 2 TABLETS 1 HOURS LATER    Review of Systems  Constitutional:  Negative.   HENT: Negative.   Eyes: Negative.   Respiratory: Negative.   Cardiovascular: Negative.   Gastrointestinal: Negative.   Endocrine: Negative.   Genitourinary: Negative.   Musculoskeletal: Positive for back pain.  Skin: Negative.   Allergic/Immunologic: Negative.   Neurological: Negative.   Hematological: Negative.   Psychiatric/Behavioral: Negative.        Objective:   Physical Exam  Constitutional: She is oriented to person, place, and time. She appears well-developed and well-nourished.  Cardiovascular: Normal rate and regular rhythm.   Pulmonary/Chest: Effort normal and breath sounds normal.  Abdominal: Soft.  Musculoskeletal: Normal range of motion.  Neurological: She is alert and oriented to person, place, and time.   BP 134/80 mmHg  Pulse 81  Temp(Src) 97.3 F (36.3 C) (Oral)  Ht 5' 6" (1.676 m)  Wt 174 lb (78.926 kg)  BMI 28.10 kg/m2        Assessment & Plan:  1. Memory impairment This is a function of her metastatic to brain lung cancer and radiation treatments. This seems stable and she has no problem functioning in day-to-day life.  2. Hyperlipidemia Had previously discontinued fenofibrate. Will need to see how triglycerides are today - CMP14+EGFR - Lipid panel  3. CKD (chronic kidney disease), unspecified stage Most recent renal function from one year ago was normal when need to redefine her kidney function today - CMP14+EGFR  4. Hx of cancer of lung With   diagnosis and treatment 15 years ago this is past history  5. Hx of brain cancer Same as lung cancer; past history  Wardell Honour MD

## 2014-08-01 NOTE — Patient Instructions (Signed)
Continue current medications. Continue good therapeutic lifestyle changes which include good diet and exercise. Fall precautions discussed with patient. If an FOBT was given today- please return it to our front desk. If you are over 62 years old - you may need Prevnar 25 or the adult Pneumonia vaccine.  Flu Shots are still available at our office. If you still haven't had one please call to set up a nurse visit to get one.   After your visit with [place] today you will receive a survey in the mail or online from [employer] regarding your care with [place]. Please take a moment to fill this out. Your feedback is very important to [place] as you can help [place] better understand your patient needs as well as improve your experience and satisfaction. WE CARE ABOUT YOU!!!

## 2014-08-02 LAB — CMP14+EGFR
ALT: 18 IU/L (ref 0–32)
AST: 21 IU/L (ref 0–40)
Albumin/Globulin Ratio: 2.3 (ref 1.1–2.5)
Albumin: 4.8 g/dL (ref 3.6–4.8)
Alkaline Phosphatase: 63 IU/L (ref 39–117)
BUN/Creatinine Ratio: 13 (ref 11–26)
BUN: 12 mg/dL (ref 8–27)
Bilirubin Total: 0.3 mg/dL (ref 0.0–1.2)
CO2: 26 mmol/L (ref 18–29)
Calcium: 10.3 mg/dL (ref 8.7–10.3)
Chloride: 103 mmol/L (ref 97–108)
Creatinine, Ser: 0.96 mg/dL (ref 0.57–1.00)
GFR calc Af Amer: 74 mL/min/{1.73_m2} (ref >59)
GFR calc non Af Amer: 64 mL/min/{1.73_m2} (ref >59)
Globulin, Total: 2.1 g/dL (ref 1.5–4.5)
Glucose: 94 mg/dL (ref 65–99)
Potassium: 4.4 mmol/L (ref 3.5–5.2)
Sodium: 145 mmol/L — ABNORMAL HIGH (ref 134–144)
Total Protein: 6.9 g/dL (ref 6.0–8.5)

## 2014-08-02 LAB — LIPID PANEL
Chol/HDL Ratio: 3.4 ratio units (ref 0.0–4.4)
Cholesterol, Total: 186 mg/dL (ref 100–199)
HDL: 55 mg/dL (ref >39)
LDL Calculated: 73 mg/dL (ref 0–99)
Triglycerides: 290 mg/dL — ABNORMAL HIGH (ref 0–149)
VLDL Cholesterol Cal: 58 mg/dL — ABNORMAL HIGH (ref 5–40)

## 2014-08-22 ENCOUNTER — Encounter: Payer: Self-pay | Admitting: Family Medicine

## 2014-09-20 ENCOUNTER — Ambulatory Visit (INDEPENDENT_AMBULATORY_CARE_PROVIDER_SITE_OTHER): Payer: Medicare Other

## 2014-09-20 ENCOUNTER — Encounter: Payer: Self-pay | Admitting: *Deleted

## 2014-09-20 ENCOUNTER — Ambulatory Visit (INDEPENDENT_AMBULATORY_CARE_PROVIDER_SITE_OTHER): Payer: Medicare Other | Admitting: *Deleted

## 2014-09-20 VITALS — BP 116/79 | HR 75 | Ht 65.0 in | Wt 167.0 lb

## 2014-09-20 DIAGNOSIS — Z0000 Encounter for general adult medical examination without abnormal findings: Secondary | ICD-10-CM

## 2014-09-20 DIAGNOSIS — Z780 Asymptomatic menopausal state: Secondary | ICD-10-CM

## 2014-09-20 NOTE — Patient Instructions (Addendum)
Eat a balanced diet with lean proteins, fruits, and vegetables. Continue current exercise habits. If able add walking for 30 minutes 3 times per week. I would also stretch daily instead of weekly.  Keep follow up appointment with Dr Sabra Heck Return FOBT Review Advance Directives with family. Complete and bring copy to our office. Move carefully to avoid falls  Health Maintenance Adopting a healthy lifestyle and getting preventive care can go a long way to promote health and wellness. Talk with your health care provider about what schedule of regular examinations is right for you. This is a good chance for you to check in with your provider about disease prevention and staying healthy. In between checkups, there are plenty of things you can do on your own. Experts have done a lot of research about which lifestyle changes and preventive measures are most likely to keep you healthy. Ask your health care provider for more information. WEIGHT AND DIET  Eat a healthy diet  Be sure to include plenty of vegetables, fruits, low-fat dairy products, and lean protein.  Do not eat a lot of foods high in solid fats, added sugars, or salt.  Get regular exercise. This is one of the most important things you can do for your health.  Most adults should exercise for at least 150 minutes each week. The exercise should increase your heart rate and make you sweat (moderate-intensity exercise).  Most adults should also do strengthening exercises at least twice a week. This is in addition to the moderate-intensity exercise.  Maintain a healthy weight  Body mass index (BMI) is a measurement that can be used to identify possible weight problems. It estimates body fat based on height and weight. Your health care provider can help determine your BMI and help you achieve or maintain a healthy weight.  For females 84 years of age and older:   A BMI below 18.5 is considered underweight.  A BMI of 18.5 to 24.9 is  normal.  A BMI of 25 to 29.9 is considered overweight.  A BMI of 30 and above is considered obese.  Watch levels of cholesterol and blood lipids  You should start having your blood tested for lipids and cholesterol at 62 years of age, then have this test every 5 years.  You may need to have your cholesterol levels checked more often if:  Your lipid or cholesterol levels are high.  You are older than 62 years of age.  You are at high risk for heart disease.  CANCER SCREENING   Lung Cancer  Lung cancer screening is recommended for adults 75-22 years old who are at high risk for lung cancer because of a history of smoking.  A yearly low-dose CT scan of the lungs is recommended for people who:  Currently smoke.  Have quit within the past 15 years.  Have at least a 30-pack-year history of smoking. A pack year is smoking an average of one pack of cigarettes a day for 1 year.  Yearly screening should continue until it has been 15 years since you quit.  Yearly screening should stop if you develop a health problem that would prevent you from having lung cancer treatment.  Breast Cancer  Practice breast self-awareness. This means understanding how your breasts normally appear and feel.  It also means doing regular breast self-exams. Let your health care provider know about any changes, no matter how small.  If you are in your 20s or 30s, you should have a clinical breast  exam (CBE) by a health care provider every 1-3 years as part of a regular health exam.  If you are 29 or older, have a CBE every year. Also consider having a breast X-ray (mammogram) every year.  If you have a family history of breast cancer, talk to your health care provider about genetic screening.  If you are at high risk for breast cancer, talk to your health care provider about having an MRI and a mammogram every year.  Breast cancer gene (BRCA) assessment is recommended for women who have family members  with BRCA-related cancers. BRCA-related cancers include:  Breast.  Ovarian.  Tubal.  Peritoneal cancers.  Results of the assessment will determine the need for genetic counseling and BRCA1 and BRCA2 testing. Cervical Cancer Routine pelvic examinations to screen for cervical cancer are no longer recommended for nonpregnant women who are considered low risk for cancer of the pelvic organs (ovaries, uterus, and vagina) and who do not have symptoms. A pelvic examination may be necessary if you have symptoms including those associated with pelvic infections. Ask your health care provider if a screening pelvic exam is right for you.   The Pap test is the screening test for cervical cancer for women who are considered at risk.  If you had a hysterectomy for a problem that was not cancer or a condition that could lead to cancer, then you no longer need Pap tests.  If you are older than 65 years, and you have had normal Pap tests for the past 10 years, you no longer need to have Pap tests.  If you have had past treatment for cervical cancer or a condition that could lead to cancer, you need Pap tests and screening for cancer for at least 20 years after your treatment.  If you no longer get a Pap test, assess your risk factors if they change (such as having a new sexual partner). This can affect whether you should start being screened again.  Some women have medical problems that increase their chance of getting cervical cancer. If this is the case for you, your health care provider may recommend more frequent screening and Pap tests.  The human papillomavirus (HPV) test is another test that may be used for cervical cancer screening. The HPV test looks for the virus that can cause cell changes in the cervix. The cells collected during the Pap test can be tested for HPV.  The HPV test can be used to screen women 37 years of age and older. Getting tested for HPV can extend the interval between normal  Pap tests from three to five years.  An HPV test also should be used to screen women of any age who have unclear Pap test results.  After 62 years of age, women should have HPV testing as often as Pap tests.  Colorectal Cancer  This type of cancer can be detected and often prevented.  Routine colorectal cancer screening usually begins at 62 years of age and continues through 62 years of age.  Your health care provider may recommend screening at an earlier age if you have risk factors for colon cancer.  Your health care provider may also recommend using home test kits to check for hidden blood in the stool.  A small camera at the end of a tube can be used to examine your colon directly (sigmoidoscopy or colonoscopy). This is done to check for the earliest forms of colorectal cancer.  Routine screening usually begins at age 40.  Direct examination of the colon should be repeated every 5-10 years through 62 years of age. However, you may need to be screened more often if early forms of precancerous polyps or small growths are found. Skin Cancer  Check your skin from head to toe regularly.  Tell your health care provider about any new moles or changes in moles, especially if there is a change in a mole's shape or color.  Also tell your health care provider if you have a mole that is larger than the size of a pencil eraser.  Always use sunscreen. Apply sunscreen liberally and repeatedly throughout the day.  Protect yourself by wearing long sleeves, pants, a wide-brimmed hat, and sunglasses whenever you are outside. HEART DISEASE, DIABETES, AND HIGH BLOOD PRESSURE   Have your blood pressure checked at least every 1-2 years. High blood pressure causes heart disease and increases the risk of stroke.  If you are between 90 years and 33 years old, ask your health care provider if you should take aspirin to prevent strokes.  Have regular diabetes screenings. This involves taking a blood  sample to check your fasting blood sugar level.  If you are at a normal weight and have a low risk for diabetes, have this test once every three years after 62 years of age.  If you are overweight and have a high risk for diabetes, consider being tested at a younger age or more often. PREVENTING INFECTION  Hepatitis B  If you have a higher risk for hepatitis B, you should be screened for this virus. You are considered at high risk for hepatitis B if:  You were born in a country where hepatitis B is common. Ask your health care provider which countries are considered high risk.  Your parents were born in a high-risk country, and you have not been immunized against hepatitis B (hepatitis B vaccine).  You have HIV or AIDS.  You use needles to inject street drugs.  You live with someone who has hepatitis B.  You have had sex with someone who has hepatitis B.  You get hemodialysis treatment.  You take certain medicines for conditions, including cancer, organ transplantation, and autoimmune conditions. Hepatitis C  Blood testing is recommended for:  Everyone born from [birth year] through 1963.  Anyone with known risk factors for hepatitis C. Sexually transmitted infections (STIs)  You should be screened for sexually transmitted infections (STIs) including gonorrhea and chlamydia if:  You are sexually active and are younger than 62 years of age.  You are older than 62 years of age and your health care provider tells you that you are at risk for this type of infection.  Your sexual activity has changed since you were last screened and you are at an increased risk for chlamydia or gonorrhea. Ask your health care provider if you are at risk.  If you do not have HIV, but are at risk, it may be recommended that you take a prescription medicine daily to prevent HIV infection. This is called pre-exposure prophylaxis (PrEP). You are considered at risk if:  You are sexually active and do not  regularly use condoms or know the HIV status of your partner(s).  You take drugs by injection.  You are sexually active with a partner who has HIV. Talk with your health care provider about whether you are at high risk of being infected with HIV. If you choose to begin PrEP, you should first be tested for HIV. You should then be  tested every 3 months for as long as you are taking PrEP.  PREGNANCY   If you are premenopausal and you may become pregnant, ask your health care provider about preconception counseling.  If you may become pregnant, take 400 to 800 micrograms (mcg) of folic acid every day.  If you want to prevent pregnancy, talk to your health care provider about birth control (contraception). OSTEOPOROSIS AND MENOPAUSE   Osteoporosis is a disease in which the bones lose minerals and strength with aging. This can result in serious bone fractures. Your risk for osteoporosis can be identified using a bone density scan.  If you are 14 years of age or older, or if you are at risk for osteoporosis and fractures, ask your health care provider if you should be screened.  Ask your health care provider whether you should take a calcium or vitamin D supplement to lower your risk for osteoporosis.  Menopause may have certain physical symptoms and risks.  Hormone replacement therapy may reduce some of these symptoms and risks. Talk to your health care provider about whether hormone replacement therapy is right for you.  HOME CARE INSTRUCTIONS   Schedule regular health, dental, and eye exams.  Stay current with your immunizations.   Do not use any tobacco products including cigarettes, chewing tobacco, or electronic cigarettes.  If you are pregnant, do not drink alcohol.  If you are breastfeeding, limit how much and how often you drink alcohol.  Limit alcohol intake to no more than 1 drink per day for nonpregnant women. One drink equals 12 ounces of beer, 5 ounces of wine, or 1  ounces of hard liquor.  Do not use street drugs.  Do not share needles.  Ask your health care provider for help if you need support or information about quitting drugs.  Tell your health care provider if you often feel depressed.  Tell your health care provider if you have ever been abused or do not feel safe at home. Document Released: 10/19/2010 Document Revised: 08/20/2013 Document Reviewed: 03/07/2013 [facility] Patient Information 2015 [place], [place]. This information is not intended to replace advice given to you by your health care provider. Make sure you discuss any questions you have with your health care provider.

## 2014-09-20 NOTE — Progress Notes (Signed)
Subjective:   Jamie [name] is a 64 y.o. female who presents for an Initial Medicare Annual Wellness Visit. Jamie [name] is not married and lives at home. She has 2 adult sons and 2 grandchildren. She isn't currently active any community organizations. She once enjoyed going to yard sales with a friend but hasn't done that recently.   Review of Systems      Cardiac Risk Factors include: smoking/ tobacco exposure   HEENT: Decreased hearing-evaluated by audiology  Musculoskeletal: Mild bilateral knee pain  Problems with balance and gait. She has problems with a wandering gait and walking into the wall or other objects. This has worsened over past 6-8 months. States that balance disturbance was the first symptom she had of cancer.      Objective:    Today's Vitals   09/20/14 0949  BP: 116/79  Pulse: 75  Height: '5\' 5"'$  (1.651 m)  Weight: 167 lb (75.751 kg)    Current Medications (verified) Outpatient Encounter Prescriptions as of 09/20/2014  Medication Sig  . aspirin EC 81 MG tablet Take 81 mg by mouth daily.    Marland Kitchen LORazepam (ATIVAN) 0.5 MG tablet Take 1 tablet (0.5 mg total) by mouth at bedtime as needed. for sleep  . Multiple Vitamins-Minerals (ALIVE WOMENS ENERGY PO) Take 1 tablet by mouth daily.  . rosuvastatin (CRESTOR) 10 MG tablet Take 1 tablet (10 mg total) by mouth at bedtime.  . [DISCONTINUED] Probiotic Product (PROBIOTIC DAILY PO) Take 1 capsule by mouth daily.  . folic acid (FOLVITE) 119 MCG tablet Take 400 mcg by mouth daily.  . [DISCONTINUED] Calcium-Magnesium-Vitamin D (CALCIUM MAGNESIUM PO) Take 1 tablet by mouth daily.     No facility-administered encounter medications on file as of 09/20/2014.    Allergies (verified) Fosamax and Sulfonamide derivatives   History: Past Medical History  Diagnosis Date  . High cholesterol   . CKD (chronic kidney disease), stage III   . Cancer   . Brain cancer   . Lung cancer   . Insomnia    Past Surgical History  Procedure  Laterality Date  . Catarac Bilateral   . Brain surgery  2001  . Abdominal hysterectomy  1986  . Lobectomy     Family History  Problem Relation Age of Onset  . Cancer Mother     cirrhois of liver  . Heart attack Father   . Colon cancer Father   . Asthma Brother    Social History   Occupational History  . Not on file.   Social History Main Topics  . Smoking status: Former Smoker -- 33 years    Types: Cigarettes    Quit date: 04/20/1999  . Smokeless tobacco: Never Used  . Alcohol Use: 1.8 oz/week    3 Cans of beer per week     Comment: occassional Saturday night 3 dark beer.  . Drug Use: No  . 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    Activities of Daily Living In your present state of health, do you have any difficulty performing the following activities: 09/20/2014  Hearing? N  Vision? Y  Difficulty concentrating or making decisions? Y  Walking or climbing stairs? Y  Dressing or bathing? N  Doing errands, shopping? N  Preparing Food and eating ? N  Using the Toilet? N  In the past six months, have you accidently leaked urine? N  Do you have problems with loss of bowel control? N  Managing your  Medications? N  Managing your Finances? N  Housekeeping or managing your Housekeeping? N   Jamie [name] has been evaluated by Larene Pickett in [place] and was told that she would benefit from a hearing device. She hasn't been able to afford one though.   She does have some trouble with vision in her left eye. She has had laser surgery by Dr Zadie Rhine but is unsure of her diagnosis. She states that he told her she wouldn't go blind.  She does have some trouble climbing stairs because of her balance/gait problems. She is able to maneuver them with handrails. She does not have stairs in her home.   Immunizations and Health Maintenance Immunization History  Administered Date(s) Administered  . Tdap 09/13/2012   There are no preventive care reminders to  display for this patient.  Patient Care Team: Wardell Honour, MD as PCP - General (Family Medicine) Gatha Mayer, MD as Consulting Physician (Gastroenterology) Owens Loffler, MD as Physician Assistant (Specialist) Lavonna Monarch, MD as Consulting Physician (Dermatology)  Mammogram with [facility] mobile breast unit in 06/2014.    Assessment:   This is a routine wellness examination for Jamie [name].   Hearing/Vision screen No hearing or vision deficit noted during visit today.  Dietary issues and exercise activities discussed: Current Exercise Habits:: Home exercise routine, Type of exercise: stretching;walking (Patient push mowes her yard for 45 minutes each week during mowing season. She also stretches and does chair exercises weekly. ), Time (Minutes): 30, Frequency (Times/Week): 2, Weekly Exercise (Minutes/Week): 60, Intensity: Moderate  Goals    . Exercise 3x per week (30 min per time)     Walk for 30 minutes 3 times per week      Depression Screen PHQ 2/9 Scores 09/20/2014 08/01/2014 01/23/2014  PHQ - 2 Score 1 0 0    Fall Risk Fall Risk  09/20/2014 08/01/2014 08/01/2014 01/23/2014  Falls in the past year? Yes Yes No No  Number falls in past yr: 1 1 - -  Injury with Fall? No No - -  Risk for fall due to : History of fall(s);Impaired balance/gait - - -  Risk for fall due to (comments): reports that balance has worsened over past 6-8 months. History of brain cancer and surgery - - -   Patient has interest in doing activities but often doesn't have the physical energy to do them.   Cognitive Function: MMSE - Mini Mental State Exam 09/20/2014  Orientation to time 5  Orientation to Place 4  Registration 3  Attention/ Calculation 0  Recall 2  Language- name 2 objects 2  Language- repeat 1  Language- follow 3 step command 3  Language- read & follow direction 1  Write a sentence 0  Copy design 0  Total score 21   Patient declined to do several parts of this test. She states she has  difficulty spelling. Although she has documented memory loss this doesn't seem to affect her daily living.   Screening Tests Health Maintenance  Topic Date Due  . INFLUENZA VACCINE  01/24/2015 (Originally 11/18/2014)  . HIV Screening  01/31/2015 (Originally 10/04/1965)  . MAMMOGRAM  07/25/2015  . PAP SMEAR  07/28/2015  . COLONOSCOPY  07/19/2018  . TETANUS/TDAP  09/14/2022  . ZOSTAVAX  Completed      Plan:   Eat a balanced diet with lean proteins, fruits, and vegetables. Continue current exercise habits. If able add walking for 30 minutes 3 times per week. I would also stretch daily instead of weekly.  Keep follow up appointment with Dr Sabra Heck to discuss balance problems Return FOBT Review Advance Directives with family. Complete and bring copy to our office. Move carefully to avoid falls  During the course of the visit, Jalasia was educated and counseled about the following appropriate screening and preventive services:   Vaccines to include Pneumoccal-declined prevnar, Influenza-suggested annually, Hepatitis B-declined, Td-given 09/13/2012  Electrocardiogram-Due at next visit  Cardiovascular disease screening-Lipids monitored with routine labs  Colorectal cancer screening-Colonoscopy due 2025. FOBT given.  Bone density screening-Done today. Will discuss results at upcoming appt  Diabetes screening-glucose monitored at routine office visits  Glaucoma screening-followed by Dr Zadie Rhine  Mammography-done 06/2014  Pap-due 2017. Hx of total hysterectomy  Nutrition counseling-balanced diet with lean proteins, fruits, and vegetables  Patient Instructions (the written plan) were given to the patient.    [name], RN 09/20/2014      I have reviewed and agree with the above AWV documentation.  Claretta Fraise, M.D.

## 2014-10-02 ENCOUNTER — Encounter: Payer: Self-pay | Admitting: Family Medicine

## 2014-10-02 ENCOUNTER — Ambulatory Visit (INDEPENDENT_AMBULATORY_CARE_PROVIDER_SITE_OTHER): Payer: Medicare Other | Admitting: Family Medicine

## 2014-10-02 VITALS — BP 117/74 | HR 75 | Temp 98.2°F | Ht 65.0 in | Wt 174.0 lb

## 2014-10-02 DIAGNOSIS — R42 Dizziness and giddiness: Secondary | ICD-10-CM

## 2014-10-02 LAB — POCT CBC
Granulocyte percent: 68.8 %G (ref 37–80)
HCT, POC: 41.9 % (ref 37.7–47.9)
Hemoglobin: 13.5 g/dL (ref 12.2–16.2)
Lymph, poc: 2.1 (ref 0.6–3.4)
MCH, POC: 29 pg (ref 27–31.2)
MCHC: 32.1 g/dL (ref 31.8–35.4)
MCV: 90.4 fL (ref 80–97)
MPV: 8.7 fL (ref 0–99.8)
POC Granulocyte: 6.1 (ref 2–6.9)
POC LYMPH PERCENT: 23.7 %L (ref 10–50)
Platelet Count, POC: 252 10*3/uL (ref 142–424)
RBC: 4.64 M/uL (ref 4.04–5.48)
RDW, POC: 12.8 %
WBC: 8.8 10*3/uL (ref 4.6–10.2)

## 2014-10-02 NOTE — Progress Notes (Signed)
   Subjective:    Patient ID: Jamie [name], female    DOB: 14-Mar-1951, 64 y.o.   MRN: [id number]  HPI 64 year old female with chief complaint of balance difficulties. She has a history of lung cancer with metastases to her brain. She describes the balance issues as lightheadedness rather than a true vertigo or turning sensation. It is worse when she gets up. There is no associated nausea vomiting or ear symptoms. There've been no increase in headaches recently.  Patient Active Problem List   Diagnosis Date Noted  . Hx of cancer of lung 08/01/2014  . Hx of brain cancer 08/01/2014  . Personal history of colonic polyps - adenomas 07/18/2013  . CKD (chronic kidney disease) 07/27/2012  . Syncope 03/22/2011  . Hypotension 03/22/2011  . Memory impairment 03/22/2011  . History of cancer 03/22/2011  . Hyperlipidemia 03/22/2011   Outpatient Encounter Prescriptions as of 10/02/2014  Medication Sig  . aspirin EC 81 MG tablet Take 81 mg by mouth daily.    . folic acid (FOLVITE) 941 MCG tablet Take 400 mcg by mouth daily.  Marland Kitchen LORazepam (ATIVAN) 0.5 MG tablet Take 1 tablet (0.5 mg total) by mouth at bedtime as needed. for sleep  . Multiple Vitamins-Minerals (ALIVE WOMENS ENERGY PO) Take 1 tablet by mouth daily.  . rosuvastatin (CRESTOR) 10 MG tablet Take 1 tablet (10 mg total) by mouth at bedtime.   No facility-administered encounter medications on file as of 10/02/2014.      Review of Systems  Constitutional: Negative.   Respiratory: Negative.   Cardiovascular: Negative.   Gastrointestinal: Negative.   Neurological: Positive for light-headedness.       Objective:   Physical Exam  Constitutional: She appears well-developed and well-nourished.  Neurological: She is alert. Coordination abnormal.  Patient has difficulty with heel to toe walking and tandem gait but Romberg is negative    BP 117/74 mmHg  Pulse 75  Temp(Src) 98.2 F (36.8 C) (Oral)  Ht '5\' 5"'$  (1.651 m)  Wt 174 lb (78.926  kg)  BMI 28.96 kg/m2  Recheck of memory: Patient recalls 2 of 3 words at 5 minutes     Assessment & Plan:  1. Lightheaded Suspect this relates to her past history of cancer. She is on no antihypertensives which could contribute or other medicine. Would like to check hemoglobin since that was omitted at her last lab drawing in April.  Wardell Honour MD - POCT CBC

## 2014-10-09 ENCOUNTER — Encounter: Payer: Self-pay | Admitting: Pharmacist

## 2014-10-09 ENCOUNTER — Ambulatory Visit (INDEPENDENT_AMBULATORY_CARE_PROVIDER_SITE_OTHER): Payer: Medicare Other | Admitting: Pharmacist

## 2014-10-09 VITALS — Ht 65.0 in | Wt 174.0 lb

## 2014-10-09 DIAGNOSIS — R296 Repeated falls: Secondary | ICD-10-CM

## 2014-10-09 DIAGNOSIS — M810 Age-related osteoporosis without current pathological fracture: Secondary | ICD-10-CM | POA: Diagnosis not present

## 2014-10-09 DIAGNOSIS — Z9181 History of falling: Secondary | ICD-10-CM | POA: Insufficient documentation

## 2014-10-09 NOTE — Progress Notes (Signed)
Patient ID: Jamie [name], female   DOB: [birth year], 64 y.o.   MRN: [id number]  [facility] Current Height: Height: '5\' 5"'$  (165.1 cm)      Max Lifetime Height:  '5\' 6"'$  Current Weight: Weight: 174 lb (78.926 kg)       Ethnicity:Caucasian    HPI: Patient with history of osteoporosis.  Has tried bisphonshonates with adverse reaction in past or she just stopped taking.   Back Pain?  No       Kyphosis?  No Prior fracture?  No Med(s) for Osteoporosis/Osteopenia:  NONE Med(s) previously tried for Osteoporosis/Osteopenia:  Fosamax +D - causes stomach pain.  Took Actonel and Boniva in past but patient cannot recall when she stopped.                                                              PMH: Age at menopause:  63; 64 yo - surgical Hysterectomy?  Yes Oophorectomy?  Yes HRT? Yes - Former.  Type/duration: estrogen - for 10 years Steroid Use?  No Thyroid med?  No History of cancer?  Yes - brain and lung cancer History of digestive disorders (ie Crohn's)?  No Current or previous eating disorders?  No Last Vitamin D Result:  61 (07/2010) Last GFR Result: 64 (08/01/2014)     FH/SH: Family history of osteoporosis?  No Parent with history of hip fracture?  No Family history of breast cancer?  No Exercise?  Yes - chair exercises and walk to mailbox Smoking?  No Alcohol?  No    Calcium Assessment Calcium Intake  # of servings/day  Calcium mg  Milk (8 oz) 1  x  300  = '300mg'$   Yogurt (4 oz) 1 x  200 = '200mg'$   Cheese (1 oz) 1 x  200 = '200mg'$   Other Calcium sources   '250mg'$   Ca supplement MVI = '400mg'$    Estimated calcium intake per day '1350mg'$     DEXA Results Date of Test T-Score for AP Spine L1-L4 T-Score for Total Left Hip T-Score for Total Right Hip  09/20/2014 -1.2 -2.5 -2.6  09/02/2010 -0.2 -2.0 -2.1  11/01/2005 -0.2 -2.1 -2.0       Lowest T-Score = -2.9 at neck of right hip / femur from 09/20/2014   Assessment: Osteoporosis - with risk of  falls  Recommendations: 1.  Start  prolia '60mg'$  inject SQ q 6 months - checking on insurance coverage. 2.  continue calcium '1200mg'$  daily through supplementation or diet.  3.  recommend weight bearing exercise - 30 minutes at least 4 days per week.  Offered patient referral for balance assessment and training with PT and she refused.  Recommended she consider Balance classes through [employer] program as well. 4.  Counseled and educated about fall risk and prevention.  Recheck DEXA:  2 years  Time spent counseling patient:  30 minutes  Cherre Robins, PharmD, CPP

## 2014-10-09 NOTE — Patient Instructions (Signed)
We will check on insurance coverage of Prolia   Denosumab injection What is this medicine? DENOSUMAB (den oh sue mab) slows bone breakdown. Prolia is used to treat osteoporosis in women after menopause and in men. Delton See is used to prevent bone fractures and other bone problems caused by cancer bone metastases. Delton See is also used to treat giant cell tumor of the bone. This medicine may be used for other purposes; ask your health care provider or pharmacist if you have questions. COMMON BRAND NAME(S): Prolia, XGEVA How should I use this medicine? This medicine is for injection under the skin. It is given by a health care professional in a hospital or clinic setting. If you are getting Prolia, a special MedGuide will be given to you by the pharmacist with each prescription and refill. Be sure to read this information carefully each time. For Prolia, talk to your pediatrician regarding the use of this medicine in children. Special care may be needed. For Delton See, talk to your pediatrician regarding the use of this medicine in children. While this drug may be prescribed for children as young as 13 years for selected conditions, precautions do apply. Overdosage: If you think you've taken too much of this medicine contact a poison control center or emergency room at once. Overdosage: If you think you have taken too much of this medicine contact a poison control center or emergency room at once. NOTE: This medicine is only for you. Do not share this medicine with others. What if I miss a dose? It is important not to miss your dose. Call your doctor or health care professional if you are unable to keep an appointment. What may interact with this medicine? Do not take this medicine with any of the following medications: -other medicines containing denosumab This medicine may also interact with the following medications: -medicines that suppress the immune system -medicines that treat cancer -steroid  medicines like prednisone or cortisone This list may not describe all possible interactions. Give your health care provider a list of all the medicines, herbs, non-prescription drugs, or dietary supplements you use. Also tell them if you smoke, drink alcohol, or use illegal drugs. Some items may interact with your medicine. What should I watch for while using this medicine? Visit your doctor or health care professional for regular checks on your progress. Your doctor or health care professional may order blood tests and other tests to see how you are doing. Call your doctor or health care professional if you get a cold or other infection while receiving this medicine. Do not treat yourself. This medicine may decrease your body's ability to fight infection. You should make sure you get enough calcium and vitamin D while you are taking this medicine, unless your doctor tells you not to. Discuss the foods you eat and the vitamins you take with your health care professional. See your dentist regularly. Brush and floss your teeth as directed. Before you have any dental work done, tell your dentist you are receiving this medicine. Do not become pregnant while taking this medicine or for 5 months after stopping it. Women should inform their doctor if they wish to become pregnant or think they might be pregnant. There is a potential for serious side effects to an unborn child. Talk to your health care professional or pharmacist for more information. What side effects may I notice from receiving this medicine? Side effects that you should report to your doctor or health care professional as soon as possible: -allergic  reactions like skin rash, itching or hives, swelling of the face, lips, or tongue -breathing problems -chest pain -fast, irregular heartbeat -feeling faint or lightheaded, falls -fever, chills, or any other sign of infection -muscle spasms, tightening, or twitches -numbness or tingling -skin  blisters or bumps, or is dry, peels, or red -slow healing or unexplained pain in the mouth or jaw -unusual bleeding or bruising Side effects that usually do not require medical attention (Report these to your doctor or health care professional if they continue or are bothersome.): -muscle pain -stomach upset, gas This list may not describe all possible side effects. Call your doctor for medical advice about side effects. You may report side effects to FDA at 1-800-FDA-1088. Where should I keep my medicine? This medicine is only given in a clinic, doctor's office, or other health care setting and will not be stored at home. NOTE: This sheet is a summary. It may not cover all possible information. If you have questions about this medicine, talk to your doctor, pharmacist, or health care provider.  2015, Elsevier/Gold Standard. (2011-10-04 12:37:47)                Exercise for Strong Bones  Exercise is important to build and maintain strong bones / bone density.  There are 2 types of exercises that are important to building and maintaining strong bones:  Weight- bearing and muscle-stregthening.  Weight-bearing Exercises  These exercises include activities that make you move against gravity while staying upright. Weight-bearing exercises can be high-impact or low-impact.  High-impact weight-bearing exercises help build bones and keep them strong. If you have broken a bone due to osteoporosis or are at risk of breaking a bone, you may need to avoid high-impact exercises. If you're not sure, you should check with your healthcare provider.  Examples of high-impact weight-bearing exercises are: Dancing  Doing high-impact aerobics  Hiking  Jogging/running  Jumping Rope  Stair climbing  Tennis  Low-impact weight-bearing exercises can also help keep bones strong and are a safe alternative if you cannot do high-impact exercises.   Examples of low-impact weight-bearing exercises are: Using  elliptical training machines  Doing low-impact aerobics  Using stair-step machines  Fast walking on a treadmill or outside   Muscle-Strengthening Exercises These exercises include activities where you move your body, a weight or some other resistance against gravity. They are also known as resistance exercises and include: Lifting weights  Using elastic exercise bands  Using weight machines  Lifting your own body weight  Functional movements, such as standing and rising up on your toes  Yoga and Pilates can also improve strength, balance and flexibility. However, certain positions may not be safe for people with osteoporosis or those at increased risk of broken bones. For example, exercises that have you bend forward may increase the chance of breaking a bone in the spine.   Non-Impact Exercises There are other types of exercises that can help prevent falls.  Non-impact exercises can help you to improve balance, posture and how well you move in everyday activities. Some of these exercises include: Balance exercises that strengthen your legs and test your balance, such as Tai Chi, can decrease your risk of falls.  Posture exercises that improve your posture and reduce rounded or "sloping" shoulders can help you decrease the chance of breaking a bone, especially in the spine.  Functional exercises that improve how well you move can help you with everyday activities and decrease your chance of falling and breaking a  bone. For example, if you have trouble getting up from a chair or climbing stairs, you should do these activities as exercises.   **A physical therapist can teach you balance, posture and functional exercises. He/she can also help you learn which exercises are safe and appropriate for you.  [facility] has a physical therapy office in [place] in front of our office and referrals can be made for assessments and treatment as needed and strength and balance training.  If you would like  to have an assessment with [facility] and our physical therapy team please let a nurse or provider know.   Fall Prevention and Home Safety Falls cause injuries and can affect all age groups. It is possible to use preventive measures to significantly decrease the likelihood of falls. There are many simple measures which can make your home safer and prevent falls. OUTDOORS  Repair cracks and edges of walkways and driveways.  Remove high doorway thresholds.  Trim shrubbery on the main path into your home.  Have good outside lighting.  Clear walkways of tools, rocks, debris, and clutter.  Check that handrails are not broken and are securely fastened. Both sides of steps should have handrails.  Have leaves, snow, and ice cleared regularly.  Use sand or salt on walkways during winter months.  In the garage, clean up grease or oil spills. BATHROOM  Install night lights.  Install grab bars by the toilet and in the tub and shower.  Use non-skid mats or decals in the tub or shower.  Place a plastic non-slip stool in the shower to sit on, if needed.  Keep floors dry and clean up all water on the floor immediately.  Remove soap buildup in the tub or shower on a regular basis.  Secure bath mats with non-slip, double-sided rug tape.  Remove throw rugs and tripping hazards from the floors. BEDROOMS  Install night lights.  Make sure a bedside light is easy to reach.  Do not use oversized bedding.  Keep a telephone by your bedside.  Have a firm chair with side arms to use for getting dressed.  Remove throw rugs and tripping hazards from the floor. KITCHEN  Keep handles on pots and pans turned toward the center of the stove. Use back burners when possible.  Clean up spills quickly and allow time for drying.  Avoid walking on wet floors.  Avoid hot utensils and knives.  Position shelves so they are not too high or low.  Place commonly used objects within easy reach.  If  necessary, use a sturdy step stool with a grab bar when reaching.  Keep electrical cables out of the way.  Do not use floor polish or wax that makes floors slippery. If you must use wax, use non-skid floor wax.  Remove throw rugs and tripping hazards from the floor. STAIRWAYS  Never leave objects on stairs.  Place handrails on both sides of stairways and use them. Fix any loose handrails. Make sure handrails on both sides of the stairways are as long as the stairs.  Check carpeting to make sure it is firmly attached along stairs. Make repairs to worn or loose carpet promptly.  Avoid placing throw rugs at the top or bottom of stairways, or properly secure the rug with carpet tape to prevent slippage. Get rid of throw rugs, if possible.  Have an electrician put in a light switch at the top and bottom of the stairs. OTHER FALL PREVENTION TIPS  Wear low-heel or rubber-soled  shoes that are supportive and fit well. Wear closed toe shoes.  When using a stepladder, make sure it is fully opened and both spreaders are firmly locked. Do not climb a closed stepladder.  Add color or contrast paint or tape to grab bars and handrails in your home. Place contrasting color strips on first and last steps.  Learn and use mobility aids as needed. Install an electrical emergency response system.  Turn on lights to avoid dark areas. Replace light bulbs that burn out immediately. Get light switches that glow.  Arrange furniture to create clear pathways. Keep furniture in the same place.  Firmly attach carpet with non-skid or double-sided tape.  Eliminate uneven floor surfaces.  Select a carpet pattern that does not visually hide the edge of steps.  Be aware of all pets. OTHER HOME SAFETY TIPS  Set the water temperature for 120 F (48.8 C).  Keep emergency numbers on or near the telephone.  Keep smoke detectors on every level of the home and near sleeping areas. Document Released: 03/26/2002  Document Revised: 10/05/2011 Document Reviewed: 06/25/2011 [facility] Patient Information 2015 [place], [place]. This information is not intended to replace advice given to you by your health care provider. Make sure you discuss any questions you have with your health care provider.

## 2014-10-11 ENCOUNTER — Telehealth: Payer: Self-pay | Admitting: Pharmacist

## 2014-10-14 MED ORDER — RALOXIFENE HCL 60 MG PO TABS: 60.0000 mg | ORAL_TABLET | Freq: Every day | ORAL | Status: DC

## 2014-10-14 NOTE — Telephone Encounter (Signed)
Print out of [employer] program printed and left at front desk for patient.   Also 80% of of prolia is covered by her insurance.  She would pay 20% or about $170 every 6 months.  This was too much per patient.   Will try evista '60mg'$  1 tablet daily.

## 2014-10-24 ENCOUNTER — Encounter: Payer: Self-pay | Admitting: Internal Medicine

## 2014-10-30 ENCOUNTER — Other Ambulatory Visit: Payer: Self-pay | Admitting: Family Medicine

## 2014-10-30 NOTE — Telephone Encounter (Signed)
Last seen 4/16  Dr Sabra Heck  If approved route to nurse to call into [facility]

## 2014-10-30 NOTE — Telephone Encounter (Signed)
Refill called to [facility] 

## 2015-01-21 ENCOUNTER — Other Ambulatory Visit: Payer: Self-pay | Admitting: Family Medicine

## 2015-05-20 ENCOUNTER — Encounter: Payer: Self-pay | Admitting: *Deleted

## 2015-07-03 ENCOUNTER — Other Ambulatory Visit: Payer: Self-pay | Admitting: *Deleted

## 2015-07-03 MED ORDER — VALACYCLOVIR HCL 1 G PO TABS: ORAL_TABLET | ORAL | Status: DC

## 2015-07-04 ENCOUNTER — Ambulatory Visit (INDEPENDENT_AMBULATORY_CARE_PROVIDER_SITE_OTHER): Payer: Medicare Other | Admitting: Family Medicine

## 2015-07-04 ENCOUNTER — Encounter: Payer: Self-pay | Admitting: Family Medicine

## 2015-07-04 VITALS — BP 111/66 | HR 76 | Temp 96.8°F | Ht 65.0 in | Wt 176.0 lb

## 2015-07-04 DIAGNOSIS — E785 Hyperlipidemia, unspecified: Secondary | ICD-10-CM | POA: Diagnosis not present

## 2015-07-04 DIAGNOSIS — G4700 Insomnia, unspecified: Secondary | ICD-10-CM

## 2015-07-04 DIAGNOSIS — N189 Chronic kidney disease, unspecified: Secondary | ICD-10-CM

## 2015-07-04 MED ORDER — DIAZEPAM 5 MG PO TABS: 5.0000 mg | ORAL_TABLET | Freq: Every evening | ORAL | Status: DC | PRN

## 2015-07-04 NOTE — Progress Notes (Signed)
   Subjective:    Patient ID: Jamie [name], female    DOB: 01-18-1951, 65 y.o.   MRN: [id number]  HPI Pt here for follow up and management of chronic medical problems which includes hyperlipidemia. She is taking medications regularly. Only complaint today is insomnia. She feels that when she takes clonazepam she has affects the next day but she does not like. In the past she took days of pain for sleep and that was effective without neck a issues. She has had no falls but she is unstable as I had to help her up on the table and off the table in the exam room.     Patient Active Problem List   Diagnosis Date Noted  . Osteoporosis 10/09/2014  . At high risk for falls 10/09/2014  . Hx of cancer of lung 08/01/2014  . Hx of brain cancer 08/01/2014  . Personal history of colonic polyps - adenomas 07/18/2013  . CKD (chronic kidney disease) stage 3, GFR 30-59 ml/min 07/27/2012  . Syncope 03/22/2011  . Hypotension 03/22/2011  . Memory impairment 03/22/2011  . History of cancer 03/22/2011  . Hyperlipidemia 03/22/2011   Outpatient Encounter Prescriptions as of 07/04/2015  Medication Sig  . aspirin EC 81 MG tablet Take 81 mg by mouth daily.    . CRESTOR 10 MG tablet Take 1 tablet (10 mg total) by mouth at bedtime.  . Multiple Vitamins-Minerals (ALIVE WOMENS ENERGY PO) Take 1 tablet by mouth daily.  . raloxifene (EVISTA) 60 MG tablet Take 1 tablet (60 mg total) by mouth daily.  . valACYclovir (VALTREX) 1000 MG tablet Take 2 tabs at onset, then 2 tabs 1 hour later  . [DISCONTINUED] LORazepam (ATIVAN) 0.5 MG tablet TAKE 1 TABLET AT BEDTIME AS NEEDED FOR SLEEP   No facility-administered encounter medications on file as of 07/04/2015.     Review of Systems  Constitutional: Negative.        Not sleeping well at night  HENT: Negative.   Eyes: Negative.   Respiratory: Negative.   Cardiovascular: Negative.   Gastrointestinal: Negative.   Endocrine: Negative.   Genitourinary: Negative.     Musculoskeletal: Negative.   Skin: Negative.   Allergic/Immunologic: Negative.   Neurological: Negative.   Hematological: Negative.   Psychiatric/Behavioral: Negative.        Objective:   Physical Exam  Constitutional: She is oriented to person, place, and time. She appears well-developed and well-nourished.  HENT:  Mouth/Throat: Oropharynx is clear and moist.  Eyes: Pupils are equal, round, and reactive to light.  Cardiovascular: Normal rate and regular rhythm.   Pulmonary/Chest: Effort normal and breath sounds normal.  Musculoskeletal: Normal range of motion.  Neurological: She is alert and oriented to person, place, and time.    BP 111/66 mmHg  Pulse 76  Temp(Src) 96.8 F (36 C) (Oral)  Ht _0  (1.651 m)  Wt 176 lb (79.833 kg)  BMI 29.29 kg/m2       Assessment & Plan:  1. Hyperlipidemia It has been almost 1 year since we checked her lipids and liver we need to update that today - Lipid panel    Wardell Honour MD2. CKD (chronic kidney disease), unspecified stage Previous GFR was 64 which would put her kidney disease at stage II. We will check today for update - CMP14+EGFR   3. Insomnia Try diazepam 5 mg to take at time. This is based on previous experience and success with this agent.

## 2015-07-04 NOTE — Patient Instructions (Signed)
Medicare Annual Wellness Visit  [place] and the medical providers at [facility] strive to bring you the best medical care.  In doing so we not only want to address your current medical conditions and concerns but also to detect new conditions early and prevent illness, disease and health-related problems.    Medicare offers a yearly Wellness Visit which allows our clinical staff to assess your need for preventative services including immunizations, lifestyle education, counseling to decrease risk of preventable diseases and screening for fall risk and other medical concerns.    This visit is provided free of charge (no copay) for all Medicare recipients. The clinical pharmacists at [facility] have begun to conduct these Wellness Visits which will also include a thorough review of all your medications.    As you primary medical provider recommend that you make an appointment for your Annual Wellness Visit if you have not done so already this year.  You may set up this appointment before you leave today or you may call back ([phone]) and schedule an appointment.  Please make sure when you call that you mention that you are scheduling your Annual Wellness Visit with the clinical pharmacist so that the appointment may be made for the proper length of time.     Continue current medications. Continue good therapeutic lifestyle changes which include good diet and exercise. Fall precautions discussed with patient. If an FOBT was given today- please return it to our front desk. If you are over 50 years old - you may need Prevnar 13 or the adult Pneumonia vaccine.  **Flu shots are available--- please call and schedule a FLU-CLINIC appointment**  After your visit with us today you will receive a survey in the mail or online from [employer] regarding your care with us. Please take a moment to fill this out. Your feedback is very  important to us as you can help us better understand your patient needs as well as improve your experience and satisfaction. WE CARE ABOUT YOU!!!    

## 2015-07-05 LAB — CMP14+EGFR
ALT: 21 IU/L (ref 0–32)
AST: 18 IU/L (ref 0–40)
Albumin/Globulin Ratio: 2 (ref 1.2–2.2)
Albumin: 4.5 g/dL (ref 3.6–4.8)
Alkaline Phosphatase: 64 IU/L (ref 39–117)
BUN/Creatinine Ratio: 16 (ref 11–26)
BUN: 16 mg/dL (ref 8–27)
Bilirubin Total: 0.5 mg/dL (ref 0.0–1.2)
CO2: 25 mmol/L (ref 18–29)
Calcium: 9.5 mg/dL (ref 8.7–10.3)
Chloride: 102 mmol/L (ref 96–106)
Creatinine, Ser: 1.03 mg/dL — ABNORMAL HIGH (ref 0.57–1.00)
GFR calc Af Amer: 67 mL/min/{1.73_m2} (ref >59)
GFR calc non Af Amer: 58 mL/min/{1.73_m2} — ABNORMAL LOW (ref >59)
Globulin, Total: 2.2 g/dL (ref 1.5–4.5)
Glucose: 101 mg/dL — ABNORMAL HIGH (ref 65–99)
Potassium: 3.8 mmol/L (ref 3.5–5.2)
Sodium: 142 mmol/L (ref 134–144)
Total Protein: 6.7 g/dL (ref 6.0–8.5)

## 2015-07-05 LAB — LIPID PANEL
Chol/HDL Ratio: 3.5 ratio units (ref 0.0–4.4)
Cholesterol, Total: 187 mg/dL (ref 100–199)
HDL: 53 mg/dL (ref >39)
LDL Calculated: 81 mg/dL (ref 0–99)
Triglycerides: 264 mg/dL — ABNORMAL HIGH (ref 0–149)
VLDL Cholesterol Cal: 53 mg/dL — ABNORMAL HIGH (ref 5–40)

## 2015-07-07 NOTE — Progress Notes (Signed)
Patient aware.

## 2015-07-18 ENCOUNTER — Encounter: Payer: Medicare Other | Admitting: *Deleted

## 2015-07-18 DIAGNOSIS — Z1231 Encounter for screening mammogram for malignant neoplasm of breast: Secondary | ICD-10-CM | POA: Diagnosis not present

## 2015-07-18 LAB — HM MAMMOGRAPHY

## 2015-07-30 ENCOUNTER — Encounter: Payer: Self-pay | Admitting: *Deleted

## 2015-07-30 ENCOUNTER — Ambulatory Visit: Payer: Medicare Other | Admitting: Pharmacist

## 2015-08-04 ENCOUNTER — Ambulatory Visit: Payer: Medicare Other | Admitting: Pharmacist

## 2015-08-06 ENCOUNTER — Encounter: Payer: Self-pay | Admitting: Family Medicine

## 2015-08-20 ENCOUNTER — Other Ambulatory Visit: Payer: Self-pay | Admitting: Family Medicine

## 2016-01-06 ENCOUNTER — Encounter: Payer: Self-pay | Admitting: Family Medicine

## 2016-01-06 ENCOUNTER — Ambulatory Visit (INDEPENDENT_AMBULATORY_CARE_PROVIDER_SITE_OTHER): Payer: Medicare Other | Admitting: Family Medicine

## 2016-01-06 VITALS — BP 115/66 | HR 68 | Temp 97.7°F | Ht 65.0 in | Wt 168.0 lb

## 2016-01-06 DIAGNOSIS — E785 Hyperlipidemia, unspecified: Secondary | ICD-10-CM | POA: Diagnosis not present

## 2016-01-06 DIAGNOSIS — N189 Chronic kidney disease, unspecified: Secondary | ICD-10-CM

## 2016-01-06 NOTE — Progress Notes (Signed)
Subjective:    Patient ID: Jamie [name], female    DOB: [birth year], 65 y.o.   MRN: [id number]  HPI Pt here for follow up and management of chronic medical problems which includes hyperlipidemia. She is taking medications regularly. She has no new problems. Her balance continues to be an issue. She had one fall on her back steps which sounds like just 2 steps. There was no injury. She continues to drive short distances around town. There've been no problems with her getting lost while driving. She has some occasional cough. There is a history of lung cancer and brain cancer. Details are not really known and she is not a good historian in that regard.     Patient Active Problem List   Diagnosis Date Noted  . Osteoporosis 10/09/2014  . At high risk for falls 10/09/2014  . Hx of cancer of lung 08/01/2014  . Hx of brain cancer 08/01/2014  . Personal history of colonic polyps - adenomas 07/18/2013  . CKD (chronic kidney disease) stage 3, GFR 30-59 ml/min 07/27/2012  . Syncope 03/22/2011  . Hypotension 03/22/2011  . Memory impairment 03/22/2011  . History of cancer 03/22/2011  . Hyperlipidemia 03/22/2011   Outpatient Encounter Prescriptions as of 01/06/2016  Medication Sig  . aspirin EC 81 MG tablet Take 81 mg by mouth daily.    . diazepam (VALIUM) 5 MG tablet Take 1 tablet (5 mg total) by mouth at bedtime as needed for anxiety.  . Multiple Vitamins-Minerals (ALIVE WOMENS ENERGY PO) Take 1 tablet by mouth daily.  . rosuvastatin (CRESTOR) 10 MG tablet TAKE 1 TABLET AT BEDTIME  . valACYclovir (VALTREX) 1000 MG tablet Take 2 tabs at onset, then 2 tabs 1 hour later  . [DISCONTINUED] raloxifene (EVISTA) 60 MG tablet Take 1 tablet (60 mg total) by mouth daily.   No facility-administered encounter medications on file as of 01/06/2016.       Review of Systems  Constitutional: Negative.   HENT: Negative.   Eyes: Negative.   Respiratory: Negative.   Cardiovascular: Negative.     Gastrointestinal: Negative.   Endocrine: Negative.   Genitourinary: Negative.   Musculoskeletal: Negative.   Skin: Negative.        Incision site hurts - right lung  Allergic/Immunologic: Negative.   Neurological: Negative.   Hematological: Negative.   Psychiatric/Behavioral: Negative.        Objective:   Physical Exam  Constitutional: She is oriented to person, place, and time. She appears well-developed and well-nourished.  Cardiovascular: Normal rate, regular rhythm and normal heart sounds.   Pulmonary/Chest: Effort normal and breath sounds normal.  Abdominal: Soft. She exhibits no mass. There is no tenderness.  Neurological: She is alert and oriented to person, place, and time.  Psychiatric: She has a normal mood and affect. Her behavior is normal.   BP 115/66 (BP Location: Left Arm)   Pulse 68   Temp 97.7 F (36.5 C) (Oral)   Ht '5\' 5"'$  (1.651 m)   Wt 168 lb (76.2 kg)   BMI 27.96 kg/m         Assessment & Plan:  1. Hyperlipidemia Lipids were at goal at last check 6 months ago. I think we can wait 6 more months before repeating. She does complain of some tiredness and I have suggested she could hold the lipids for 2 weeks to see if she can see if that might be causative.  2. CKD (chronic kidney disease), unspecified stage Renal function was stable at  last check. We'll recheck in 6 months  Wardell Honour MD

## 2016-01-06 NOTE — Patient Instructions (Signed)
Continue current medications. Continue good therapeutic lifestyle changes which include good diet and exercise. Fall precautions discussed with patient. If an FOBT was given today- please return it to our front desk. If you are over 63 years old - you may need Prevnar 13 or the adult Pneumonia vaccine.  **Flu shots are available--- please call and schedule a FLU-CLINIC appointment**  After your visit with us today you will receive a survey in the mail or online from [employer] regarding your care with us. Please take a moment to fill this out. Your feedback is very important to us as you can help us better understand your patient needs as well as improve your experience and satisfaction. WE CARE ABOUT YOU!!!    

## 2016-01-09 ENCOUNTER — Other Ambulatory Visit: Payer: Self-pay | Admitting: Family Medicine

## 2016-01-24 ENCOUNTER — Emergency Department (HOSPITAL_COMMUNITY)
Admission: EM | Admit: 2016-01-24 | Discharge: 2016-01-24 | Disposition: A | Discharge status: Home / Self Care | Payer: Medicare Other | Attending: Emergency Medicine | Admitting: Emergency Medicine

## 2016-01-24 ENCOUNTER — Encounter (HOSPITAL_COMMUNITY): Payer: Self-pay | Admitting: Emergency Medicine

## 2016-01-24 ENCOUNTER — Emergency Department (HOSPITAL_COMMUNITY): Payer: Medicare Other

## 2016-01-24 DIAGNOSIS — Z87891 Personal history of nicotine dependence: Secondary | ICD-10-CM | POA: Diagnosis not present

## 2016-01-24 DIAGNOSIS — Z79899 Other long term (current) drug therapy: Secondary | ICD-10-CM | POA: Insufficient documentation

## 2016-01-24 DIAGNOSIS — S42201A Unspecified fracture of upper end of right humerus, initial encounter for closed fracture: Secondary | ICD-10-CM | POA: Diagnosis not present

## 2016-01-24 DIAGNOSIS — Y929 Unspecified place or not applicable: Secondary | ICD-10-CM | POA: Diagnosis not present

## 2016-01-24 DIAGNOSIS — S42214A Unspecified nondisplaced fracture of surgical neck of right humerus, initial encounter for closed fracture: Secondary | ICD-10-CM | POA: Diagnosis not present

## 2016-01-24 DIAGNOSIS — W108XXA Fall (on) (from) other stairs and steps, initial encounter: Secondary | ICD-10-CM | POA: Insufficient documentation

## 2016-01-24 DIAGNOSIS — Y939 Activity, unspecified: Secondary | ICD-10-CM | POA: Diagnosis not present

## 2016-01-24 DIAGNOSIS — S8991XA Unspecified injury of right lower leg, initial encounter: Secondary | ICD-10-CM | POA: Diagnosis not present

## 2016-01-24 DIAGNOSIS — Z7982 Long term (current) use of aspirin: Secondary | ICD-10-CM | POA: Insufficient documentation

## 2016-01-24 DIAGNOSIS — Y999 Unspecified external cause status: Secondary | ICD-10-CM | POA: Diagnosis not present

## 2016-01-24 DIAGNOSIS — S4991XA Unspecified injury of right shoulder and upper arm, initial encounter: Secondary | ICD-10-CM | POA: Diagnosis present

## 2016-01-24 MED ORDER — ACETAMINOPHEN 500 MG PO TABS
1000.0000 mg | ORAL_TABLET | Freq: Once | ORAL | Status: AC
  Administered 2016-01-24: 1000 mg via ORAL
  Filled 2016-01-24: qty 2

## 2016-01-24 MED ORDER — OXYCODONE HCL 5 MG PO TABS: 5.0000 mg | ORAL_TABLET | ORAL | 0 refills | Status: DC | PRN

## 2016-01-24 MED ORDER — IBUPROFEN 800 MG PO TABS
800.0000 mg | ORAL_TABLET | Freq: Once | ORAL | Status: AC
  Administered 2016-01-24: 800 mg via ORAL
  Filled 2016-01-24: qty 1

## 2016-01-24 MED ORDER — OXYCODONE HCL 5 MG PO TABS
2.5000 mg | ORAL_TABLET | Freq: Once | ORAL | Status: AC
  Administered 2016-01-24: 2.5 mg via ORAL
  Filled 2016-01-24: qty 1

## 2016-01-24 NOTE — ED Triage Notes (Signed)
Pt states she fell of a step at the [employer] in [place]. Pt c/o pain to her R arm and knee.

## 2016-01-24 NOTE — Discharge Instructions (Signed)

## 2016-01-24 NOTE — ED Provider Notes (Signed)
[facility] DEPT Provider Note   CSN: [id number] Arrival date & time: 01/24/16  1602     History   Chief Complaint Chief Complaint  Patient presents with  . Fall    HPI Jamie [name] is a 65 y.o. female.  65 yo F with a chief complaint of a fall. Patient missed a step while she was leaving [employer] landed on her right side. Complaining of pain mostly to the right proximal humerus. Feels that she's having trouble lifting up her arm without significant pain. Has not tried anything for this. Denies radiation. Pain feels like a burning. Denies head injury denies loss consciousness. Patient having some mild right knee pain as well.   The history is provided by the patient.  Fall  This is a new problem. The current episode started less than 1 hour ago. The problem occurs constantly. The problem has not changed since onset.Pertinent negatives include no chest pain, no headaches and no shortness of breath. Nothing aggravates the symptoms. Nothing relieves the symptoms. She has tried nothing for the symptoms. The treatment provided no relief.    Past Medical History:  Diagnosis Date  . Brain cancer ([place])   . Cancer ([place])   . CKD (chronic kidney disease), stage III   . High cholesterol   . Insomnia   . Lung cancer ([place])   . Osteoporosis     Patient Active Problem List   Diagnosis Date Noted  . Osteoporosis 10/09/2014  . At high risk for falls 10/09/2014  . Hx of cancer of lung 08/01/2014  . Hx of brain cancer 08/01/2014  . Personal history of colonic polyps - adenomas 07/18/2013  . CKD (chronic kidney disease) stage 3, GFR 30-59 ml/min 07/27/2012  . Syncope 03/22/2011  . Hypotension 03/22/2011  . Memory impairment 03/22/2011  . History of cancer 03/22/2011  . Hyperlipidemia 03/22/2011    Past Surgical History:  Procedure Laterality Date  . ABDOMINAL HYSTERECTOMY  1986  . BRAIN SURGERY  2001   TUMOR REMOVAL  . catarac Bilateral   . EYE SURGERY    . LOBECTOMY       OB History    Gravida Para Term Preterm AB Living   '2 2 2         '$ SAB TAB Ectopic Multiple Live Births                   Home Medications    Prior to Admission medications   Medication Sig Start Date End Date Taking? Authorizing Provider  aspirin EC 81 MG tablet Take 81 mg by mouth daily.      Historical Provider, MD  diazepam (VALIUM) 5 MG tablet TAKE 1 TABLET AT BEDTIME AS NEEDED FOR ANXIETY 01/09/16   Wardell Honour, MD  Multiple Vitamins-Minerals (ALIVE WOMENS ENERGY PO) Take 1 tablet by mouth daily.    Historical Provider, MD  oxyCODONE (ROXICODONE) 5 MG immediate release tablet Take 1 tablet (5 mg total) by mouth every 4 (four) hours as needed for severe pain. 01/24/16   Deno Etienne, DO  rosuvastatin (CRESTOR) 10 MG tablet TAKE 1 TABLET AT BEDTIME 08/20/15   Wardell Honour, MD  valACYclovir (VALTREX) 1000 MG tablet Take 2 tabs at onset, then 2 tabs 1 hour later 07/03/15   Wardell Honour, MD    Family History Family History  Problem Relation Age of Onset  . Cancer Mother     cirrhois of liver  . Heart attack Father   .  Colon cancer Father   . Asthma Brother     Social History Social History  Substance Use Topics  . Smoking status: Former Smoker    Years: 33.00    Types: Cigarettes    Quit date: 04/20/1999  . Smokeless tobacco: Never Used  . Alcohol use 1.8 oz/week    3 Cans of beer per week     Comment: occassional Saturday night 3 dark beer.     Allergies   Fosamax [alendronate sodium] and Sulfonamide derivatives   Review of Systems Review of Systems  Constitutional: Negative for chills and fever.  HENT: Negative for congestion and rhinorrhea.   Eyes: Negative for redness and visual disturbance.  Respiratory: Negative for shortness of breath and wheezing.   Cardiovascular: Negative for chest pain and palpitations.  Gastrointestinal: Negative for nausea and vomiting.  Genitourinary: Negative for dysuria and urgency.  Musculoskeletal: Positive for  arthralgias and myalgias.  Skin: Negative for pallor and wound.  Neurological: Negative for dizziness and headaches.     Physical Exam Updated Vital Signs BP 142/85   Pulse 75   Temp 98 F (36.7 C) (Oral)   Resp 18   Ht '5\' 6"'$  (1.676 m)   Wt 160 lb (72.6 kg)   SpO2 97%   BMI 25.82 kg/m   Physical Exam  Constitutional: She is oriented to person, place, and time. She appears well-developed and well-nourished. No distress.  HENT:  Head: Normocephalic and atraumatic.  Eyes: EOM are normal. Pupils are equal, round, and reactive to light.  Neck: Normal range of motion. Neck supple.  Cardiovascular: Normal rate and regular rhythm.  Exam reveals no gallop and no friction rub.   No murmur heard. Pulmonary/Chest: Effort normal. She has no wheezes. She has no rales.  Abdominal: Soft. She exhibits no distension and no mass. There is no tenderness. There is no guarding.  Musculoskeletal: She exhibits tenderness. She exhibits no edema.  Mild tenderness to the proximal humerus as well as the right knee. Patient has a small abrasion over the right kneecap. Pulse motor and sensation is intact to the right upper and right lower extremity. Limited exam due to pain with range of motion of shoulder.  Neurological: She is alert and oriented to person, place, and time.  Skin: Skin is warm and dry. She is not diaphoretic.  Psychiatric: She has a normal mood and affect. Her behavior is normal.  Nursing note and vitals reviewed.    ED Treatments / Results  Labs (all labs ordered are listed, but only abnormal results are displayed) Labs Reviewed - No data to display  EKG  EKG Interpretation None       Radiology Dg Shoulder Right  Result Date: 01/24/2016 CLINICAL DATA:  [place] off curb at [facility] today, most pain right shoulder, scraped right anterior knee EXAM: RIGHT SHOULDER - 2+ VIEW COMPARISON:  None. FINDINGS: There is a comminuted fracture of the proximal right humerus. There is an  oblique fracture across metaphysis with a secondary fracture component from the greater tuberosity. There is no significant fracture displacement, angulation or foreshortening. The glenohumeral and AC joints are normally spaced and aligned. Bones are demineralized. There is surrounding soft tissue swelling. IMPRESSION: Fracture of the proximal right humerus as described without significant displacement or angulation. No dislocation. Electronically Signed   By: Lajean Manes M.D.   On: 01/24/2016 17:21   Dg Knee Complete 4 Views Right  Result Date: 01/24/2016 CLINICAL DATA:  [address] today and injured right knee. EXAM:  RIGHT KNEE - COMPLETE 4+ VIEW COMPARISON:  None. FINDINGS: The joint spaces are maintained. No acute fracture or osteochondral lesion. No joint effusion. IMPRESSION: No acute fracture or joint effusion. Electronically Signed   By: Marijo Sanes M.D.   On: 01/24/2016 17:25   Dg Humerus Right  Result Date: 01/24/2016 CLINICAL DATA:  [address] off curb today at [facility] and injured right shoulder. EXAM: RIGHT HUMERUS - 2+ VIEW COMPARISON:  None. FINDINGS: Humeral head and neck fractures are noted. CT may be helpful for further evaluation. The [facility] joint is intact. No glenohumeral dislocation. The scapula and right ribs appear intact. IMPRESSION: Comminuted humeral head and neck fractures. CT suggested for further evaluation. Electronically Signed   By: Marijo Sanes M.D.   On: 01/24/2016 17:24    Procedures Procedures (including critical care time)  Medications Ordered in ED Medications  acetaminophen (TYLENOL) tablet 1,000 mg (1,000 mg Oral Given 01/24/16 1705)  ibuprofen (ADVIL,MOTRIN) tablet 800 mg (800 mg Oral Given 01/24/16 1705)  oxyCODONE (Oxy IR/ROXICODONE) immediate release tablet 2.5 mg (2.5 mg Oral Given 01/24/16 1705)     Initial Impression / Assessment and Plan / ED Course  I have reviewed the triage vital signs and the nursing notes.  Pertinent labs & imaging results that were  available during my care of the patient were reviewed by me and considered in my medical decision making (see chart for details).  Clinical Course    65 yo F With a chief complaint of a fall. Patient tripped over a step. Complaining of right shoulder and right knee pain. Will x-ray.  Xray with proximal humerus fx. Place in sling.  D/c home.   6:09 PM:  I have discussed the diagnosis/risks/treatment options with the patient and family and believe the pt to be eligible for discharge home to follow-up with PCP. We also discussed returning to the ED immediately if new or worsening sx occur. We discussed the sx which are most concerning (e.g., sudden worsening pain, fever, inability to tolerate by mouth) that necessitate immediate return. Medications administered to the patient during their visit and any new prescriptions provided to the patient are listed below.  Medications given during this visit Medications  acetaminophen (TYLENOL) tablet 1,000 mg (1,000 mg Oral Given 01/24/16 1705)  ibuprofen (ADVIL,MOTRIN) tablet 800 mg (800 mg Oral Given 01/24/16 1705)  oxyCODONE (Oxy IR/ROXICODONE) immediate release tablet 2.5 mg (2.5 mg Oral Given 01/24/16 1705)     The patient appears reasonably screen and/or stabilized for discharge and I doubt any other medical condition or other [facility] requiring further screening, evaluation, or treatment in the ED at this time prior to discharge.    Final Clinical Impressions(s) / ED Diagnoses   Final diagnoses:  Closed nondisplaced fracture of surgical neck of right humerus, unspecified fracture morphology, initial encounter    New Prescriptions New Prescriptions   OXYCODONE (ROXICODONE) 5 MG IMMEDIATE RELEASE TABLET    Take 1 tablet (5 mg total) by mouth every 4 (four) hours as needed for severe pain.     Deno Etienne, DO 01/24/16 Dionicia Abler

## 2016-01-30 ENCOUNTER — Ambulatory Visit: Payer: Medicare Other | Admitting: Family Medicine

## 2016-02-04 ENCOUNTER — Ambulatory Visit (INDEPENDENT_AMBULATORY_CARE_PROVIDER_SITE_OTHER): Payer: Medicare Other | Admitting: Physician Assistant

## 2016-02-04 DIAGNOSIS — S42201A Unspecified fracture of upper end of right humerus, initial encounter for closed fracture: Secondary | ICD-10-CM | POA: Diagnosis not present

## 2016-02-12 ENCOUNTER — Encounter: Payer: Self-pay | Admitting: Family Medicine

## 2016-02-12 ENCOUNTER — Telehealth (INDEPENDENT_AMBULATORY_CARE_PROVIDER_SITE_OTHER): Payer: Self-pay | Admitting: *Deleted

## 2016-02-12 ENCOUNTER — Ambulatory Visit (INDEPENDENT_AMBULATORY_CARE_PROVIDER_SITE_OTHER): Payer: Medicare Other | Admitting: Family Medicine

## 2016-02-12 VITALS — BP 133/84 | HR 77 | Temp 98.0°F | Ht 66.0 in | Wt 168.0 lb

## 2016-02-12 DIAGNOSIS — R2681 Unsteadiness on feet: Secondary | ICD-10-CM | POA: Diagnosis not present

## 2016-02-12 DIAGNOSIS — R531 Weakness: Secondary | ICD-10-CM | POA: Diagnosis not present

## 2016-02-12 DIAGNOSIS — R296 Repeated falls: Secondary | ICD-10-CM | POA: Diagnosis not present

## 2016-02-12 MED ORDER — OXYCODONE-ACETAMINOPHEN 5-325 MG PO TABS: 1.0000 | ORAL_TABLET | Freq: Four times a day (QID) | ORAL | 0 refills | Status: DC | PRN

## 2016-02-12 NOTE — Telephone Encounter (Signed)
Please advise 

## 2016-02-12 NOTE — Telephone Encounter (Signed)
Son [name] will pick up

## 2016-02-12 NOTE — Progress Notes (Signed)
Subjective:    Patient ID: Jamie [name], female    DOB: Aug 17, 1950, 65 y.o.   MRN: [id number]  HPI Patient here today for hospital follow up from [facility] and orthopedic. She has had several fall and was seen at [facility] for right arm injury from a fall. She is accompanied today by a family friend.  Fall resulted in comminuted fracture of the right humerus. She has seen an orthopedist. Treatment at this time consists only of an arm sling. Pain has been significant. She has been on hydrocodone but is currently out of prescription and asked her to call orthopedist for a refill before her next visit in one week. She has a history of falls. Her vision is okay. The falls seem to be related to some balance and weakness. She does have a history of brain tumor and I suspect this figures into her gait instability    Patient Active Problem List   Diagnosis Date Noted  . Osteoporosis 10/09/2014  . At high risk for falls 10/09/2014  . Hx of cancer of lung 08/01/2014  . Hx of brain cancer 08/01/2014  . Personal history of colonic polyps - adenomas 07/18/2013  . CKD (chronic kidney disease) stage 3, GFR 30-59 ml/min 07/27/2012  . Syncope 03/22/2011  . Hypotension 03/22/2011  . Memory impairment 03/22/2011  . History of cancer 03/22/2011  . Hyperlipidemia 03/22/2011   Outpatient Encounter Prescriptions as of 02/12/2016  Medication Sig  . aspirin EC 81 MG tablet Take 81 mg by mouth daily.    . diazepam (VALIUM) 5 MG tablet TAKE 1 TABLET AT BEDTIME AS NEEDED FOR ANXIETY  . folic acid (FOLVITE) 1 MG tablet Take 1 mg by mouth daily.  . Multiple Vitamins-Minerals (ALIVE WOMENS ENERGY PO) Take 1 tablet by mouth daily.  . rosuvastatin (CRESTOR) 10 MG tablet TAKE 1 TABLET AT BEDTIME  . valACYclovir (VALTREX) 1000 MG tablet Take 2 tabs at onset, then 2 tabs 1 hour later  . HYDROcodone-acetaminophen (NORCO/VICODIN) 5-325 MG tablet   . [DISCONTINUED] oxyCODONE (ROXICODONE) 5 MG immediate release  tablet Take 1 tablet (5 mg total) by mouth every 4 (four) hours as needed for severe pain.   No facility-administered encounter medications on file as of 02/12/2016.       Review of Systems  Constitutional: Negative.   HENT: Negative.   Eyes: Negative.   Respiratory: Negative.   Cardiovascular: Negative.   Gastrointestinal: Negative.   Endocrine: Negative.   Genitourinary: Negative.   Musculoskeletal: Positive for arthralgias (right arm - seen ortho).  Skin: Negative.   Allergic/Immunologic: Negative.   Neurological: Negative.   Hematological: Negative.   Psychiatric/Behavioral: Negative.        Objective:   Physical Exam  Constitutional: She is oriented to person, place, and time. She appears well-developed and well-nourished.  Cardiovascular: Normal rate.   Musculoskeletal:  Walk with patient in the hall. Her gait is broad base and steps are very cautious. Her balance seems to be okay as I had her stand in applied pressure from either side followed and backwards. I asked her then to reach up over her head and then reached down and pick up a object from the floor. She did this without loss of balance.  Neurological: She is alert and oriented to person, place, and time.   BP 133/84 (BP Location: Left Arm)   Pulse 77   Temp 98 F (36.7 C) (Oral)   Ht '5\' 6"'$  (1.676 m)   Wt 168  lb (76.2 kg)   BMI 27.12 kg/m         Assessment & Plan:  1. Gait instability Chronic but worsening with more frequent falls of late - Ambulatory referral to Physical Therapy  2. General weakness She tends to sit in a chair all day watching television. Encouraged movement and walking. She no longer walks to the mailbox to retrieve her mail. This is due to her fear of falling - Ambulatory referral to Physical Therapy  3. Frequent falls  - Ambulatory referral to Physical Therapy  Wardell Honour MD

## 2016-02-12 NOTE — Telephone Encounter (Signed)
Can come and pick up percocet script

## 2016-02-12 NOTE — Telephone Encounter (Signed)
Pt. Called and requested something for pain. Pt had prescription for hydrocodone but it doesn't seem to help. Pt. Callback number is [phone].

## 2016-02-13 ENCOUNTER — Ambulatory Visit: Payer: Medicare Other | Admitting: Family Medicine

## 2016-02-17 ENCOUNTER — Ambulatory Visit: Payer: Medicare (Managed Care) | Admitting: Physical Therapy

## 2016-02-18 ENCOUNTER — Ambulatory Visit (INDEPENDENT_AMBULATORY_CARE_PROVIDER_SITE_OTHER): Payer: Medicare Other | Admitting: Physician Assistant

## 2016-02-18 ENCOUNTER — Ambulatory Visit (INDEPENDENT_AMBULATORY_CARE_PROVIDER_SITE_OTHER): Payer: Medicare Other

## 2016-02-18 DIAGNOSIS — S42294D Other nondisplaced fracture of upper end of right humerus, subsequent encounter for fracture with routine healing: Secondary | ICD-10-CM | POA: Diagnosis not present

## 2016-02-18 NOTE — Progress Notes (Signed)
Office Visit Note   Patient: Jamie [name]           Date of Birth: June 29, 1950           MRN: [id number] Visit Date: 02/18/2016              Requested by: Wardell Honour, MD [place], [place] [address] PCP: Wardell Honour, MD   Assessment & Plan: Visit Diagnoses:  1. Other closed nondisplaced fracture of proximal end of right humerus with routine healing, subsequent encounter     Plan: Continue arm sling right shoulder. Gentle range of motion exercises of the elbow with supination and pronation forearm. Squeeze while recommended  Follow-Up Instructions: Return in about 3 weeks (around 03/10/2016) for Radiographs.   Orders:  Orders Placed This Encounter  Procedures  . XR Shoulder Right   No orders of the defined types were placed in this encounter.     Procedures: No procedures performed   Clinical Data: No additional findings.   Subjective: Chief Complaint  Patient presents with  . Right Shoulder - Fracture, Follow-up    Patient here today to followup right proximal humerus fracture.She is wearing a sling. States she's in quite a bit of pain. States she hasn't tried much range of motion. Having swelling in hand.     Review of Systems   Objective: Vital Signs: There were no vitals taken for this visit.  Physical Exam  Constitutional: She appears well-developed and well-nourished.  Skin: Skin is warm and dry.    Ortho Exam  Right shoulder arm sling intact. Good  range of motion of the right elbow without pain. Supination pronation of forearm intact. Radial pulse 2+ full motor sensation of the right hand.  Specialty Comments:  No specialty comments available.  Imaging: Xr Shoulder Right  Result Date: 02/18/2016 2 views of the right shoulder shows the humerus well located. Early consolidation of the humeral neck and head comminuted fracture. No change in the overall position alignment from previous films. Other fractures.    PMFS  History: Patient Active Problem List   Diagnosis Date Noted  . Osteoporosis 10/09/2014  . At high risk for falls 10/09/2014  . Hx of cancer of lung 08/01/2014  . Hx of brain cancer 08/01/2014  . Personal history of colonic polyps - adenomas 07/18/2013  . CKD (chronic kidney disease) stage 3, GFR 30-59 ml/min 07/27/2012  . Syncope 03/22/2011  . Hypotension 03/22/2011  . Memory impairment 03/22/2011  . History of cancer 03/22/2011  . Hyperlipidemia 03/22/2011   Past Medical History:  Diagnosis Date  . Brain cancer ([place])   . Cancer ([place])   . CKD (chronic kidney disease), stage III   . High cholesterol   . Insomnia   . Lung cancer ([place])   . Osteoporosis     Family History  Problem Relation Age of Onset  . Cancer Mother     cirrhois of liver  . Heart attack Father   . Colon cancer Father   . Asthma Brother     Past Surgical History:  Procedure Laterality Date  . ABDOMINAL HYSTERECTOMY  1986  . BRAIN SURGERY  2001   TUMOR REMOVAL  . catarac Bilateral   . EYE SURGERY    . LOBECTOMY     Social History   Occupational History  . Not on file.   Social History Main Topics  . Smoking status: Former Smoker    Years: 33.00    Types:  Cigarettes    Quit date: 04/20/1999  . Smokeless tobacco: Never Used  . Alcohol use 1.8 oz/week    3 Cans of beer per week     Comment: occassional Saturday night 3 dark beer.  . Drug use: No  . Sexual activity: No

## 2016-02-27 ENCOUNTER — Telehealth: Payer: Self-pay | Admitting: Family Medicine

## 2016-02-27 MED ORDER — HYDROCODONE-ACETAMINOPHEN 5-325 MG PO TABS: 1.0000 | ORAL_TABLET | Freq: Four times a day (QID) | ORAL | 0 refills | Status: DC | PRN

## 2016-02-27 NOTE — Telephone Encounter (Signed)
Pt aware that rx ready

## 2016-02-27 NOTE — Telephone Encounter (Signed)
Not sure why orthopedist will not give her something for pain but I did tell her I would do it if orthopedist declined so if we can give her hydrocodone 5 mg take 1 every 6 as needed for pain #20 with no refills

## 2016-03-01 ENCOUNTER — Other Ambulatory Visit: Payer: Self-pay | Admitting: Family Medicine

## 2016-03-02 NOTE — Telephone Encounter (Signed)
Refill called to [facility] 

## 2016-03-15 ENCOUNTER — Ambulatory Visit (INDEPENDENT_AMBULATORY_CARE_PROVIDER_SITE_OTHER): Payer: Medicare Other | Admitting: Physician Assistant

## 2016-03-15 ENCOUNTER — Other Ambulatory Visit: Payer: Self-pay | Admitting: Family Medicine

## 2016-03-15 ENCOUNTER — Other Ambulatory Visit (INDEPENDENT_AMBULATORY_CARE_PROVIDER_SITE_OTHER): Payer: Self-pay | Admitting: Orthopaedic Surgery

## 2016-03-16 ENCOUNTER — Encounter: Payer: Self-pay | Admitting: Family Medicine

## 2016-03-16 MED ORDER — HYDROCODONE-ACETAMINOPHEN 5-325 MG PO TABS: 1.0000 | ORAL_TABLET | Freq: Four times a day (QID) | ORAL | 0 refills | Status: DC | PRN

## 2016-03-16 NOTE — Telephone Encounter (Signed)
Patient notified that prescription is ready for pick up.  Jamelle Haring to place at front.

## 2016-03-17 ENCOUNTER — Other Ambulatory Visit (INDEPENDENT_AMBULATORY_CARE_PROVIDER_SITE_OTHER): Payer: Self-pay | Admitting: Orthopaedic Surgery

## 2016-03-17 MED ORDER — OXYCODONE-ACETAMINOPHEN 5-325 MG PO TABS: 1.0000 | ORAL_TABLET | Freq: Three times a day (TID) | ORAL | 0 refills | Status: DC | PRN

## 2016-03-29 ENCOUNTER — Ambulatory Visit (INDEPENDENT_AMBULATORY_CARE_PROVIDER_SITE_OTHER): Payer: Medicare Other | Admitting: Physician Assistant

## 2016-03-30 ENCOUNTER — Other Ambulatory Visit (INDEPENDENT_AMBULATORY_CARE_PROVIDER_SITE_OTHER): Payer: Self-pay

## 2016-03-30 ENCOUNTER — Telehealth (INDEPENDENT_AMBULATORY_CARE_PROVIDER_SITE_OTHER): Payer: Self-pay | Admitting: Orthopaedic Surgery

## 2016-03-30 MED ORDER — OXYCODONE-ACETAMINOPHEN 5-325 MG PO TABS: ORAL_TABLET | ORAL | 0 refills | Status: DC

## 2016-03-30 NOTE — Telephone Encounter (Signed)
Can you advise since Ninfa Linden in surgery this afternoon?

## 2016-03-30 NOTE — Telephone Encounter (Signed)
Spoke with patient informed her Rx was ready for pickup @ front desk

## 2016-03-30 NOTE — Telephone Encounter (Signed)
Call her and tell her this is ready at front desk please

## 2016-03-30 NOTE — Progress Notes (Signed)
percoce

## 2016-03-30 NOTE — Telephone Encounter (Signed)
#  20 of percocet. 1 bid prn

## 2016-03-31 ENCOUNTER — Ambulatory Visit (INDEPENDENT_AMBULATORY_CARE_PROVIDER_SITE_OTHER): Payer: Medicare Other | Admitting: Physician Assistant

## 2016-04-01 ENCOUNTER — Ambulatory Visit (INDEPENDENT_AMBULATORY_CARE_PROVIDER_SITE_OTHER): Payer: Medicare Other | Admitting: Physician Assistant

## 2016-04-08 ENCOUNTER — Ambulatory Visit (INDEPENDENT_AMBULATORY_CARE_PROVIDER_SITE_OTHER): Payer: Medicare Other

## 2016-04-08 ENCOUNTER — Ambulatory Visit (INDEPENDENT_AMBULATORY_CARE_PROVIDER_SITE_OTHER): Payer: Medicare Other | Admitting: Physician Assistant

## 2016-04-08 DIAGNOSIS — S42294D Other nondisplaced fracture of upper end of right humerus, subsequent encounter for fracture with routine healing: Secondary | ICD-10-CM | POA: Diagnosis not present

## 2016-04-08 NOTE — Progress Notes (Signed)
Office Visit Note   Patient: Jamie [name]           Date of Birth: May 30, 1950           MRN: [id number] Visit Date: 04/08/2016              Requested by: Wardell Honour, MD [place], [place] [address] PCP: Wardell Honour, MD   Assessment & Plan: Visit Diagnoses:  1. Traumatic closed nondisplaced fracture of anatomical neck of humerus with routine healing, right     Plan: We will send her to physical therapy for range of motion strengthening of the right shoulder also for home pulley system. I discussed with her range of motion exercises she can perform on her own before she gets to therapy. Weeks weeks' check progress lack of no x-rays at that time was clinically indicated  Follow-Up Instructions: Return in about 6 weeks (around 05/20/2016).   Orders:  Orders Placed This Encounter  Procedures  . XR Shoulder Right   No orders of the defined types were placed in this encounter.     Procedures: No procedures performed   Clinical Data: No additional findings.   Subjective: Chief Complaint  Patient presents with  . Right Shoulder - Follow-up    Patient here today following up her right arm. She states she is still in quite a bit of pain. She is somewhat reluctant to move the right arm area    Review of Systems   Objective: Vital Signs: There were no vitals taken for this visit.  Physical Exam  Ortho Exam Right shoulder passive range of motion 90, external rotation very limited. Right hand sensation intact, medial pulses intact. Specialty Comments:  No specialty comments available.  Imaging: Xr Shoulder Right  Result Date: 04/08/2016 3 Views right shoulder: Humeral head is located. Good consolidation of the proximal humerus fracture.No Other fractures seen.    PMFS History: Patient Active Problem List   Diagnosis Date Noted  . Osteoporosis 10/09/2014  . At high risk for falls 10/09/2014  . Hx of cancer of lung 08/01/2014  . Hx of  brain cancer 08/01/2014  . Personal history of colonic polyps - adenomas 07/18/2013  . CKD (chronic kidney disease) stage 3, GFR 30-59 ml/min 07/27/2012  . Syncope 03/22/2011  . Hypotension 03/22/2011  . Memory impairment 03/22/2011  . History of cancer 03/22/2011  . Hyperlipidemia 03/22/2011   Past Medical History:  Diagnosis Date  . Brain cancer ([place])   . Cancer ([place])   . CKD (chronic kidney disease), stage III   . High cholesterol   . Insomnia   . Lung cancer ([place])   . Osteoporosis     Family History  Problem Relation Age of Onset  . Cancer Mother     cirrhois of liver  . Heart attack Father   . Colon cancer Father   . Asthma Brother     Past Surgical History:  Procedure Laterality Date  . ABDOMINAL HYSTERECTOMY  1986  . BRAIN SURGERY  2001   TUMOR REMOVAL  . catarac Bilateral   . EYE SURGERY    . LOBECTOMY     Social History   Occupational History  . Not on file.   Social History Main Topics  . Smoking status: Former Smoker    Years: 33.00    Types: Cigarettes    Quit date: 04/20/1999  . Smokeless tobacco: Never Used  . Alcohol use 1.8 oz/week    3  Cans of beer per week     Comment: occassional Saturday night 3 dark beer.  . Drug use: No  . Sexual activity: No

## 2016-04-30 ENCOUNTER — Ambulatory Visit: Payer: Medicare Other | Attending: Orthopaedic Surgery | Admitting: Physical Therapy

## 2016-04-30 DIAGNOSIS — M25611 Stiffness of right shoulder, not elsewhere classified: Secondary | ICD-10-CM

## 2016-04-30 DIAGNOSIS — M25511 Pain in right shoulder: Secondary | ICD-10-CM | POA: Diagnosis present

## 2016-04-30 NOTE — Therapy (Addendum)
[facility] [place], [place], [address] Phone: [phone]   Fax:  [phone]  Physical Therapy Evaluation  Patient Details  Name: Jamie [name] MRN: [id number] Date of Birth: [birth year] Referring Provider: Jean Rosenthal MD  Encounter Date: 04/30/2016      PT End of Session - 04/30/16 1327    Visit Number 1   Number of Visits 12   Date for PT Re-Evaluation 06/29/16   PT Start Time 0908   PT Stop Time 0955   PT Time Calculation (min) 47 min   Activity Tolerance Patient tolerated treatment well   Behavior During Therapy [facility] for tasks assessed/performed      Past Medical History:  Diagnosis Date  . Brain cancer ([place])   . Cancer ([place])   . CKD (chronic kidney disease), stage III   . High cholesterol   . Insomnia   . Lung cancer ([place])   . Osteoporosis     Past Surgical History:  Procedure Laterality Date  . ABDOMINAL HYSTERECTOMY  1986  . BRAIN SURGERY  2001   TUMOR REMOVAL  . catarac Bilateral   . EYE SURGERY    . LOBECTOMY      There were no vitals filed for this visit.       Subjective Assessment - 04/30/16 1319    Subjective The patient presents to OPPT with a close friend whom was providing HHA for ambulation.  Patient states she has a walker but has not been using it.  She reportedly has had several falls.  She was strongly encouraged to use her walker.  She fell on 01/24/16 and sustained a right proximal humeral fracture.  She is now out of her sling but holds her right arm next to her body in a very protective fashion.  She has been instructed in the pendulum exercise.  The patient reports 10/10 pain with movement but very low pain at rest.   Patient is accompained by: --  Friend.   Pertinent History Brain cancer.     Patient Stated Goals Regain use of my right arm without pain.   Currently in Pain? Yes   Pain Score 10-Worst pain ever   Pain Location Shoulder   Pain Orientation Right   Pain Descriptors / Indicators Aching;Sharp   Pain Type Acute pain   Pain Onset More than a month ago   Pain Frequency Constant   Aggravating Factors  See above.   Pain Relieving Factors See above.   Effect of Pain on Daily Activities Cannot perform ADL's using right UE.            [facility] PT Assessment - 04/30/16 0001      Assessment   Medical Diagnosis Right proximal humeral fracture.   Referring Provider Jean Rosenthal MD   Onset Date/Surgical Date --  01/24/16.     Precautions   Precautions Fall   Precaution Comments Patient needs constant supervision as she is at high risk for falls.  Begin with gentle right shoulder PAROM/PROM.     Restrictions   Weight Bearing Restrictions No     Balance Screen   Has the patient fallen in the past 6 months Yes   Has the patient had a decrease in activity level because of a fear of falling?  Yes   Is the patient reluctant to leave their home because of a fear of falling?  Yes     [place] residence  Prior Function   Level of Independence Independent     Cognition   Memory --  Memory loss since brain cancer reported.     Posture/Postural Control   Posture Comments Guarded posture with right arm held against side.     ROM / Strength   AROM / PROM / Strength PROM     PROM   Overall PROM Comments In supine passive right shoulder flexion= 38 degrees; IR to abdomen and ER= -10 degrees.     Palpation   Palpation comment C/o diffuse right shoulder pain with referred pain into right UE.     Ambulation/Gait   Gait Comments Gait ataxia with HHA required for safety.                   [facility] Adult PT Treatment/Exercise - 04/30/16 0001      Modalities   Modalities Electrical Stimulation;Moist Heat     Moist Heat Therapy   Number Minutes Moist Heat 20 Minutes   Moist Heat Location --  RT SHLD.     Acupuncturist Location --  RT SHLD.    Electrical Stimulation Action IFC at 80-150 Hz at 100% scan x 20 minutes.   Electrical Stimulation Goals Pain                PT Education - 04/30/16 1326    Education provided Yes   Education Details See above.   Person(s) Educated Patient;Caregiver(s)   Methods Explanation;Demonstration;Tactile cues;Verbal cues;Handout   Comprehension Verbalized understanding;Returned demonstration;Verbal cues required;Tactile cues required;Need further instruction          PT Short Term Goals - 04/30/16 1333      PT SHORT TERM GOAL #1   Title STG's=LTG's.           PT Long Term Goals - 04/30/16 1333      PT LONG TERM GOAL #1   Title Independent with a HEP.   Time 8   Period Weeks   Status New     PT LONG TERM GOAL #2   Title Active right shoulder ER to 70 degrees+ to allow for easily donning/doffing of apparel   Time 8   Period Weeks   Status New     PT LONG TERM GOAL #3   Title Active shoulder flexion to 135 degrees so the patient can easily reach overhead   Time 8   Period Weeks   Status New     PT LONG TERM GOAL #4   Title Increase right shoulder strength to a solid 4/5 to increase stability for performance of functional activities   Time 8   Period Weeks   Status New     PT LONG TERM GOAL #5   Title Perform ADL's with right UE with pain not > 3/10.   Time 8   Period Weeks   Status New               Plan - 04/30/16 1329    Clinical Impression Statement The patient presenst with a very notable loss of right shoulder range of motion.  She is very guarded currently and has severe pain complaints with with gentle passive range of motion.  She is unable to perform ADL's using her right UE.  Patient reports soem memory loss since brain cancer and will need further instruction in home exercise.  She is also at risk for falls and needs to be using her walker which she admits to not  be using.   Rehab Potential Good   PT Frequency 2x / week   PT Duration 8  weeks   PT Treatment/Interventions ADLs/Self Care Home Management;Cryotherapy;Electrical Stimulation;Moist Heat;Therapeutic activities;Therapeutic exercise;Neuromuscular re-education;Patient/family education;Passive range of motion;Manual techniques;Dry needling   PT Next Visit Plan PROM.Marland KitchenPAROM to patient's right shoulder.  Instruct in HEP to improve range of motion.  HMP and electrical stimulation.   Consulted and Agree with Plan of Care Patient      Patient will benefit from skilled therapeutic intervention in order to improve the following deficits and impairments:  Decreased range of motion, Pain, Decreased activity tolerance  Visit Diagnosis: Acute pain of right shoulder - Plan: PT plan of care cert/re-cert  Stiffness of right shoulder, not elsewhere classified - Plan: PT plan of care cert/re-cert      G-Codes - 49/20/10 1337    Functional Assessment Tool Used Clinical judgement.   Functional Limitation Carrying, moving and handling objects   Carrying, Moving and Handling Objects Current Status [phone]) At least 60 percent but less than 80 percent impaired, limited or restricted   Carrying, Moving and Handling Objects Goal Status ([id number]) At least 20 percent but less than 40 percent impaired, limited or restricted       Problem List Patient Active Problem List   Diagnosis Date Noted  . Osteoporosis 10/09/2014  . At high risk for falls 10/09/2014  . Hx of cancer of lung 08/01/2014  . Hx of brain cancer 08/01/2014  . Personal history of colonic polyps - adenomas 07/18/2013  . CKD (chronic kidney disease) stage 3, GFR 30-59 ml/min 07/27/2012  . Syncope 03/22/2011  . Hypotension 03/22/2011  . Memory impairment 03/22/2011  . History of cancer 03/22/2011  . Hyperlipidemia 03/22/2011    Amanuel Sinkfield, [place] MPT 04/30/2016, 1:50 PM  [facility] [address] [place], [place], [address] Phone: [phone]   Fax:  [phone]  Name:  FLORIA BRANDAU MRN: [id number] Date of Birth: [birth year] PHYSICAL THERAPY DISCHARGE SUMMARY  Visits from Start of Care: 1.  Current functional level related to goals / functional outcomes: See above.   Remaining deficits: See below   Education / Equipment:  Plan: Patient agrees to discharge.  Patient goals were not met. Patient is being discharged due to not returning since the last visit.  ?????        [place] Raysean Graumann MPT

## 2016-04-30 NOTE — Patient Instructions (Signed)
Instructed patient in PAROM into flexion in supine with patient grasping right wrist with left hand and supine passive cane exercise to increase ER.

## 2016-05-03 ENCOUNTER — Other Ambulatory Visit: Payer: Self-pay | Admitting: Family Medicine

## 2016-05-03 ENCOUNTER — Other Ambulatory Visit (INDEPENDENT_AMBULATORY_CARE_PROVIDER_SITE_OTHER): Payer: Self-pay | Admitting: Orthopaedic Surgery

## 2016-05-03 ENCOUNTER — Encounter: Payer: Self-pay | Admitting: Family Medicine

## 2016-05-04 ENCOUNTER — Other Ambulatory Visit (INDEPENDENT_AMBULATORY_CARE_PROVIDER_SITE_OTHER): Payer: Self-pay | Admitting: Orthopaedic Surgery

## 2016-05-04 MED ORDER — HYDROCODONE-ACETAMINOPHEN 5-325 MG PO TABS: 1.0000 | ORAL_TABLET | Freq: Two times a day (BID) | ORAL | 0 refills | Status: DC | PRN

## 2016-05-04 NOTE — Progress Notes (Unsigned)
norcoPERCOC

## 2016-05-06 ENCOUNTER — Encounter: Payer: Medicare (Managed Care) | Admitting: Physical Therapy

## 2016-05-12 ENCOUNTER — Other Ambulatory Visit: Payer: Self-pay | Admitting: Family Medicine

## 2016-05-24 ENCOUNTER — Ambulatory Visit (INDEPENDENT_AMBULATORY_CARE_PROVIDER_SITE_OTHER): Payer: Medicare Other | Admitting: Physician Assistant

## 2016-05-30 ENCOUNTER — Other Ambulatory Visit (INDEPENDENT_AMBULATORY_CARE_PROVIDER_SITE_OTHER): Payer: Self-pay | Admitting: Orthopaedic Surgery

## 2016-05-31 ENCOUNTER — Telehealth (INDEPENDENT_AMBULATORY_CARE_PROVIDER_SITE_OTHER): Payer: Self-pay | Admitting: Orthopaedic Surgery

## 2016-05-31 ENCOUNTER — Other Ambulatory Visit (INDEPENDENT_AMBULATORY_CARE_PROVIDER_SITE_OTHER): Payer: Self-pay | Admitting: Orthopaedic Surgery

## 2016-05-31 MED ORDER — HYDROCODONE-ACETAMINOPHEN 5-325 MG PO TABS: 1.0000 | ORAL_TABLET | Freq: Three times a day (TID) | ORAL | 0 refills | Status: DC | PRN

## 2016-05-31 NOTE — Telephone Encounter (Signed)
Patient is needing a refill on either Oxycodone or hydrocodone. CB # [phone]

## 2016-06-14 ENCOUNTER — Ambulatory Visit (INDEPENDENT_AMBULATORY_CARE_PROVIDER_SITE_OTHER): Payer: Medicare Other | Admitting: Physician Assistant

## 2016-06-14 DIAGNOSIS — S42201S Unspecified fracture of upper end of right humerus, sequela: Secondary | ICD-10-CM

## 2016-06-14 DIAGNOSIS — M25511 Pain in right shoulder: Secondary | ICD-10-CM | POA: Diagnosis not present

## 2016-06-14 MED ORDER — LIDOCAINE HCL 1 % IJ SOLN
3.0000 mL | INTRAMUSCULAR | Status: AC | PRN
  Administered 2016-06-14: 3 mL

## 2016-06-14 MED ORDER — METHYLPREDNISOLONE ACETATE 40 MG/ML IJ SUSP
40.0000 mg | INTRAMUSCULAR | Status: AC | PRN
  Administered 2016-06-14: 40 mg via INTRA_ARTICULAR

## 2016-06-14 MED ORDER — IBUPROFEN 800 MG PO TABS: 800.0000 mg | ORAL_TABLET | Freq: Three times a day (TID) | ORAL | 0 refills | Status: DC | PRN

## 2016-06-14 NOTE — Progress Notes (Deleted)
   Office Visit Note   Patient: Jamie [name]           Date of Birth: 1950/12/27           MRN: [id number] Visit Date: 06/14/2016              Requested by: Wardell Honour, MD [place], [place] [address] PCP: Wardell Honour, MD   Assessment & Plan: Visit Diagnoses: No diagnosis found.  Plan: ***  Follow-Up Instructions: No Follow-up on file.   Orders:  No orders of the defined types were placed in this encounter.  No orders of the defined types were placed in this encounter.     Procedures: No procedures performed   Clinical Data: No additional findings.   Subjective: Chief Complaint  Patient presents with  . Right Shoulder - Follow-up    Patient here today to followup rightclosed nondisplaced fracture of anatomical neck of humerus. She states she is slowly "getting there". She is slowly able to raise her arm, but not much. By habit she is still holding her arm in the "sling" position.    Review of Systems   Objective: Vital Signs: There were no vitals taken for this visit.  Physical Exam  Ortho Exam  Specialty Comments:  No specialty comments available.  Imaging: No results found.   PMFS History: Patient Active Problem List   Diagnosis Date Noted  . Osteoporosis 10/09/2014  . At high risk for falls 10/09/2014  . Hx of cancer of lung 08/01/2014  . Hx of brain cancer 08/01/2014  . Personal history of colonic polyps - adenomas 07/18/2013  . CKD (chronic kidney disease) stage 3, GFR 30-59 ml/min 07/27/2012  . Syncope 03/22/2011  . Hypotension 03/22/2011  . Memory impairment 03/22/2011  . History of cancer 03/22/2011  . Hyperlipidemia 03/22/2011   Past Medical History:  Diagnosis Date  . Brain cancer ([place])   . Cancer ([place])   . CKD (chronic kidney disease), stage III   . High cholesterol   . Insomnia   . Lung cancer ([place])   . Osteoporosis     Family History  Problem Relation Age of Onset  . Cancer Mother     cirrhois of  liver  . Heart attack Father   . Colon cancer Father   . Asthma Brother     Past Surgical History:  Procedure Laterality Date  . ABDOMINAL HYSTERECTOMY  1986  . BRAIN SURGERY  2001   TUMOR REMOVAL  . catarac Bilateral   . EYE SURGERY    . LOBECTOMY     Social History   Occupational History  . Not on file.   Social History Main Topics  . Smoking status: Former Smoker    Years: 33.00    Types: Cigarettes    Quit date: 04/20/1999  . Smokeless tobacco: Never Used  . Alcohol use 1.8 oz/week    3 Cans of beer per week     Comment: occassional Saturday night 3 dark beer.  . Drug use: No  . Sexual activity: No

## 2016-06-14 NOTE — Progress Notes (Signed)
Office Visit Note   Patient: Jamie [name]           Date of Birth: Jun 05, 1950           MRN: [id number] Visit Date: 06/14/2016              Requested by: Wardell Honour, MD [place], [place] [address] PCP: Wardell Honour, MD   Assessment & Plan: Visit Diagnoses:  1. Acute pain of right shoulder   2. Closed fracture of proximal end of right humerus, unspecified fracture morphology, sequela     Plan: New prescriptions given for a pulley system for home use. Spent significant amount of time today with the patient showing her wall crawls, forward flexion exercises and discussed pulley system with her at length. We'll continue to work with physical therapy.  Follow-Up Instructions: Return in about 6 weeks (around 07/26/2016).   Orders:  Orders Placed This Encounter  Procedures  . Large Joint Injection/Arthrocentesis   Meds ordered this encounter  Medications  . ibuprofen (ADVIL,MOTRIN) 800 MG tablet    Sig: Take 1 tablet (800 mg total) by mouth every 8 (eight) hours as needed.    Dispense:  90 tablet    Refill:  0      Procedures: Large Joint Inj Date/Time: 06/14/2016 4:01 PM Performed by: Pete Pelt Authorized by: Pete Pelt   Consent Given by:  Patient Indications:  Pain Location:  Shoulder Site:  R subacromial bursa Needle Size:  22 G Needle Length:  1.5 inches Approach:  Lateral Ultrasound Guidance: No   Fluoroscopic Guidance: No   Arthrogram: No   Medications:  40 mg methylPREDNISolone acetate 40 MG/ML; 3 mL lidocaine 1 % Aspiration Attempted: No   Patient tolerance:  Patient tolerated the procedure well with no immediate complications     Clinical Data: No additional findings.   Subjective: Right shoulder pain status post right proximal humerus fracture  HPI Jamie [name] returns today follow-up of her right proximal humerus fracture which she sustained in October 2017. She been going to therapy. However due to her history of  brain cancer and memory impairment that she has difficulty remembering to do her home exercises. Does feel that she's made some improvement with her range of motion with physical therapy. Unfortunately she did not get a pulley system from therapy to use at home Review of Systems SEE HPI  Objective: Vital Signs: There were no vitals taken for this visit.  Physical Exam  Constitutional: She appears well-developed and well-nourished. No distress.    Ortho Exam She has limited forward flexion and abduction coming to only about 7580 actively passively I bring her to 90 but directions. Limited external rotation with pain. She holds her arm at and a flex 90 but I can actually bring her arm out to full extension she has full supination pronation of the forearm. 5 Out of 5 strengths with external/internal rotation against resistance bilateral shoulders Specialty Comments:  No specialty comments available.  Imaging: No results found.   PMFS History: Patient Active Problem List   Diagnosis Date Noted  . Osteoporosis 10/09/2014  . At high risk for falls 10/09/2014  . Hx of cancer of lung 08/01/2014  . Hx of brain cancer 08/01/2014  . Personal history of colonic polyps - adenomas 07/18/2013  . CKD (chronic kidney disease) stage 3, GFR 30-59 ml/min 07/27/2012  . Syncope 03/22/2011  . Hypotension 03/22/2011  . Memory impairment 03/22/2011  . History  of cancer 03/22/2011  . Hyperlipidemia 03/22/2011   Past Medical History:  Diagnosis Date  . Brain cancer ([place])   . Cancer ([place])   . CKD (chronic kidney disease), stage III   . High cholesterol   . Insomnia   . Lung cancer ([place])   . Osteoporosis     Family History  Problem Relation Age of Onset  . Cancer Mother     cirrhois of liver  . Heart attack Father   . Colon cancer Father   . Asthma Brother     Past Surgical History:  Procedure Laterality Date  . ABDOMINAL HYSTERECTOMY  1986  . BRAIN SURGERY  2001   TUMOR REMOVAL  .  catarac Bilateral   . EYE SURGERY    . LOBECTOMY     Social History   Occupational History  . Not on file.   Social History Main Topics  . Smoking status: Former Smoker    Years: 33.00    Types: Cigarettes    Quit date: 04/20/1999  . Smokeless tobacco: Never Used  . Alcohol use 1.8 oz/week    3 Cans of beer per week     Comment: occassional Saturday night 3 dark beer.  . Drug use: No  . Sexual activity: No

## 2016-06-21 ENCOUNTER — Encounter: Payer: Medicare (Managed Care) | Admitting: Physical Therapy

## 2016-07-06 ENCOUNTER — Ambulatory Visit: Payer: Medicare Other | Admitting: Family Medicine

## 2016-07-06 NOTE — Progress Notes (Deleted)
   Subjective:    Patient ID: Jamie [name], female    DOB: 1950-09-28, 66 y.o.   MRN: [id number]  HPI    Review of Systems     Objective:   Physical Exam        Assessment & Plan:

## 2016-07-07 ENCOUNTER — Ambulatory Visit: Payer: Medicare Other | Admitting: Family Medicine

## 2016-07-12 NOTE — Telephone Encounter (Signed)
error 

## 2016-07-19 ENCOUNTER — Other Ambulatory Visit (INDEPENDENT_AMBULATORY_CARE_PROVIDER_SITE_OTHER): Payer: Self-pay | Admitting: Orthopaedic Surgery

## 2016-07-20 ENCOUNTER — Other Ambulatory Visit (INDEPENDENT_AMBULATORY_CARE_PROVIDER_SITE_OTHER): Payer: Self-pay | Admitting: Physician Assistant

## 2016-07-20 MED ORDER — HYDROCODONE-ACETAMINOPHEN 5-325 MG PO TABS: 1.0000 | ORAL_TABLET | Freq: Three times a day (TID) | ORAL | 0 refills | Status: DC | PRN

## 2016-07-23 ENCOUNTER — Ambulatory Visit (INDEPENDENT_AMBULATORY_CARE_PROVIDER_SITE_OTHER): Payer: Medicare Other | Admitting: Physician Assistant

## 2016-07-23 DIAGNOSIS — M25511 Pain in right shoulder: Secondary | ICD-10-CM

## 2016-07-23 DIAGNOSIS — G8929 Other chronic pain: Secondary | ICD-10-CM

## 2016-07-23 NOTE — Progress Notes (Signed)
Office Visit Note   Patient: Jamie [name]           Date of Birth: 1950-05-20           MRN: [id number] Visit Date: 07/23/2016              Requested by: Wardell Honour, MD [address] [place], [place] [address] PCP: Terald Sleeper, PA-C   Assessment & Plan: Visit Diagnoses:  1. Chronic right shoulder pain     Plan: Encouraged her to continue to work on range of motion of the shoulder. Topical nystatin was called in for her for her candidate is a cutaneous axillary region on the right. Did discuss with her keeping this area dry.  Follow-Up Instructions: Return if symptoms worsen or fail to improve.   Orders:  No orders of the defined types were placed in this encounter.  No orders of the defined types were placed in this encounter.     Procedures: No procedures performed   Clinical Data: No additional findings.   Subjective: No chief complaint on file.   HPI Jamie [name] returns today follow-up of her right shoulder. States injection did help. States the overall range of motion the shoulder is getting better. She did not get an overhead pulley she's been has not been back to physical therapy. Review of Systems Denies any change in urinary frequency. No dysuria. No fevers no chills shortness breath and chest pain  Objective: Vital Signs: There were no vitals taken for this visit.  Physical Exam Alert and oriented 3 Ortho Exam shoulder she still has limited external rotation. We'll bring her 90 of abduction. Forward flexion she comes to approximately 100 actively. Right axillary region she has cutaneous candidiasis.  Specialty Comments:  No specialty comments available.  Imaging: No results found.   PMFS History: Patient Active Problem List   Diagnosis Date Noted  . Osteoporosis 10/09/2014  . At high risk for falls 10/09/2014  . Hx of cancer of lung 08/01/2014  . Hx of brain cancer 08/01/2014  . Personal history of colonic polyps - adenomas 07/18/2013    . CKD (chronic kidney disease) stage 3, GFR 30-59 ml/min 07/27/2012  . Syncope 03/22/2011  . Hypotension 03/22/2011  . Memory impairment 03/22/2011  . History of cancer 03/22/2011  . Hyperlipidemia 03/22/2011   Past Medical History:  Diagnosis Date  . Brain cancer ([place])   . Cancer ([place])   . CKD (chronic kidney disease), stage III   . High cholesterol   . Insomnia   . Lung cancer ([place])   . Osteoporosis     Family History  Problem Relation Age of Onset  . Cancer Mother     cirrhois of liver  . Heart attack Father   . Colon cancer Father   . Asthma Brother     Past Surgical History:  Procedure Laterality Date  . ABDOMINAL HYSTERECTOMY  1986  . BRAIN SURGERY  2001   TUMOR REMOVAL  . catarac Bilateral   . EYE SURGERY    . LOBECTOMY     Social History   Occupational History  . Not on file.   Social History Main Topics  . Smoking status: Former Smoker    Years: 33.00    Types: Cigarettes    Quit date: 04/20/1999  . Smokeless tobacco: Never Used  . Alcohol use 1.8 oz/week    3 Cans of beer per week     Comment: occassional Saturday night 3 dark beer.  Marland Kitchen  Drug use: No  . Sexual activity: No

## 2016-07-27 ENCOUNTER — Ambulatory Visit: Payer: Medicare Other | Admitting: Physician Assistant

## 2016-07-28 ENCOUNTER — Encounter: Payer: Self-pay | Admitting: Physician Assistant

## 2016-08-03 ENCOUNTER — Ambulatory Visit (INDEPENDENT_AMBULATORY_CARE_PROVIDER_SITE_OTHER): Payer: Medicare Other | Admitting: Physician Assistant

## 2016-08-03 ENCOUNTER — Other Ambulatory Visit: Payer: Self-pay | Admitting: Physician Assistant

## 2016-08-03 ENCOUNTER — Encounter: Payer: Self-pay | Admitting: Physician Assistant

## 2016-08-03 VITALS — BP 126/83 | HR 79 | Temp 96.8°F | Ht 66.0 in | Wt 169.0 lb

## 2016-08-03 DIAGNOSIS — E7800 Pure hypercholesterolemia, unspecified: Secondary | ICD-10-CM | POA: Diagnosis not present

## 2016-08-03 DIAGNOSIS — Z85841 Personal history of malignant neoplasm of brain: Secondary | ICD-10-CM

## 2016-08-03 DIAGNOSIS — B372 Candidiasis of skin and nail: Secondary | ICD-10-CM

## 2016-08-03 DIAGNOSIS — N183 Chronic kidney disease, stage 3 unspecified: Secondary | ICD-10-CM

## 2016-08-03 DIAGNOSIS — Z923 Personal history of irradiation: Secondary | ICD-10-CM | POA: Diagnosis not present

## 2016-08-03 DIAGNOSIS — R413 Other amnesia: Secondary | ICD-10-CM

## 2016-08-03 DIAGNOSIS — Z9181 History of falling: Secondary | ICD-10-CM | POA: Diagnosis not present

## 2016-08-03 DIAGNOSIS — Z1231 Encounter for screening mammogram for malignant neoplasm of breast: Secondary | ICD-10-CM

## 2016-08-03 MED ORDER — IBUPROFEN 800 MG PO TABS: 800.0000 mg | ORAL_TABLET | Freq: Three times a day (TID) | ORAL | 5 refills | Status: DC | PRN

## 2016-08-03 MED ORDER — CLOTRIMAZOLE-BETAMETHASONE 1-0.05 % EX CREA: 1.0000 "application " | TOPICAL_CREAM | Freq: Two times a day (BID) | CUTANEOUS | 2 refills | Status: DC

## 2016-08-03 MED ORDER — ROSUVASTATIN CALCIUM 10 MG PO TABS: 10.0000 mg | ORAL_TABLET | Freq: Every day | ORAL | 1 refills | Status: DC

## 2016-08-03 MED ORDER — VALACYCLOVIR HCL 1 G PO TABS: ORAL_TABLET | ORAL | 12 refills | Status: DC

## 2016-08-03 NOTE — Progress Notes (Signed)
   There were no vitals taken for this visit.   Subjective:    Patient ID: Jamie [name], female    DOB: Jul 14, 1950, 66 y.o.   MRN: [id number]  HPI: Jamie [name] is a 66 y.o. female presenting on 08/03/2016 for No chief complaint on file.    Relevant past medical, surgical, family and social history reviewed and updated as indicated. Allergies and medications reviewed and updated.  Past Medical History:  Diagnosis Date  . Brain cancer ([place])   . Cancer ([place])   . CKD (chronic kidney disease), stage III   . High cholesterol   . Insomnia   . Lung cancer ([place])   . Osteoporosis     Past Surgical History:  Procedure Laterality Date  . ABDOMINAL HYSTERECTOMY  1986  . BRAIN SURGERY  2001   TUMOR REMOVAL  . catarac Bilateral   . EYE SURGERY    . LOBECTOMY      Review of Systems  Allergies as of 08/03/2016      Reactions   Fosamax [alendronate Sodium] Other (See Comments)   "unknown"   Sulfonamide Derivatives    REACTION: Hives, tongue swells      Medication List       Accurate as of 08/03/16 10:30 PM. Always use your most recent med list.          ALIVE WOMENS ENERGY PO Take 1 tablet by mouth daily.   aspirin EC 81 MG tablet Take 81 mg by mouth daily.   clotrimazole-betamethasone cream Commonly known as:  LOTRISONE Apply 1 application topically 2 (two) times daily.   folic acid 1 MG tablet Commonly known as:  FOLVITE Take 1 mg by mouth daily.   ibuprofen 800 MG tablet Commonly known as:  ADVIL,MOTRIN Take 1 tablet (800 mg total) by mouth every 8 (eight) hours as needed.   rosuvastatin 10 MG tablet Commonly known as:  CRESTOR Take 1 tablet (10 mg total) by mouth at bedtime.   valACYclovir 1000 MG tablet Commonly known as:  VALTREX Take 2 tabs at onset, then 2 tabs 1 hour later          Objective:    There were no vitals taken for this visit.  Allergies  Allergen Reactions  . Fosamax [Alendronate Sodium] Other (See Comments)    "unknown"  .  Sulfonamide Derivatives     REACTION: Hives, tongue swells    Physical Exam  Results for orders placed or performed in visit on 07/30/15  HM MAMMOGRAPHY  Result Value Ref Range   HM Mammogram 0-4 Bi-Rad 0-4 Bi-Rad, Self Reported Normal      Assessment & Plan:   1. Screening mammogram, encounter for - MM Digital Screening; Future   Continue all other maintenance medications as listed above.  Follow up plan: No Follow-up on file.  Educational handout given for   Terald Sleeper PA-C [facility] [address]  [place], [place] [address] [phone]   08/03/2016, 10:30 PM

## 2016-08-03 NOTE — Patient Instructions (Signed)
Allergic Rhinitis Allergic rhinitis is when the mucous membranes in the nose respond to allergens. Allergens are particles in the air that cause your body to have an allergic reaction. This causes you to release allergic antibodies. Through a chain of events, these eventually cause you to release histamine into the blood stream. Although meant to protect the body, it is this release of histamine that causes your discomfort, such as frequent sneezing, congestion, and an itchy, runny nose. What are the causes? Seasonal allergic rhinitis (hay fever) is caused by pollen allergens that may come from grasses, trees, and weeds. Year-round allergic rhinitis (perennial allergic rhinitis) is caused by allergens such as house dust mites, pet dander, and mold spores. What are the signs or symptoms?  Nasal stuffiness (congestion).  Itchy, runny nose with sneezing and tearing of the eyes. How is this diagnosed? Your health care provider can help you determine the allergen or allergens that trigger your symptoms. If you and your health care provider are unable to determine the allergen, skin or blood testing may be used. Your health care provider will diagnose your condition after taking your health history and performing a physical exam. Your health care provider may assess you for other related conditions, such as asthma, pink eye, or an ear infection. How is this treated? Allergic rhinitis does not have a cure, but it can be controlled by:  Medicines that block allergy symptoms. These may include allergy shots, nasal sprays, and oral antihistamines.  Avoiding the allergen. Hay fever may often be treated with antihistamines in pill or nasal spray forms. Antihistamines block the effects of histamine. There are over-the-counter medicines that may help with nasal congestion and swelling around the eyes. Check with your health care provider before taking or giving this medicine. If avoiding the allergen or the  medicine prescribed do not work, there are many new medicines your health care provider can prescribe. Stronger medicine may be used if initial measures are ineffective. Desensitizing injections can be used if medicine and avoidance does not work. Desensitization is when a patient is given ongoing shots until the body becomes less sensitive to the allergen. Make sure you follow up with your health care provider if problems continue. Follow these instructions at home: It is not possible to completely avoid allergens, but you can reduce your symptoms by taking steps to limit your exposure to them. It helps to know exactly what you are allergic to so that you can avoid your specific triggers. Contact a health care provider if:  You have a fever.  You develop a cough that does not stop easily (persistent).  You have shortness of breath.  You start wheezing.  Symptoms interfere with normal daily activities. This information is not intended to replace advice given to you by your health care provider. Make sure you discuss any questions you have with your health care provider. Document Released: 12/29/2000 Document Revised: 12/05/2015 Document Reviewed: 12/11/2012 Elsevier Interactive Patient Education  2017 Elsevier Inc.  

## 2016-08-03 NOTE — Progress Notes (Signed)
BP 126/83   Pulse 79   Temp (!) 96.8 F (36 C) (Oral)   Ht '5\' 6"'  (1.676 m)   Wt 169 lb (76.7 kg)   BMI 27.28 kg/m    Subjective:    Patient ID: Jamie [name], female    DOB: 1950-04-29, 66 y.o.   MRN: [id number]  HPI: Jamie [name] is a 66 y.o. female presenting on 08/03/2016 for Follow-up (6 mth and labs)  This patient comes in for periodic recheck on medications and conditions including hyperlipidemia, history of brain tumor and radiation exposure, memory loss, risk for falls, history of shoulder fracture, history of renal impairment, history of lung cancer.  Jamie [name] comes in today to be established. She was formerly seen by Dr. Sabra Heck. It has been 6 months since her last visit. She has a friend that is a caretaker that is here with her. Her name is [name] and has permission to help with her medical information. She does need lab work performed and we will order that today. All of her medications are reviewed and will be sent for refills as needed. She does have a yeast infection in her right axilla. She recently had the shoulder healing and some physical therapy going on. She does not move the shoulder as much as she should.  All medications are reviewed today. There are no reports of any problems with the medications. All of the medical conditions are reviewed and updated.  Lab work is reviewed and will be ordered as medically necessary. There are no new problems reported with today's visit.  Relevant past medical, surgical, family and social history reviewed and updated as indicated. Allergies and medications reviewed and updated.  Past Medical History:  Diagnosis Date  . Brain cancer ([place])   . Cancer ([place])   . CKD (chronic kidney disease), stage III   . High cholesterol   . Insomnia   . Lung cancer ([place])   . Osteoporosis     Past Surgical History:  Procedure Laterality Date  . ABDOMINAL HYSTERECTOMY  1986  . BRAIN SURGERY  2001   TUMOR REMOVAL  . catarac  Bilateral   . EYE SURGERY    . LOBECTOMY      Review of Systems  Constitutional: Negative for activity change, fatigue and fever.  HENT: Negative.   Eyes: Negative.   Respiratory: Negative.  Negative for cough.   Cardiovascular: Negative.  Negative for chest pain.  Gastrointestinal: Negative.  Negative for abdominal pain.  Endocrine: Negative.   Genitourinary: Negative.  Negative for dysuria.  Musculoskeletal: Positive for arthralgias and joint swelling.  Skin: Positive for color change and rash.  Neurological: Positive for speech difficulty, weakness and headaches. Negative for dizziness, tremors, seizures, syncope, facial asymmetry, light-headedness and numbness.    Allergies as of 08/03/2016      Reactions   Fosamax [alendronate Sodium] Other (See Comments)   "unknown"   Sulfonamide Derivatives    REACTION: Hives, tongue swells      Medication List       Accurate as of 08/03/16  2:22 PM. Always use your most recent med list.          ALIVE WOMENS ENERGY PO Take 1 tablet by mouth daily.   aspirin EC 81 MG tablet Take 81 mg by mouth daily.   clotrimazole-betamethasone cream Commonly known as:  LOTRISONE Apply 1 application topically 2 (two) times daily.   folic acid 1 MG tablet Commonly known as:  [employer]  Take 1 mg by mouth daily.   ibuprofen 800 MG tablet Commonly known as:  ADVIL,MOTRIN Take 1 tablet (800 mg total) by mouth every 8 (eight) hours as needed.   rosuvastatin 10 MG tablet Commonly known as:  CRESTOR Take 1 tablet (10 mg total) by mouth at bedtime.   valACYclovir 1000 MG tablet Commonly known as:  VALTREX Take 2 tabs at onset, then 2 tabs 1 hour later          Objective:    BP 126/83   Pulse 79   Temp (!) 96.8 F (36 C) (Oral)   Ht '5\' 6"'  (1.676 m)   Wt 169 lb (76.7 kg)   BMI 27.28 kg/m   Allergies  Allergen Reactions  . Fosamax [Alendronate Sodium] Other (See Comments)    "unknown"  . Sulfonamide Derivatives     REACTION:  Hives, tongue swells    Physical Exam  Constitutional: She is oriented to person, place, and time. She appears well-developed and well-nourished. She is cooperative. No distress.  Delay in speech, patient is thought for about her words. She is able to conduct conversations well but is delayed. Caregivers in the room with her.  HENT:  Head: Normocephalic and atraumatic.  Eyes: Conjunctivae and EOM are normal. Pupils are equal, round, and reactive to light.  Cardiovascular: Normal rate, regular rhythm, normal heart sounds and intact distal pulses.   Pulmonary/Chest: Effort normal and breath sounds normal.  Abdominal: Soft. Bowel sounds are normal.  Neurological: She is alert and oriented to person, place, and time. She has normal reflexes. She displays no atrophy. No cranial nerve deficit or sensory deficit. She exhibits abnormal muscle tone. She displays no seizure activity. Gait abnormal.  Skin: Skin is warm and dry. No rash noted.  Psychiatric: She has a normal mood and affect. Her behavior is normal. Judgment and thought content normal.    Results for orders placed or performed in visit on 07/30/15  HM MAMMOGRAPHY  Result Value Ref Range   HM Mammogram 0-4 Bi-Rad 0-4 Bi-Rad, Self Reported Normal      Assessment & Plan:   1. CKD (chronic kidney disease) stage 3, GFR 30-59 ml/min - Lipid panel - CBC with Differential/Platelet - CMP14+EGFR  2. Memory impairment - Ambulatory referral to Neurology  3. At high risk for falls - Ambulatory referral to Neurology  4. Hx of brain cancer - Ambulatory referral to Neurology  5. History of therapeutic radiation - Ambulatory referral to Neurology  6. Candida infection of flexural skin - clotrimazole-betamethasone (LOTRISONE) cream; Apply 1 application topically 2 (two) times daily.  Dispense: 60 g; Refill: 2  7. Pure hypercholesterolemia - rosuvastatin (CRESTOR) 10 MG tablet; Take 1 tablet (10 mg total) by mouth at bedtime.  Dispense:  90 tablet; Refill: 1   Current Outpatient Prescriptions:  .  aspirin EC 81 MG tablet, Take 81 mg by mouth daily.  , Disp: , Rfl:  .  folic acid (FOLVITE) 1 MG tablet, Take 1 mg by mouth daily., Disp: , Rfl:  .  ibuprofen (ADVIL,MOTRIN) 800 MG tablet, Take 1 tablet (800 mg total) by mouth every 8 (eight) hours as needed., Disp: 90 tablet, Rfl: 5 .  Multiple Vitamins-Minerals (ALIVE WOMENS ENERGY PO), Take 1 tablet by mouth daily., Disp: , Rfl:  .  rosuvastatin (CRESTOR) 10 MG tablet, Take 1 tablet (10 mg total) by mouth at bedtime., Disp: 90 tablet, Rfl: 1 .  valACYclovir (VALTREX) 1000 MG tablet, Take 2 tabs at onset, then  2 tabs 1 hour later, Disp: 12 tablet, Rfl: 12 .  clotrimazole-betamethasone (LOTRISONE) cream, Apply 1 application topically 2 (two) times daily., Disp: 60 g, Rfl: 2  Continue all other maintenance medications as listed above.  Follow up plan: Return in about 6 months (around 02/02/2017) for recheck.  Educational handout given for allergic rhinitis  Terald Sleeper PA-C [facility] [address]  [place], [place] [address] [phone]   08/03/2016, 2:22 PM

## 2016-08-04 LAB — CMP14+EGFR
ALT: 20 IU/L (ref 0–32)
AST: 19 IU/L (ref 0–40)
Albumin/Globulin Ratio: 2.2 (ref 1.2–2.2)
Albumin: 4.6 g/dL (ref 3.6–4.8)
Alkaline Phosphatase: 74 IU/L (ref 39–117)
BUN/Creatinine Ratio: 10 — ABNORMAL LOW (ref 12–28)
BUN: 11 mg/dL (ref 8–27)
Bilirubin Total: <0.2 mg/dL (ref 0.0–1.2)
CO2: 25 mmol/L (ref 18–29)
Calcium: 9.6 mg/dL (ref 8.7–10.3)
Chloride: 102 mmol/L (ref 96–106)
Creatinine, Ser: 1.05 mg/dL — ABNORMAL HIGH (ref 0.57–1.00)
GFR calc Af Amer: 65 mL/min/{1.73_m2} (ref >59)
GFR calc non Af Amer: 57 mL/min/{1.73_m2} — ABNORMAL LOW (ref >59)
Globulin, Total: 2.1 g/dL (ref 1.5–4.5)
Glucose: 88 mg/dL (ref 65–99)
Potassium: 4.5 mmol/L (ref 3.5–5.2)
Sodium: 144 mmol/L (ref 134–144)
Total Protein: 6.7 g/dL (ref 6.0–8.5)

## 2016-08-04 LAB — CBC WITH DIFFERENTIAL/PLATELET
Basophils Absolute: 0.1 10*3/uL (ref 0.0–0.2)
Basos: 1 %
EOS (ABSOLUTE): 0.3 10*3/uL (ref 0.0–0.4)
Eos: 3 %
Hematocrit: 40.1 % (ref 34.0–46.6)
Hemoglobin: 13.6 g/dL (ref 11.1–15.9)
Immature Grans (Abs): 0 10*3/uL (ref 0.0–0.1)
Immature Granulocytes: 0 %
Lymphocytes Absolute: 1.5 10*3/uL (ref 0.7–3.1)
Lymphs: 19 %
MCH: 30.2 pg (ref 26.6–33.0)
MCHC: 33.9 g/dL (ref 31.5–35.7)
MCV: 89 fL (ref 79–97)
Monocytes Absolute: 0.6 10*3/uL (ref 0.1–0.9)
Monocytes: 8 %
Neutrophils Absolute: 5.4 10*3/uL (ref 1.4–7.0)
Neutrophils: 69 %
Platelets: 262 10*3/uL (ref 150–379)
RBC: 4.5 x10E6/uL (ref 3.77–5.28)
RDW: 13.7 % (ref 12.3–15.4)
WBC: 7.8 10*3/uL (ref 3.4–10.8)

## 2016-08-04 LAB — LIPID PANEL
Chol/HDL Ratio: 6 ratio — ABNORMAL HIGH (ref 0.0–4.4)
Cholesterol, Total: 266 mg/dL — ABNORMAL HIGH (ref 100–199)
HDL: 44 mg/dL (ref >39)
Triglycerides: 431 mg/dL — ABNORMAL HIGH (ref 0–149)

## 2016-09-14 ENCOUNTER — Ambulatory Visit: Payer: Medicare Other | Admitting: Diagnostic Neuroimaging

## 2016-09-15 ENCOUNTER — Encounter: Payer: Self-pay | Admitting: Diagnostic Neuroimaging

## 2016-12-15 DIAGNOSIS — Z1231 Encounter for screening mammogram for malignant neoplasm of breast: Secondary | ICD-10-CM | POA: Diagnosis not present

## 2017-02-04 ENCOUNTER — Ambulatory Visit: Payer: Medicare Other | Admitting: Physician Assistant

## 2017-02-16 ENCOUNTER — Ambulatory Visit (INDEPENDENT_AMBULATORY_CARE_PROVIDER_SITE_OTHER): Payer: Medicare Other | Admitting: Physician Assistant

## 2017-02-16 ENCOUNTER — Encounter: Payer: Self-pay | Admitting: Physician Assistant

## 2017-02-16 VITALS — BP 141/83 | HR 71 | Ht 66.0 in | Wt 174.0 lb

## 2017-02-16 DIAGNOSIS — Z85841 Personal history of malignant neoplasm of brain: Secondary | ICD-10-CM | POA: Diagnosis not present

## 2017-02-16 DIAGNOSIS — E785 Hyperlipidemia, unspecified: Secondary | ICD-10-CM

## 2017-02-16 DIAGNOSIS — R413 Other amnesia: Secondary | ICD-10-CM | POA: Insufficient documentation

## 2017-02-16 DIAGNOSIS — N183 Chronic kidney disease, stage 3 unspecified: Secondary | ICD-10-CM

## 2017-02-16 DIAGNOSIS — M810 Age-related osteoporosis without current pathological fracture: Secondary | ICD-10-CM

## 2017-02-16 DIAGNOSIS — E7800 Pure hypercholesterolemia, unspecified: Secondary | ICD-10-CM | POA: Diagnosis not present

## 2017-02-16 LAB — CBC WITH DIFFERENTIAL/PLATELET
Basophils Absolute: 0 10*3/uL (ref 0.0–0.2)
Basos: 0 %
EOS (ABSOLUTE): 0.2 10*3/uL (ref 0.0–0.4)
Eos: 3 %
Hematocrit: 40.4 % (ref 34.0–46.6)
Hemoglobin: 13.7 g/dL (ref 11.1–15.9)
Immature Grans (Abs): 0 10*3/uL (ref 0.0–0.1)
Immature Granulocytes: 0 %
Lymphocytes Absolute: 1.3 10*3/uL (ref 0.7–3.1)
Lymphs: 19 %
MCH: 30.6 pg (ref 26.6–33.0)
MCHC: 33.9 g/dL (ref 31.5–35.7)
MCV: 90 fL (ref 79–97)
Monocytes Absolute: 0.6 10*3/uL (ref 0.1–0.9)
Monocytes: 8 %
Neutrophils Absolute: 5.1 10*3/uL (ref 1.4–7.0)
Neutrophils: 70 %
Platelets: 258 10*3/uL (ref 150–379)
RBC: 4.48 x10E6/uL (ref 3.77–5.28)
RDW: 13.4 % (ref 12.3–15.4)
WBC: 7.2 10*3/uL (ref 3.4–10.8)

## 2017-02-16 LAB — CMP14+EGFR
ALT: 18 IU/L (ref 0–32)
AST: 24 IU/L (ref 0–40)
Albumin/Globulin Ratio: 3 — ABNORMAL HIGH (ref 1.2–2.2)
Albumin: 4.8 g/dL (ref 3.6–4.8)
Alkaline Phosphatase: 66 IU/L (ref 39–117)
BUN/Creatinine Ratio: 15 (ref 12–28)
BUN: 15 mg/dL (ref 8–27)
Bilirubin Total: 0.4 mg/dL (ref 0.0–1.2)
CO2: 27 mmol/L (ref 20–29)
Calcium: 9.7 mg/dL (ref 8.7–10.3)
Chloride: 103 mmol/L (ref 96–106)
Creatinine, Ser: 1.01 mg/dL — ABNORMAL HIGH (ref 0.57–1.00)
GFR calc Af Amer: 68 mL/min/{1.73_m2} (ref >59)
GFR calc non Af Amer: 59 mL/min/{1.73_m2} — ABNORMAL LOW (ref >59)
Globulin, Total: 1.6 g/dL (ref 1.5–4.5)
Glucose: 92 mg/dL (ref 65–99)
Potassium: 4.2 mmol/L (ref 3.5–5.2)
Sodium: 143 mmol/L (ref 134–144)
Total Protein: 6.4 g/dL (ref 6.0–8.5)

## 2017-02-16 LAB — LIPID PANEL
Chol/HDL Ratio: 3 ratio (ref 0.0–4.4)
Cholesterol, Total: 159 mg/dL (ref 100–199)
HDL: 53 mg/dL (ref >39)
LDL Calculated: 70 mg/dL (ref 0–99)
Triglycerides: 181 mg/dL — ABNORMAL HIGH (ref 0–149)
VLDL Cholesterol Cal: 36 mg/dL (ref 5–40)

## 2017-02-16 MED ORDER — DIAZEPAM 10 MG PO TABS: 10.0000 mg | ORAL_TABLET | Freq: Every evening | ORAL | 5 refills | Status: DC | PRN

## 2017-02-16 MED ORDER — FOLIC ACID 1 MG PO TABS: 1.0000 mg | ORAL_TABLET | Freq: Every day | ORAL | 3 refills | Status: DC

## 2017-02-16 MED ORDER — IBUPROFEN 800 MG PO TABS
800.0000 mg | ORAL_TABLET | Freq: Three times a day (TID) | ORAL | 5 refills | Status: DC | PRN
Start: 2017-02-16 — End: 2018-01-25

## 2017-02-16 MED ORDER — ROSUVASTATIN CALCIUM 10 MG PO TABS: 10.0000 mg | ORAL_TABLET | Freq: Every day | ORAL | 1 refills | Status: DC

## 2017-02-16 NOTE — Patient Instructions (Signed)
In a few days you may receive a survey in the mail or online from [employer] regarding your visit with us today. Please take a moment to fill this out. Your feedback is very important to our whole office. It can help us better understand your needs as well as improve your experience and satisfaction. Thank you for taking your time to complete it. We care about you.  Samella Lucchetti, PA-C  

## 2017-02-16 NOTE — Progress Notes (Signed)
BP (!) 141/83   Pulse 71   Ht 5' 6" (1.676 m)   Wt 174 lb (78.9 kg)   BMI 28.08 kg/m    Subjective:    Patient ID: Jamie [name], female    DOB: 04-10-1951, 66 y.o.   MRN: [id number]  HPI: Jamie [name] is a 66 y.o. female presenting on 02/16/2017 for Follow-up (6 month )  This patient comes in with her aide today.  Is her 87-monthcheckup.  She has a history of brain cancer and radiation that has caused severe memory loss and deficits.  Her gait is still normal but she is getting weaker.  She is trying to use a walker as much as possible.  There have been added stressors of the fact that 2 of her children have moved back into her very small trailer.  There are also grandchildren in this situation.  Her aide is actually a close friend and quite astute in describing this.  They are going to work with housing authority in finding appropriate house for her and her disability.  [place]  All of her labs and medications are reviewed.  We will send refills and as they are due.  Labs will also be performed. Relevant past medical, surgical, family and social history reviewed and updated as indicated. Allergies and medications reviewed and updated.  Past Medical History:  Diagnosis Date  . [place] ([place]   . Cancer ([place]   . CKD (chronic kidney disease), stage III ([place]   . High cholesterol   . Insomnia   . Lung cancer ([facility]   . Osteoporosis     Past Surgical History:  Procedure Laterality Date  . ABDOMINAL HYSTERECTOMY  1986  . BRAIN SURGERY  2001   TUMOR REMOVAL  . catarac Bilateral   . EYE SURGERY    . LOBECTOMY      Review of Systems  Constitutional: Positive for fatigue. Negative for activity change and fever.  HENT: Negative.   Eyes: Negative.   Respiratory: Negative.  Negative for cough.   Cardiovascular: Negative.  Negative for chest pain.  Gastrointestinal: Negative.  Negative for abdominal pain.  Endocrine: Negative.     Genitourinary: Negative.  Negative for dysuria.  Musculoskeletal: Positive for gait problem.  Skin: Negative.   Neurological: Positive for speech difficulty, weakness, light-headedness and headaches. Negative for tremors, seizures, syncope and numbness.    Allergies as of 02/16/2017      Reactions   Fosamax [alendronate Sodium] Other (See Comments)   "unknown"   Sulfonamide Derivatives    REACTION: Hives, tongue swells      Medication List       Accurate as of 02/16/17 12:19 PM. Always use your most recent med list.          ALIVE WOMENS ENERGY PO Take 1 tablet by mouth daily.   aspirin EC 81 MG tablet Take 81 mg by mouth daily.   clotrimazole-betamethasone cream Commonly known as:  LOTRISONE Apply 1 application topically 2 (two) times daily.   diazepam 10 MG tablet Commonly known as:  VALIUM Take 1 tablet (10 mg total) by mouth at bedtime as needed for anxiety.   folic acid 1 MG tablet Commonly known as:  FOLVITE Take 1 tablet (1 mg total) by mouth daily.   ibuprofen 800 MG tablet Commonly known as:  ADVIL,MOTRIN Take 1 tablet (800 mg total) by mouth every 8 (eight) hours as needed.  rosuvastatin 10 MG tablet Commonly known as:  CRESTOR Take 1 tablet (10 mg total) by mouth at bedtime.   valACYclovir 1000 MG tablet Commonly known as:  VALTREX Take 2 tabs at onset, then 2 tabs 1 hour later          Objective:    BP (!) 141/83   Pulse 71   Ht '5\' 6"'$  (1.676 m)   Wt 174 lb (78.9 kg)   BMI 28.08 kg/m   Allergies  Allergen Reactions  . Fosamax [Alendronate Sodium] Other (See Comments)    "unknown"  . Sulfonamide Derivatives     REACTION: Hives, tongue swells    Physical Exam  Constitutional: She is oriented to person, place, and time. She appears well-developed and well-nourished.  HENT:  Head: Normocephalic and atraumatic.  Right Ear: Tympanic membrane, external ear and ear canal normal.  Left Ear: Tympanic membrane, external ear and ear canal  normal.  Nose: Nose normal. No rhinorrhea.  Mouth/Throat: Oropharynx is clear and moist and mucous membranes are normal. No oropharyngeal exudate or posterior oropharyngeal erythema.  Eyes: Pupils are equal, round, and reactive to light. Conjunctivae and EOM are normal.  Neck: Normal range of motion. Neck supple.  Cardiovascular: Normal rate, regular rhythm, normal heart sounds and intact distal pulses.   Pulmonary/Chest: Effort normal and breath sounds normal.  Abdominal: Soft. Bowel sounds are normal.  Neurological: She is alert and oriented to person, place, and time. She has normal reflexes. She displays no tremor. No cranial nerve deficit or sensory deficit. She exhibits abnormal muscle tone.  Slow guarded gait, accompanied by aide  Skin: Skin is warm and dry. No rash noted.  Psychiatric: She has a normal mood and affect. Her behavior is normal. Judgment and thought content normal.    Results for orders placed or performed in visit on 08/03/16  Lipid panel  Result Value Ref Range   Cholesterol, Total 266 (H) 100 - 199 mg/dL   Triglycerides 431 (H) 0 - 149 mg/dL   HDL 44 >39 mg/dL   VLDL Cholesterol Cal Comment 5 - 40 mg/dL   LDL Calculated Comment 0 - 99 mg/dL   Chol/HDL Ratio 6.0 (H) 0.0 - 4.4 ratio  CBC with Differential/Platelet  Result Value Ref Range   WBC 7.8 3.4 - 10.8 x10E3/uL   RBC 4.50 3.77 - 5.28 x10E6/uL   Hemoglobin 13.6 11.1 - 15.9 g/dL   Hematocrit 40.1 34.0 - 46.6 %   MCV 89 79 - 97 fL   MCH 30.2 26.6 - 33.0 pg   MCHC 33.9 31.5 - 35.7 g/dL   RDW 13.7 12.3 - 15.4 %   Platelets 262 150 - 379 x10E3/uL   Neutrophils 69 Not Estab. %   Lymphs 19 Not Estab. %   Monocytes 8 Not Estab. %   Eos 3 Not Estab. %   Basos 1 Not Estab. %   Neutrophils Absolute 5.4 1.4 - 7.0 x10E3/uL   Lymphocytes Absolute 1.5 0.7 - 3.1 x10E3/uL   Monocytes Absolute 0.6 0.1 - 0.9 x10E3/uL   EOS (ABSOLUTE) 0.3 0.0 - 0.4 x10E3/uL   Basophils Absolute 0.1 0.0 - 0.2 x10E3/uL   Immature  Granulocytes 0 Not Estab. %   Immature Grans (Abs) 0.0 0.0 - 0.1 x10E3/uL  CMP14+EGFR  Result Value Ref Range   Glucose 88 65 - 99 mg/dL   BUN 11 8 - 27 mg/dL   Creatinine, Ser 1.05 (H) 0.57 - 1.00 mg/dL   GFR calc non Af Amer 57 (  L) >59 mL/min/1.73   GFR calc Af Amer 65 >59 mL/min/1.73   BUN/Creatinine Ratio 10 (L) 12 - 28   Sodium 144 134 - 144 mmol/L   Potassium 4.5 3.5 - 5.2 mmol/L   Chloride 102 96 - 106 mmol/L   CO2 25 18 - 29 mmol/L   Calcium 9.6 8.7 - 10.3 mg/dL   Total Protein 6.7 6.0 - 8.5 g/dL   Albumin 4.6 3.6 - 4.8 g/dL   Globulin, Total 2.1 1.5 - 4.5 g/dL   Albumin/Globulin Ratio 2.2 1.2 - 2.2   Bilirubin Total <0.2 0.0 - 1.2 mg/dL   Alkaline Phosphatase 74 39 - 117 IU/L   AST 19 0 - 40 IU/L   ALT 20 0 - 32 IU/L      Assessment & Plan:   1. CKD (chronic kidney disease) stage 3, GFR 30-59 ml/min (HCC) - CMP14+EGFR - CBC with Differential/Platelet - Lipid panel  2. Hyperlipidemia, unspecified hyperlipidemia type - CMP14+EGFR - CBC with Differential/Platelet - Lipid panel  3. Osteoporosis, unspecified osteoporosis type, unspecified pathological fracture presence   4. Pure hypercholesterolemia - rosuvastatin (CRESTOR) 10 MG tablet; Take 1 tablet (10 mg total) by mouth at bedtime.  Dispense: 90 tablet; Refill: 1 - Lipid panel  5. Hx of brain cancer - Ambulatory referral to Neurology  6. Memory loss - Ambulatory referral to Neurology    Current Outpatient Prescriptions:  .  aspirin EC 81 MG tablet, Take 81 mg by mouth daily.  , Disp: , Rfl:  .  clotrimazole-betamethasone (LOTRISONE) cream, Apply 1 application topically 2 (two) times daily., Disp: 60 g, Rfl: 2 .  diazepam (VALIUM) 10 MG tablet, Take 1 tablet (10 mg total) by mouth at bedtime as needed for anxiety., Disp: 30 tablet, Rfl: 5 .  folic acid (FOLVITE) 1 MG tablet, Take 1 tablet (1 mg total) by mouth daily., Disp: 90 tablet, Rfl: 3 .  ibuprofen (ADVIL,MOTRIN) 800 MG tablet, Take 1 tablet (800  mg total) by mouth every 8 (eight) hours as needed., Disp: 90 tablet, Rfl: 5 .  Multiple Vitamins-Minerals (ALIVE WOMENS ENERGY PO), Take 1 tablet by mouth daily., Disp: , Rfl:  .  rosuvastatin (CRESTOR) 10 MG tablet, Take 1 tablet (10 mg total) by mouth at bedtime., Disp: 90 tablet, Rfl: 1 .  valACYclovir (VALTREX) 1000 MG tablet, Take 2 tabs at onset, then 2 tabs 1 hour later, Disp: 12 tablet, Rfl: 12 Continue all other maintenance medications as listed above.  Follow up plan: Return in about 6 months (around 08/16/2017) for recheck.  Educational handout given for [place] PA-C [facility] [address]  [place], [place] [address] [phone]   02/16/2017, 12:19 PM

## 2017-04-22 ENCOUNTER — Telehealth: Payer: Self-pay

## 2017-04-22 NOTE — Telephone Encounter (Signed)
[facility]   Patient was contacted due to an elevated PHQ-9 score.  Patient reports that she does not want to receive [facility] services

## 2017-06-21 DIAGNOSIS — R2689 Other abnormalities of gait and mobility: Secondary | ICD-10-CM | POA: Insufficient documentation

## 2017-06-21 DIAGNOSIS — R270 Ataxia, unspecified: Secondary | ICD-10-CM | POA: Insufficient documentation

## 2017-06-21 DIAGNOSIS — F40240 Claustrophobia: Secondary | ICD-10-CM | POA: Insufficient documentation

## 2017-06-21 DIAGNOSIS — M5416 Radiculopathy, lumbar region: Secondary | ICD-10-CM | POA: Insufficient documentation

## 2017-06-21 DIAGNOSIS — R296 Repeated falls: Secondary | ICD-10-CM | POA: Insufficient documentation

## 2017-06-21 DIAGNOSIS — R413 Other amnesia: Secondary | ICD-10-CM | POA: Diagnosis not present

## 2017-07-06 DIAGNOSIS — M5116 Intervertebral disc disorders with radiculopathy, lumbar region: Secondary | ICD-10-CM | POA: Diagnosis not present

## 2017-07-25 DIAGNOSIS — M25511 Pain in right shoulder: Secondary | ICD-10-CM | POA: Diagnosis not present

## 2017-07-25 DIAGNOSIS — S42354A Nondisplaced comminuted fracture of shaft of humerus, right arm, initial encounter for closed fracture: Secondary | ICD-10-CM | POA: Diagnosis not present

## 2017-07-25 DIAGNOSIS — M7989 Other specified soft tissue disorders: Secondary | ICD-10-CM | POA: Diagnosis not present

## 2017-07-25 DIAGNOSIS — S42291A Other displaced fracture of upper end of right humerus, initial encounter for closed fracture: Secondary | ICD-10-CM | POA: Diagnosis not present

## 2017-07-25 DIAGNOSIS — S42351A Displaced comminuted fracture of shaft of humerus, right arm, initial encounter for closed fracture: Secondary | ICD-10-CM | POA: Diagnosis not present

## 2017-07-25 DIAGNOSIS — S43001A Unspecified subluxation of right shoulder joint, initial encounter: Secondary | ICD-10-CM | POA: Diagnosis not present

## 2017-07-25 DIAGNOSIS — M5416 Radiculopathy, lumbar region: Secondary | ICD-10-CM | POA: Diagnosis not present

## 2017-07-25 DIAGNOSIS — R2689 Other abnormalities of gait and mobility: Secondary | ICD-10-CM | POA: Diagnosis not present

## 2017-07-25 DIAGNOSIS — R296 Repeated falls: Secondary | ICD-10-CM | POA: Diagnosis not present

## 2017-07-25 DIAGNOSIS — R413 Other amnesia: Secondary | ICD-10-CM | POA: Diagnosis not present

## 2017-07-25 DIAGNOSIS — S40011A Contusion of right shoulder, initial encounter: Secondary | ICD-10-CM | POA: Diagnosis not present

## 2017-07-25 DIAGNOSIS — M519 Unspecified thoracic, thoracolumbar and lumbosacral intervertebral disc disorder: Secondary | ICD-10-CM | POA: Diagnosis not present

## 2017-08-01 ENCOUNTER — Ambulatory Visit (INDEPENDENT_AMBULATORY_CARE_PROVIDER_SITE_OTHER): Payer: Medicare Other

## 2017-08-01 ENCOUNTER — Ambulatory Visit (INDEPENDENT_AMBULATORY_CARE_PROVIDER_SITE_OTHER): Payer: Medicare Other | Admitting: Physician Assistant

## 2017-08-01 VITALS — Ht 66.0 in | Wt 174.0 lb

## 2017-08-01 DIAGNOSIS — M25511 Pain in right shoulder: Secondary | ICD-10-CM | POA: Diagnosis not present

## 2017-08-01 DIAGNOSIS — S4291XA Fracture of right shoulder girdle, part unspecified, initial encounter for closed fracture: Secondary | ICD-10-CM

## 2017-08-02 ENCOUNTER — Encounter (INDEPENDENT_AMBULATORY_CARE_PROVIDER_SITE_OTHER): Payer: Self-pay | Admitting: Physician Assistant

## 2017-08-02 NOTE — Progress Notes (Signed)
Office Visit Note   Patient: Jamie [name]           Date of Birth: 01/14/1951           MRN: [id number] Visit Date: 08/01/2017              Requested by: Terald Sleeper, PA-C [address] [place],  [id number] PCP: Terald Sleeper, PA-C   Assessment & Plan: Visit Diagnoses:  1. Right shoulder pain, unspecified chronicity   2. Closed fracture of right shoulder, initial encounter     Plan: She will work on gentle range of motion elbow wrist and hand.  Otherwise she will wear the sling even when sleeping.  Due to right constraints she will follow-up in 3 weeks instead of 2 weeks that time we will obtain AP and Y-view of the right shoulder.  Questions encouraged and answered at length today both the patient and her friend who is with her.  Follow-Up Instructions: Return in about 3 weeks (around 08/22/2017).   Orders:  Orders Placed This Encounter  Procedures  . XR Shoulder Right   No orders of the defined types were placed in this encounter.     Procedures: No procedures performed   Clinical Data: No additional findings.   Subjective: Chief Complaint  Patient presents with  . Right Shoulder - Pain    DOI 07/22/17 fell, landing on the right shoulder - rroximal humerus fracture H/o proximal humerus fracture 2017     HPI  Jamie [name] returns today due to fall on 07/22/2017 she fell while pushing a buggy in a grocery store striking her right arm.  This was not a syncopal episode she just had a mechanical fall.  She was seen through [facility] health care and had radiographs obtained of her right shoulder.  She has an acute right proximal humerus fracture.  She was recently treated for the same shoulder which healed from a proximal right humerus fracture.  Review of Systems No loss of consciousness, dizziness chest pain shortness of breath.  Objective: Vital Signs: Ht 5\' 6"  (1.676 m)   Wt 174 lb (78.9 kg)   BMI 28.08 kg/m   Physical Exam  Constitutional: She is oriented to  person, place, and time. She appears well-developed and well-nourished. No distress.  Pulmonary/Chest: Effort normal.  Neurological: She is alert and oriented to person, place, and time.  Skin: She is not diaphoretic.  Psychiatric: She has a normal mood and affect.    Ortho Exam Right shoulder positive ecchymosis down through out the entire shaft of the humerus.  Good range of motion the elbow without significant pain has full supination pronation of the right forearm.  Radial pulses intact.  Hand full.  Specialty Comments:  No specialty comments available.  Imaging: Xr Shoulder Right  Result Date: 08/02/2017 Right shoulder:: Shoulders well located.  Comminuted proximal right humerus fracture.  Comminuted fracture overall minimally displaced.      PMFS History: Patient Active Problem List   Diagnosis Date Noted  . Memory loss 02/16/2017  . History of therapeutic radiation 08/03/2016  . Osteoporosis 10/09/2014  . At high risk for falls 10/09/2014  . Hx of cancer of lung 08/01/2014  . Hx of brain cancer 08/01/2014  . Personal history of colonic polyps - adenomas 07/18/2013  . Syncope 03/22/2011  . Hypotension 03/22/2011  . Memory impairment 03/22/2011  . History of cancer 03/22/2011  . Hyperlipidemia 03/22/2011   Past Medical History:  Diagnosis Date  .  Brain cancer ([place])   . Cancer ([place])   . CKD (chronic kidney disease), stage III ([place])   . High cholesterol   . Insomnia   . Lung cancer ([place])   . Osteoporosis     Family History  Problem Relation Age of Onset  . Cancer Mother        cirrhois of liver  . Heart attack Father   . Colon cancer Father   . Asthma Brother     Past Surgical History:  Procedure Laterality Date  . ABDOMINAL HYSTERECTOMY  1986  . BRAIN SURGERY  2001   TUMOR REMOVAL  . catarac Bilateral   . EYE SURGERY    . LOBECTOMY     Social History   Occupational History  . Not on file  Tobacco Use  . Smoking status: Former Smoker    Years:  33.00    Types: Cigarettes    Last attempt to quit: 04/20/1999    Years since quitting: 18.2  . Smokeless tobacco: Never Used  Substance and Sexual Activity  . Alcohol use: Yes    Alcohol/week: 1.8 oz    Types: 3 Cans of beer per week    Comment: occassional Saturday night 3 dark beer.  . Drug use: No  . Sexual activity: Never

## 2017-08-16 ENCOUNTER — Ambulatory Visit: Payer: Medicare Other | Admitting: Physician Assistant

## 2017-08-24 ENCOUNTER — Ambulatory Visit (INDEPENDENT_AMBULATORY_CARE_PROVIDER_SITE_OTHER): Payer: Medicare Other | Admitting: Physician Assistant

## 2017-08-26 ENCOUNTER — Other Ambulatory Visit: Payer: Self-pay | Admitting: Physician Assistant

## 2017-10-06 ENCOUNTER — Other Ambulatory Visit: Payer: Self-pay | Admitting: Physician Assistant

## 2017-10-06 DIAGNOSIS — E7800 Pure hypercholesterolemia, unspecified: Secondary | ICD-10-CM

## 2017-10-06 NOTE — Telephone Encounter (Signed)
Last seen 02/16/17

## 2017-10-07 ENCOUNTER — Other Ambulatory Visit: Payer: Self-pay | Admitting: Physician Assistant

## 2017-10-07 MED ORDER — DIAZEPAM 10 MG PO TABS: ORAL_TABLET | ORAL | 0 refills | Status: DC

## 2017-10-07 NOTE — Telephone Encounter (Signed)
Mat refill one time and needs appt every 6 months

## 2017-10-22 ENCOUNTER — Emergency Department (HOSPITAL_COMMUNITY): Payer: Medicare Other

## 2017-10-22 ENCOUNTER — Emergency Department (HOSPITAL_COMMUNITY)
Admission: EM | Admit: 2017-10-22 | Discharge: 2017-10-22 | Disposition: A | Discharge status: Home / Self Care | Payer: Medicare Other | Attending: Emergency Medicine | Admitting: Emergency Medicine

## 2017-10-22 ENCOUNTER — Other Ambulatory Visit: Payer: Self-pay

## 2017-10-22 DIAGNOSIS — R404 Transient alteration of awareness: Secondary | ICD-10-CM | POA: Diagnosis not present

## 2017-10-22 DIAGNOSIS — Z79899 Other long term (current) drug therapy: Secondary | ICD-10-CM | POA: Diagnosis not present

## 2017-10-22 DIAGNOSIS — Z87891 Personal history of nicotine dependence: Secondary | ICD-10-CM | POA: Diagnosis not present

## 2017-10-22 DIAGNOSIS — R4182 Altered mental status, unspecified: Secondary | ICD-10-CM | POA: Insufficient documentation

## 2017-10-22 DIAGNOSIS — R509 Fever, unspecified: Secondary | ICD-10-CM | POA: Diagnosis not present

## 2017-10-22 DIAGNOSIS — N183 Chronic kidney disease, stage 3 (moderate): Secondary | ICD-10-CM | POA: Insufficient documentation

## 2017-10-22 DIAGNOSIS — Z7982 Long term (current) use of aspirin: Secondary | ICD-10-CM | POA: Insufficient documentation

## 2017-10-22 DIAGNOSIS — R4020 Unspecified coma: Secondary | ICD-10-CM | POA: Diagnosis not present

## 2017-10-22 DIAGNOSIS — R410 Disorientation, unspecified: Secondary | ICD-10-CM | POA: Diagnosis not present

## 2017-10-22 LAB — CBC WITH DIFFERENTIAL/PLATELET
Basophils Absolute: 0 10*3/uL (ref 0.0–0.1)
Basophils Relative: 0 %
Eosinophils Absolute: 0.1 10*3/uL (ref 0.0–0.7)
Eosinophils Relative: 1 %
HCT: 40.2 % (ref 36.0–46.0)
Hemoglobin: 13.3 g/dL (ref 12.0–15.0)
Lymphocytes Relative: 18 %
Lymphs Abs: 2.2 10*3/uL (ref 0.7–4.0)
MCH: 30.4 pg (ref 26.0–34.0)
MCHC: 33.1 g/dL (ref 30.0–36.0)
MCV: 92 fL (ref 78.0–100.0)
Monocytes Absolute: 0.9 10*3/uL (ref 0.1–1.0)
Monocytes Relative: 8 %
Neutro Abs: 8.5 10*3/uL — ABNORMAL HIGH (ref 1.7–7.7)
Neutrophils Relative %: 73 %
Platelets: 244 10*3/uL (ref 150–400)
RBC: 4.37 MIL/uL (ref 3.87–5.11)
RDW: 12.8 % (ref 11.5–15.5)
WBC: 11.7 10*3/uL — ABNORMAL HIGH (ref 4.0–10.5)

## 2017-10-22 LAB — COMPREHENSIVE METABOLIC PANEL
ALT: 20 U/L (ref 0–44)
AST: 23 U/L (ref 15–41)
Albumin: 4.2 g/dL (ref 3.5–5.0)
Alkaline Phosphatase: 62 U/L (ref 38–126)
Anion gap: 8 (ref 5–15)
BUN: 15 mg/dL (ref 8–23)
CO2: 26 mmol/L (ref 22–32)
Calcium: 8.8 mg/dL — ABNORMAL LOW (ref 8.9–10.3)
Chloride: 107 mmol/L (ref 98–111)
Creatinine, Ser: 0.96 mg/dL (ref 0.44–1.00)
GFR calc Af Amer: >60 mL/min (ref >60)
GFR calc non Af Amer: >60 mL/min (ref >60)
Glucose, Bld: 104 mg/dL — ABNORMAL HIGH (ref 70–99)
Potassium: 3.6 mmol/L (ref 3.5–5.1)
Sodium: 141 mmol/L (ref 135–145)
Total Bilirubin: 0.5 mg/dL (ref 0.3–1.2)
Total Protein: 6.7 g/dL (ref 6.5–8.1)

## 2017-10-22 LAB — URINALYSIS, ROUTINE W REFLEX MICROSCOPIC
Bilirubin Urine: NEGATIVE
Glucose, UA: NEGATIVE mg/dL
Hgb urine dipstick: NEGATIVE
Ketones, ur: NEGATIVE mg/dL
Leukocytes, UA: NEGATIVE
Nitrite: NEGATIVE
Protein, ur: NEGATIVE mg/dL
Specific Gravity, Urine: 1.008 (ref 1.005–1.030)
pH: 8 (ref 5.0–8.0)

## 2017-10-22 LAB — I-STAT CG4 LACTIC ACID, ED: Lactic Acid, Venous: 1.61 mmol/L (ref 0.5–1.9)

## 2017-10-22 MED ORDER — SODIUM CHLORIDE 0.9 % IV BOLUS (SEPSIS): 500.0000 mL | Freq: Once | INTRAVENOUS | Status: DC

## 2017-10-22 MED ORDER — VANCOMYCIN HCL IN DEXTROSE 1-5 GM/200ML-% IV SOLN
1000.0000 mg | Freq: Once | INTRAVENOUS | Status: AC
  Administered 2017-10-22: 1000 mg via INTRAVENOUS
  Filled 2017-10-22: qty 200

## 2017-10-22 MED ORDER — ACETAMINOPHEN 650 MG RE SUPP
650.0000 mg | Freq: Once | RECTAL | Status: AC
  Administered 2017-10-22: 650 mg via RECTAL
  Filled 2017-10-22: qty 1

## 2017-10-22 MED ORDER — SODIUM CHLORIDE 0.9 % IV BOLUS (SEPSIS)
1000.0000 mL | Freq: Once | INTRAVENOUS | Status: AC
  Administered 2017-10-22: 1000 mL via INTRAVENOUS

## 2017-10-22 MED ORDER — PIPERACILLIN-TAZOBACTAM 3.375 G IVPB 30 MIN
3.3750 g | Freq: Once | INTRAVENOUS | Status: AC
  Administered 2017-10-22: 3.375 g via INTRAVENOUS
  Filled 2017-10-22: qty 50

## 2017-10-22 NOTE — ED Provider Notes (Signed)
[facility] EMERGENCY DEPARTMENT Provider Note   CSN: [id number] Arrival date & time: 10/22/17  9509     History   Chief Complaint Chief Complaint  Patient presents with  . Altered Mental Status   Level 5 caveat due to altered mental status  HPI Jamie [name] is a 67 y.o. female.  The history is provided by the patient and the EMS personnel. The history is limited by the condition of the patient.  Altered Mental Status   This is a new problem. Episode onset: unknown. The problem has been gradually worsening. Associated symptoms include confusion.  Presents from home for altered mental status.  Per EMS, patient was stumbling around the house and appeared confused.  There is also concern for UTI. Patient reports she falls frequently.  No other details known on arrival.  There is no family present  Past Medical History:  Diagnosis Date  . Brain cancer ([place])   . Cancer ([place])   . CKD (chronic kidney disease), stage III ([place])   . High cholesterol   . Insomnia   . Lung cancer ([place])   . Osteoporosis     Patient Active Problem List   Diagnosis Date Noted  . Memory loss 02/16/2017  . History of therapeutic radiation 08/03/2016  . Osteoporosis 10/09/2014  . At high risk for falls 10/09/2014  . Hx of cancer of lung 08/01/2014  . Hx of brain cancer 08/01/2014  . Personal history of colonic polyps - adenomas 07/18/2013  . Syncope 03/22/2011  . Hypotension 03/22/2011  . Memory impairment 03/22/2011  . History of cancer 03/22/2011  . Hyperlipidemia 03/22/2011    Past Surgical History:  Procedure Laterality Date  . ABDOMINAL HYSTERECTOMY  1986  . BRAIN SURGERY  2001   TUMOR REMOVAL  . catarac Bilateral   . EYE SURGERY    . LOBECTOMY       OB History    Gravida  2   Para  2   Term  2   Preterm      AB      Living        SAB      TAB      Ectopic      Multiple      Live Births               Home Medications    Prior to Admission medications     Medication Sig Start Date End Date Taking? Authorizing Provider  aspirin EC 81 MG tablet Take 81 mg by mouth daily.      [provider]  clotrimazole-betamethasone (LOTRISONE) cream Apply 1 application topically 2 (two) times daily. 08/03/16   Terald Sleeper, PA-C  diazepam (VALIUM) 10 MG tablet TAKE 1 TABLET AT BEDTIME AS NEEDED FOR ANXIETY 10/07/17   Terald Sleeper, PA-C  folic acid (FOLVITE) 1 MG tablet Take 1 tablet (1 mg total) by mouth daily. 02/16/17   Terald Sleeper, PA-C  ibuprofen (ADVIL,MOTRIN) 800 MG tablet Take 1 tablet (800 mg total) by mouth every 8 (eight) hours as needed. 02/16/17   Terald Sleeper, PA-C  Multiple Vitamins-Minerals (ALIVE WOMENS ENERGY PO) Take 1 tablet by mouth daily.    [provider]  rosuvastatin (CRESTOR) 10 MG tablet TAKE ONE TABLET AT BEDTIME 10/07/17   Terald Sleeper, PA-C  valACYclovir (VALTREX) 1000 MG tablet Take 2 tabs at onset, then 2 tabs 1 hour later 08/03/16   Terald Sleeper, PA-C  Family History Family History  Problem Relation Age of Onset  . Cancer Mother        cirrhois of liver  . Heart attack Father   . Colon cancer Father   . Asthma Brother     Social History Social History   Tobacco Use  . Smoking status: Former Smoker    Years: 33.00    Types: Cigarettes    Last attempt to quit: 04/20/1999    Years since quitting: 18.5  . Smokeless tobacco: Never Used  Substance Use Topics  . Alcohol use: Yes    Alcohol/week: 1.8 oz    Types: 3 Cans of beer per week    Comment: occassional Saturday night 3 dark beer.  . Drug use: No     Allergies   Fosamax [alendronate sodium] and Sulfonamide derivatives   Review of Systems Review of Systems  Unable to perform ROS: Mental status change  Psychiatric/Behavioral: Positive for confusion.     Physical Exam Updated Vital Signs BP 135/71   Pulse 78   Temp (!) 100.5 F (38.1 C) (Rectal)   Resp 15   Ht 1.702 m (5\' 7" )   Wt 79.4 kg (175 lb)   SpO2 94%   BMI  27.41 kg/m   Physical Exam CONSTITUTIONAL: Elderly, no acute distress HEAD: Normocephalic/atraumatic EYES: EOMI/PERRL ENMT: Mucous membranes moist NECK: supple no meningeal signs SPINE/BACK:entire spine nontender CV: S1/S2 noted, no murmurs/rubs/gallops noted LUNGS: Lungs are clear to auscultation bilaterally, no apparent distress ABDOMEN: soft, nontender GU:no cva tenderness NEURO: Pt is awake/alert appears confused but follows all commands, moves all extremitiesx4.  No facial droop.   EXTREMITIES: pulses normal/equal, full ROM, pelvis stable, full range motion both hips without difficulty, all other extremities/joints palpated/ranged and nontender SKIN: warm, color normal   ED Treatments / Results  Labs (all labs ordered are listed, but only abnormal results are displayed) Labs Reviewed  COMPREHENSIVE METABOLIC PANEL - Abnormal; Notable for the following components:      Result Value   Glucose, Bld 104 (*)    Calcium 8.8 (*)    All other components within normal limits  CBC WITH DIFFERENTIAL/PLATELET - Abnormal; Notable for the following components:   WBC 11.7 (*)    Neutro Abs 8.5 (*)    All other components within normal limits  URINALYSIS, ROUTINE W REFLEX MICROSCOPIC - Abnormal; Notable for the following components:   APPearance HAZY (*)    All other components within normal limits  CULTURE, BLOOD (ROUTINE X 2)  CULTURE, BLOOD (ROUTINE X 2)  URINE CULTURE  I-STAT CG4 LACTIC ACID, ED  I-STAT CG4 LACTIC ACID, ED    EKG EKG Interpretation  Date/Time:  Saturday October 22 2017 03:17:47 EDT Ventricular Rate:  83 PR Interval:    QRS Duration: 95 QT Interval:  380 QTC Calculation: 447 R Axis:   72 Text Interpretation:  Sinus rhythm Low voltage, extremity and precordial leads Confirmed by Ripley Fraise [phone]) on 10/22/2017 3:34:59 AM   Radiology Dg Chest 1 View  Result Date: 10/22/2017 CLINICAL DATA:  Fever.  Altered mental status.  Foul smelling urine. EXAM:  CHEST  1 VIEW COMPARISON:  09/13/2012 FINDINGS: Shallow inspiration. Heart size and pulmonary vascularity are normal. Lungs appear clear and expanded. No blunting of costophrenic angles. No pneumothorax. Calcification of the aorta. Fracture deformity of the proximal right humerus, better seen on previous right shoulder examination from 08/01/2017. IMPRESSION: Shallow inspiration.  No evidence of active pulmonary disease. Electronically Signed   By: Gwyndolyn Saxon  Gerilyn Nestle M.D.   On: 10/22/2017 04:07   Ct Head Wo Contrast  Result Date: 10/22/2017 CLINICAL DATA:  Altered level of consciousness. Patient was stumbling around with foul smelling urine. EXAM: CT HEAD WITHOUT CONTRAST TECHNIQUE: Contiguous axial images were obtained from the base of the skull through the vertex without intravenous contrast. COMPARISON:  MRI brain 03/22/2011.  CT head 03/21/2011. FINDINGS: Brain: Diffuse cerebral atrophy. Ventricular dilatation consistent with central atrophy. Low-attenuation changes in the deep white matter consistent small vessel ischemia. Encephalomalacia in the left anterior frontal lobe consistent with old postoperative change. No change since prior study. No mass-effect or midline shift. No abnormal extra-axial fluid collections. Basal cisterns are not effaced. No acute intracranial hemorrhage. Vascular: No hyperdense vessel or unexpected calcification. Skull: Postoperative changes with left frontal craniotomy. Diffuse bone demineralization likely representing metabolic change or osteoporosis. No acute depressed skull fractures. Sinuses/Orbits: Paranasal sinuses and mastoid air cells are clear. Other: None. IMPRESSION: 1. No acute intracranial abnormalities. Chronic atrophy and small vessel ischemic changes. 2. Postoperative left frontal craniotomy with underlying encephalomalacia. No change since prior study. Electronically Signed   By: Lucienne Capers M.D.   On: 10/22/2017 04:59    Procedures Procedures    Medications Ordered in ED Medications  sodium chloride 0.9 % bolus 1,000 mL (0 mLs Intravenous Stopped 10/22/17 0524)    And  sodium chloride 0.9 % bolus 1,000 mL (0 mLs Intravenous Stopped 10/22/17 0524)    And  sodium chloride 0.9 % bolus 500 mL (500 mLs Intravenous Not Given 10/22/17 0525)  piperacillin-tazobactam (ZOSYN) IVPB 3.375 g (0 g Intravenous Stopped 10/22/17 0434)  vancomycin (VANCOCIN) IVPB 1000 mg/200 mL premix (0 mg Intravenous Stopped 10/22/17 0524)  acetaminophen (TYLENOL) suppository 650 mg (650 mg Rectal Given 10/22/17 0405)     Initial Impression / Assessment and Plan / ED Course  I have reviewed the triage vital signs and the nursing notes.  Pertinent labs & imaging results that were available during my care of the patient were reviewed by me and considered in my medical decision making (see chart for details).     5:31 AM Brought to emergency department for confusion and fever.  Few details are known on arrival, but she did have a temperature of 100.5.  Code sepsis was called, but all of her work-up has been unremarkable. She is awake and alert in no distress.  She does report that she falls frequently with balance issues but this is not a new phenomenon She does not appear confused at this time.  There is no meningeal signs, denies headache or neck pain. Will call family for further information 5:35 AM Spoke With son.  He reports that she needs a home health nurse.  She did appear more confused tonight, has had difficulty walking. He has no other concerns at this time.  Will order home health. 7:03 AM Ambulatory, no acute distress.  She denies any complaints.  Vitals are appropriate. BP 121/75   Pulse 77   Temp 97.6 F (36.4 C) (Oral)   Resp 13   Ht 1.702 m (5\' 7" )   Wt 79.4 kg (175 lb)   SpO2 99%   BMI 27.41 kg/m  She is not septic appearing.  No signs of acute stroke.  Final Clinical Impressions(s) / ED Diagnoses   Final diagnoses:  Transient alteration  of awareness    ED Discharge Orders    None       Ripley Fraise, MD 10/22/17 [phone]

## 2017-10-22 NOTE — Discharge Instructions (Signed)
You should be contacted by home health next week. If there are any new falls, any new weakness, or any worsening confusion please return to the ER immediately

## 2017-10-22 NOTE — ED Provider Notes (Signed)
Addendum Pt had no signs of CVA/TIA No focal weakness She ambulated History from family not supportive of TIA/CVA syndrome    Ripley Fraise, MD 10/22/17 [phone]

## 2017-10-22 NOTE — ED Triage Notes (Signed)
Per family ams and stumbling around, and foul smelling urine

## 2017-10-22 NOTE — ED Notes (Signed)
Patient ambulated in hall with rolling walker tolerated well. Pt to be discharged home. Notified son Legrand Como for pickup.

## 2017-10-23 LAB — BLOOD CULTURE ID PANEL (REFLEXED)

## 2017-10-23 LAB — URINE CULTURE: Culture: NO GROWTH

## 2017-10-23 NOTE — ED Notes (Signed)
Date and time results received: 10/23/17 @01 :32 Test: blood culture Critical Value: gram +cocci in anerobic blood culture bottle   Name of Provider Notified: Dr Thurnell Garbe  Orders Received? Or Actions Taken?: to contact pt in am for follow up on how she is doing,

## 2017-10-24 ENCOUNTER — Ambulatory Visit (INDEPENDENT_AMBULATORY_CARE_PROVIDER_SITE_OTHER): Payer: Medicare Other | Admitting: Physician Assistant

## 2017-10-24 ENCOUNTER — Encounter: Payer: Self-pay | Admitting: Physician Assistant

## 2017-10-24 VITALS — BP 125/77 | HR 80 | Temp 97.7°F | Ht 67.0 in | Wt 169.8 lb

## 2017-10-24 DIAGNOSIS — M5126 Other intervertebral disc displacement, lumbar region: Secondary | ICD-10-CM | POA: Diagnosis not present

## 2017-10-24 DIAGNOSIS — Z9181 History of falling: Secondary | ICD-10-CM | POA: Diagnosis not present

## 2017-10-24 DIAGNOSIS — Z923 Personal history of irradiation: Secondary | ICD-10-CM | POA: Diagnosis not present

## 2017-10-24 DIAGNOSIS — R413 Other amnesia: Secondary | ICD-10-CM | POA: Diagnosis not present

## 2017-10-24 NOTE — Progress Notes (Signed)
BP 125/77   Pulse 80   Temp 97.7 F (36.5 C) (Oral)   Ht 5\' 7"  (1.702 m)   Wt 169 lb 12.8 oz (77 kg)   SpO2 98%   BMI 26.59 kg/m    Subjective:    Patient ID: Jamie [name], female    DOB: 19-Jun-1950, 67 y.o.   MRN: [id number]  HPI: Jamie [name] is a 67 y.o. female presenting on 10/24/2017 for Hyperlipidemia (6 month follow up )  FACE to [facility]  This patient is brought in today for multiple medical conditions.  She has had a significant decline in her overall cognition and physical mobility.  She has a known herniated disc that is pressing on her left nerve.  She does have weakness in this leg and I am sure is 1 of the main reason she is having such frequent falls.  MRI has confirmed the disc problem.  Patient also is status post brain tumor removal and radiation.  She has had cognitive changes ever since the procedure and has gotten suddenly worse.  Her neurologist reports that there will definitely be permanent decline and nothing will really bring her back.  She is trying some Aricept with some improvement in her sentences and words however during the visit today she has very little ability to answer any of my questions directly.  She appears fairly comfortable.  There is a lot of concern about several people living in her home and they are not being much room for her to be mobile I do feel that she needs to be in a wheelchair as much as possible and actually when she goes on outings.  She does need improved safety in the home with a bedside commode and or one that is over the toilet for her to have ability.  We will put a referral into home health.  This will serve as her face-to-face encounter for home health services.  Over the weekend she had another fall and was taken to [facility].  Due to the fact there was a very limited history and no one from her family was there to do a report they did a sepsis work-up.  She had extensive testing performed but nothing did show to be  very problematic.  [name] is a friend that does care.  She does not live with the patient.  Past Medical History:  Diagnosis Date  . Brain cancer ([place])   . Cancer ([place])   . CKD (chronic kidney disease), stage III ([place])   . High cholesterol   . Insomnia   . Lung cancer ([place])   . Osteoporosis    Relevant past medical, surgical, family and social history reviewed and updated as indicated. Interim medical history since our last visit reviewed. Allergies and medications reviewed and updated. DATA REVIEWED: CHART IN EPIC  Family History reviewed for pertinent findings.  Review of Systems  Constitutional: Negative.  Negative for activity change, fatigue, fever and unexpected weight change.  HENT: Negative.   Eyes: Negative.   Respiratory: Negative.  Negative for cough.   Cardiovascular: Negative.  Negative for chest pain.  Gastrointestinal: Negative.  Negative for abdominal pain.  Endocrine: Negative.   Genitourinary: Negative.  Negative for dysuria.  Musculoskeletal: Positive for arthralgias, back pain, gait problem and joint swelling.  Skin: Negative.   Neurological: Positive for speech difficulty and weakness. Negative for facial asymmetry.    Allergies as of 10/24/2017      Reactions  Fosamax [alendronate Sodium] Other (See Comments)   "unknown"   Sulfonamide Derivatives    REACTION: Hives, tongue swells      Medication List        Accurate as of 10/24/17 12:00 PM. Always use your most recent med list.          ALIVE WOMENS ENERGY PO Take 1 tablet by mouth daily.   aspirin EC 81 MG tablet Take 81 mg by mouth daily.   clotrimazole-betamethasone cream Commonly known as:  LOTRISONE Apply 1 application topically 2 (two) times daily.   diazepam 10 MG tablet Commonly known as:  VALIUM TAKE 1 TABLET AT BEDTIME AS NEEDED FOR ANXIETY   donepezil 5 MG tablet Commonly known as:  ARICEPT Take by mouth.   folic acid 1 MG tablet Commonly known as:  FOLVITE Take 1  tablet (1 mg total) by mouth daily.   ibuprofen 800 MG tablet Commonly known as:  ADVIL,MOTRIN Take 1 tablet (800 mg total) by mouth every 8 (eight) hours as needed.   MAGNESIUM PO Take by mouth.   rosuvastatin 10 MG tablet Commonly known as:  CRESTOR TAKE ONE TABLET AT BEDTIME   valACYclovir 1000 MG tablet Commonly known as:  VALTREX Take 2 tabs at onset, then 2 tabs 1 hour later            Durable Medical Equipment  (From admission, onward)        Start     Ordered   10/24/17 0000  DME Bedside commode    Question Answer Comment  Patient needs a bedside commode to treat with the following condition At high risk for injury related to fall   Patient needs a bedside commode to treat with the following condition Lumbar herniated disc      10/24/17 1109   10/24/17 0000  DME Wheelchair manual    Comments:  Patient suffers from Herniated lumbar disc with left leg neuropathy, high risk falls, and dementia  which impairs their ability to perform daily activities like bathing and dressing in the home.  A walker, crutch or cane will not resolve issue with performing activities of daily living. A wheelchair will allow patient to safely perform daily activities. Patient can safely propel the wheelchair in the home or has a caregiver who can provide assistance. Accessories: elevating leg rests (ELRs), wheel locks, extensions and anti-tippers.   10/24/17 1141         Objective:    BP 125/77   Pulse 80   Temp 97.7 F (36.5 C) (Oral)   Ht 5\' 7"  (1.702 m)   Wt 169 lb 12.8 oz (77 kg)   SpO2 98%   BMI 26.59 kg/m   Allergies  Allergen Reactions  . Fosamax [Alendronate Sodium] Other (See Comments)    "unknown"  . Sulfonamide Derivatives     REACTION: Hives, tongue swells    Wt Readings from Last 3 Encounters:  10/24/17 169 lb 12.8 oz (77 kg)  10/22/17 175 lb (79.4 kg)  08/01/17 174 lb (78.9 kg)    Physical Exam  Constitutional: She appears well-developed and  well-nourished. No distress.  HENT:  Head: Normocephalic and atraumatic.  Eyes: Pupils are equal, round, and reactive to light. Conjunctivae and EOM are normal.  Cardiovascular: Normal rate, regular rhythm, normal heart sounds and intact distal pulses.  Pulmonary/Chest: Effort normal and breath sounds normal.  Abdominal: Soft. Bowel sounds are normal.  Neurological: She is alert. She has normal reflexes. No cranial nerve deficit.  She is unable to directly answer any of my questions appropriately.  She is only using very few words when she is verbal. During the visit she is wheelchair-bound  Skin: Skin is warm and dry. No rash noted.  Psychiatric: She has a normal mood and affect. Her behavior is normal. Judgment and thought content normal.    Results for orders placed or performed during the hospital encounter of 10/22/17  Blood Culture (routine x 2)  Result Value Ref Range   Specimen Description      RIGHT ANTECUBITAL Performed at [facility], [address], [place], [place] [address]    Special Requests      BOTTLES DRAWN AEROBIC AND ANAEROBIC Blood Culture adequate volume Performed at [facility], [address] [place], [place] [address]    Culture  Setup Time      GRAM POSITIVE COCCI Gram Stain Report Called to,Read Back By and Verified With: PRUITT,G. AT 0059 ON 10/23/2017 BY EVA ANAEROBIC BOTTLE ONLY Performed at [place] TO, READ BACK BY AND VERIFIED WITH: G.Bernadette Hoit [id number] 10/23/17 M.CAMPBELL Performed at [facility], [place] [address] [place], [place] [address]    Culture GRAM POSITIVE COCCI    Report Status PENDING   Blood Culture (routine x 2)  Result Value Ref Range   Specimen Description BLOOD LEFT HAND    Special Requests      BOTTLES DRAWN AEROBIC ONLY Blood Culture adequate volume   Culture      NO GROWTH 2 DAYS Performed at [facility], [address] [place], [place] [address]      Report Status PENDING   Urine culture  Result Value Ref Range   Specimen Description      URINE, CATHETERIZED Performed at [facility], [address], [place], [place] [address]    Special Requests      NONE Performed at [facility], [address] [place], [place] [address]    Culture      NO GROWTH Performed at [facility], [place] [address], [place], [place] [address]    Report Status 10/23/2017 FINAL   Blood Culture ID Panel (Reflexed)  Result Value Ref Range   Enterococcus species NOT DETECTED NOT DETECTED   Listeria monocytogenes NOT DETECTED NOT DETECTED   Staphylococcus species NOT DETECTED NOT DETECTED   Staphylococcus aureus NOT DETECTED NOT DETECTED   Streptococcus species DETECTED (A) NOT DETECTED   Streptococcus agalactiae NOT DETECTED NOT DETECTED   Streptococcus pneumoniae NOT DETECTED NOT DETECTED   Streptococcus pyogenes NOT DETECTED NOT DETECTED   Acinetobacter baumannii NOT DETECTED NOT DETECTED   Enterobacteriaceae species NOT DETECTED NOT DETECTED   Enterobacter cloacae complex NOT DETECTED NOT DETECTED   Escherichia coli NOT DETECTED NOT DETECTED   Klebsiella oxytoca NOT DETECTED NOT DETECTED   Klebsiella pneumoniae NOT DETECTED NOT DETECTED   Proteus species NOT DETECTED NOT DETECTED   Serratia marcescens NOT DETECTED NOT DETECTED   Haemophilus influenzae NOT DETECTED NOT DETECTED   Neisseria meningitidis NOT DETECTED NOT DETECTED   Pseudomonas aeruginosa NOT DETECTED NOT DETECTED   Candida albicans NOT DETECTED NOT DETECTED   Candida glabrata NOT DETECTED NOT DETECTED   Candida krusei NOT DETECTED NOT DETECTED   Candida parapsilosis NOT DETECTED NOT DETECTED   Candida tropicalis NOT DETECTED NOT DETECTED  Comprehensive metabolic panel  Result Value Ref Range   Sodium 141 135 - 145 mmol/L   Potassium 3.6 3.5 -  5.1 mmol/L   Chloride 107 98 - 111 mmol/L   CO2 26 22 - 32 mmol/L   Glucose, Bld 104 (H) 70 - 99 mg/dL   BUN 15 8 - 23 mg/dL    Creatinine, Ser 0.96 0.44 - 1.00 mg/dL   Calcium 8.8 (L) 8.9 - 10.3 mg/dL   Total Protein 6.7 6.5 - 8.1 g/dL   Albumin 4.2 3.5 - 5.0 g/dL   AST 23 15 - 41 U/L   ALT 20 0 - 44 U/L   Alkaline Phosphatase 62 38 - 126 U/L   Total Bilirubin 0.5 0.3 - 1.2 mg/dL   GFR calc non Af Amer >60 >60 mL/min   GFR calc Af Amer >60 >60 mL/min   Anion gap 8 5 - 15  CBC WITH DIFFERENTIAL  Result Value Ref Range   WBC 11.7 (H) 4.0 - 10.5 K/uL   RBC 4.37 3.87 - 5.11 MIL/uL   Hemoglobin 13.3 12.0 - 15.0 g/dL   HCT 40.2 36.0 - 46.0 %   MCV 92.0 78.0 - 100.0 fL   MCH 30.4 26.0 - 34.0 pg   MCHC 33.1 30.0 - 36.0 g/dL   RDW 12.8 11.5 - 15.5 %   Platelets 244 150 - 400 K/uL   Neutrophils Relative % 73 %   Neutro Abs 8.5 (H) 1.7 - 7.7 K/uL   Lymphocytes Relative 18 %   Lymphs Abs 2.2 0.7 - 4.0 K/uL   Monocytes Relative 8 %   Monocytes Absolute 0.9 0.1 - 1.0 K/uL   Eosinophils Relative 1 %   Eosinophils Absolute 0.1 0.0 - 0.7 K/uL   Basophils Relative 0 %   Basophils Absolute 0.0 0.0 - 0.1 K/uL  Urinalysis, Routine w reflex microscopic  Result Value Ref Range   Color, Urine YELLOW YELLOW   APPearance HAZY (A) CLEAR   Specific Gravity, Urine 1.008 1.005 - 1.030   pH 8.0 5.0 - 8.0   Glucose, UA NEGATIVE NEGATIVE mg/dL   Hgb urine dipstick NEGATIVE NEGATIVE   Bilirubin Urine NEGATIVE NEGATIVE   Ketones, ur NEGATIVE NEGATIVE mg/dL   Protein, ur NEGATIVE NEGATIVE mg/dL   Nitrite NEGATIVE NEGATIVE   Leukocytes, UA NEGATIVE NEGATIVE  I-Stat CG4 Lactic Acid, ED  (not at  [facility])  Result Value Ref Range   Lactic Acid, Venous 1.61 0.5 - 1.9 mmol/L      Assessment & Plan:   1. Herniated intervertebral disc of lumbar spine - DME Bedside commode - Home Health - Face-to-face encounter (required for Medicare/Medicaid patients) - DME Wheelchair manual  2. At high risk for falls - DME Bedside commode - Home Health - Face-to-face encounter (required for Medicare/Medicaid patients) - DME Wheelchair  manual  3. Memory impairment - donepezil (ARICEPT) 5 MG tablet; Take by mouth. - Home Health - Face-to-face encounter (required for Medicare/Medicaid patients) - DME Wheelchair manual  4. History of therapeutic radiation - Home Health - Face-to-face encounter (required for Medicare/Medicaid patients)  CONTACT INFO: [name] [phone]   Continue all other maintenance medications as listed above.  Follow up plan: Return in about 1 month (around 11/21/2017) for recheck.  Educational handout given for [place] PA-C [place] [address]  [place], [place] [address] [phone]   10/24/2017, 12:00 PM

## 2017-10-24 NOTE — Patient Instructions (Signed)
In a few days you may receive a survey in the mail or online from [employer] regarding your visit with us today. Please take a moment to fill this out. Your feedback is very important to our whole office. It can help us better understand your needs as well as improve your experience and satisfaction. Thank you for taking your time to complete it. We care about you.  Cletus Paris, PA-C  

## 2017-10-25 LAB — CULTURE, BLOOD (ROUTINE X 2): Special Requests: ADEQUATE

## 2017-10-26 ENCOUNTER — Telehealth: Payer: Self-pay | Admitting: Emergency Medicine

## 2017-10-26 NOTE — Progress Notes (Signed)
ED Antimicrobial Stewardship Positive Culture Follow Up   Jamie [name] is an 67 y.o. female who presented to [facility] on 10/22/2017 with a chief complaint of  Chief Complaint  Patient presents with  . Altered Mental Status    Recent Results (from the past 720 hour(s))  Blood Culture (routine x 2)     Status: Abnormal   Collection Time: 10/22/17  3:58 AM  Result Value Ref Range Status   Specimen Description   Final    RIGHT ANTECUBITAL Performed at [facility], [address], [place], [place] [address]    Special Requests   Final    BOTTLES DRAWN AEROBIC AND ANAEROBIC Blood Culture adequate volume Performed at [facility], [address] [place], [place] [address]    Culture  Setup Time   Final    GRAM POSITIVE COCCI Gram Stain Report Called to,Read Back By and Verified With: PRUITT,G. AT 0059 ON 10/23/2017 BY EVA ANAEROBIC BOTTLE ONLY Performed at [place] WITH RESULT CRITICAL RESULT CALLED TO, READ BACK BY AND VERIFIED WITH: G.Bernadette Hoit [id number] 10/23/17 M.CAMPBELL    Culture (A)  Final    VIRIDANS STREPTOCOCCUS THE SIGNIFICANCE OF ISOLATING THIS ORGANISM FROM A SINGLE SET OF BLOOD CULTURES WHEN MULTIPLE SETS ARE DRAWN IS UNCERTAIN. PLEASE NOTIFY THE MICROBIOLOGY DEPARTMENT WITHIN ONE WEEK IF SPECIATION AND SENSITIVITIES ARE REQUIRED. Performed at [facility], [place] [address] [place], [place] [address]    Report Status 10/25/2017 FINAL  Final  Blood Culture ID Panel (Reflexed)     Status: Abnormal   Collection Time: 10/22/17  3:58 AM  Result Value Ref Range Status   Enterococcus species NOT DETECTED NOT DETECTED Final   Listeria monocytogenes NOT DETECTED NOT DETECTED Final   Staphylococcus species NOT DETECTED NOT DETECTED Final   Staphylococcus aureus NOT DETECTED NOT DETECTED Final   Streptococcus species DETECTED (A) NOT DETECTED Final    Comment: Not Enterococcus species, Streptococcus agalactiae, Streptococcus pyogenes, or  Streptococcus pneumoniae. CRITICAL RESULT CALLED TO, READ BACK BY AND VERIFIED WITH: G.Bernadette Hoit [id number] 10/23/17 M.CAMPBELL    Streptococcus agalactiae NOT DETECTED NOT DETECTED Final   Streptococcus pneumoniae NOT DETECTED NOT DETECTED Final   Streptococcus pyogenes NOT DETECTED NOT DETECTED Final   Acinetobacter baumannii NOT DETECTED NOT DETECTED Final   Enterobacteriaceae species NOT DETECTED NOT DETECTED Final   Enterobacter cloacae complex NOT DETECTED NOT DETECTED Final   Escherichia coli NOT DETECTED NOT DETECTED Final   Klebsiella oxytoca NOT DETECTED NOT DETECTED Final   Klebsiella pneumoniae NOT DETECTED NOT DETECTED Final   Proteus species NOT DETECTED NOT DETECTED Final   Serratia marcescens NOT DETECTED NOT DETECTED Final   Haemophilus influenzae NOT DETECTED NOT DETECTED Final   Neisseria meningitidis NOT DETECTED NOT DETECTED Final   Pseudomonas aeruginosa NOT DETECTED NOT DETECTED Final   Candida albicans NOT DETECTED NOT DETECTED Final   Candida glabrata NOT DETECTED NOT DETECTED Final   Candida krusei NOT DETECTED NOT DETECTED Final   Candida parapsilosis NOT DETECTED NOT DETECTED Final   Candida tropicalis NOT DETECTED NOT DETECTED Final    Comment: Performed at [facility], [place] [address] [place], [place] [address]  Blood Culture (routine x 2)     Status: None (Preliminary result)   Collection Time: 10/22/17  4:03 AM  Result Value Ref Range Status   Specimen Description BLOOD LEFT HAND  Final   Special Requests   Final    BOTTLES DRAWN AEROBIC ONLY  Blood Culture adequate volume   Culture   Final    NO GROWTH 4 DAYS Performed at [facility], [address], [place], [place] [address]    Report Status PENDING  Incomplete  Urine culture     Status: None   Collection Time: 10/22/17  4:20 AM  Result Value Ref Range Status   Specimen Description   Final    URINE, CATHETERIZED Performed at [facility], [address] [place], [place] [address]    Special  Requests   Final    NONE Performed at [facility], [address], [place], [place] [address]    Culture   Final    NO GROWTH Performed at [facility], [place] [address], [place], [place] [address]    Report Status 10/23/2017 FINAL  Final    []  Treated with N/A, organism resistant to prescribed antimicrobial [x]  Patient discharged originally without antimicrobial agent and treatment is now indicated  New antibiotic prescription: If pt remains febrile or is feeling poorly, she should return to the hospital  ED Provider: Coral Ceo, [place], Rande Lawman 10/26/2017, 10:03 AM Clinical Pharmacist Monday - Friday phone -  [phone] Saturday - Sunday phone - [phone]

## 2017-10-26 NOTE — Telephone Encounter (Signed)
Post ED Visit - Positive Culture Follow-up: Successful Patient Follow-Up  Culture assessed and recommendations reviewed by:  []  Elenor Quinones, Pharm.D. []  Heide Guile, Pharm.D., BCPS AQ-ID []  Parks Neptune, Pharm.D., BCPS []  Alycia Rossetti, Pharm.D., BCPS []  [place], Pharm.D., BCPS, AAHIVP []  Legrand Como, Pharm.D., BCPS, AAHIVP [x]  Salome Arnt, PharmD, BCPS []  Johnnette Gourd, PharmD, BCPS []  [place] Better, PharmD, BCPS []  Leeroy Cha, PharmD  Positive blood culture  []  Patient discharged without antimicrobial prescription and treatment is now indicated []  Organism is resistant to prescribed ED discharge antimicrobial [x]  Patient with positive blood cultures  Changes discussed with ED provider: Coral Ceo PA New antibiotic prescription  symptom check, if fever or feeling poorly needs to return to ED  Symptoms have resolved   Hazle Nordmann 10/26/2017, 1:02 PM

## 2017-10-27 LAB — CULTURE, BLOOD (ROUTINE X 2)
Culture: NO GROWTH
Special Requests: ADEQUATE

## 2017-10-30 ENCOUNTER — Other Ambulatory Visit: Payer: Self-pay

## 2017-10-30 ENCOUNTER — Emergency Department (HOSPITAL_COMMUNITY): Payer: Medicare Other

## 2017-10-30 ENCOUNTER — Encounter (HOSPITAL_COMMUNITY): Payer: Self-pay | Admitting: Emergency Medicine

## 2017-10-30 ENCOUNTER — Observation Stay (HOSPITAL_COMMUNITY)
Admission: EM | Admit: 2017-10-30 | Discharge: 2017-11-01 | Disposition: A | Discharge status: Home / Self Care | Payer: Medicare Other | Attending: Internal Medicine | Admitting: Internal Medicine

## 2017-10-30 DIAGNOSIS — E785 Hyperlipidemia, unspecified: Secondary | ICD-10-CM | POA: Diagnosis present

## 2017-10-30 DIAGNOSIS — Z888 Allergy status to other drugs, medicaments and biological substances status: Secondary | ICD-10-CM | POA: Diagnosis not present

## 2017-10-30 DIAGNOSIS — Z86010 Personal history of colonic polyps: Secondary | ICD-10-CM | POA: Insufficient documentation

## 2017-10-30 DIAGNOSIS — Z87891 Personal history of nicotine dependence: Secondary | ICD-10-CM | POA: Insufficient documentation

## 2017-10-30 DIAGNOSIS — Z8249 Family history of ischemic heart disease and other diseases of the circulatory system: Secondary | ICD-10-CM | POA: Insufficient documentation

## 2017-10-30 DIAGNOSIS — F0390 Unspecified dementia without behavioral disturbance: Secondary | ICD-10-CM | POA: Insufficient documentation

## 2017-10-30 DIAGNOSIS — Z800 Family history of malignant neoplasm of digestive organs: Secondary | ICD-10-CM | POA: Insufficient documentation

## 2017-10-30 DIAGNOSIS — E7800 Pure hypercholesterolemia, unspecified: Secondary | ICD-10-CM | POA: Insufficient documentation

## 2017-10-30 DIAGNOSIS — R413 Other amnesia: Secondary | ICD-10-CM | POA: Diagnosis present

## 2017-10-30 DIAGNOSIS — M5126 Other intervertebral disc displacement, lumbar region: Secondary | ICD-10-CM | POA: Diagnosis not present

## 2017-10-30 DIAGNOSIS — R4781 Slurred speech: Secondary | ICD-10-CM | POA: Diagnosis not present

## 2017-10-30 DIAGNOSIS — G4700 Insomnia, unspecified: Secondary | ICD-10-CM | POA: Insufficient documentation

## 2017-10-30 DIAGNOSIS — Z882 Allergy status to sulfonamides status: Secondary | ICD-10-CM | POA: Insufficient documentation

## 2017-10-30 DIAGNOSIS — Z923 Personal history of irradiation: Secondary | ICD-10-CM

## 2017-10-30 DIAGNOSIS — N183 Chronic kidney disease, stage 3 unspecified: Secondary | ICD-10-CM | POA: Diagnosis present

## 2017-10-30 DIAGNOSIS — Z85841 Personal history of malignant neoplasm of brain: Secondary | ICD-10-CM | POA: Insufficient documentation

## 2017-10-30 DIAGNOSIS — G9389 Other specified disorders of brain: Secondary | ICD-10-CM | POA: Insufficient documentation

## 2017-10-30 DIAGNOSIS — Z85118 Personal history of other malignant neoplasm of bronchus and lung: Secondary | ICD-10-CM

## 2017-10-30 DIAGNOSIS — R402441 Other coma, without documented Glasgow coma scale score, or with partial score reported, in the field [EMT or ambulance]: Secondary | ICD-10-CM | POA: Diagnosis not present

## 2017-10-30 DIAGNOSIS — Z7982 Long term (current) use of aspirin: Secondary | ICD-10-CM | POA: Diagnosis not present

## 2017-10-30 DIAGNOSIS — Z9221 Personal history of antineoplastic chemotherapy: Secondary | ICD-10-CM | POA: Insufficient documentation

## 2017-10-30 DIAGNOSIS — Z9889 Other specified postprocedural states: Secondary | ICD-10-CM | POA: Insufficient documentation

## 2017-10-30 DIAGNOSIS — Z902 Acquired absence of lung [part of]: Secondary | ICD-10-CM | POA: Diagnosis not present

## 2017-10-30 DIAGNOSIS — I959 Hypotension, unspecified: Secondary | ICD-10-CM | POA: Diagnosis not present

## 2017-10-30 DIAGNOSIS — R4182 Altered mental status, unspecified: Secondary | ICD-10-CM | POA: Diagnosis not present

## 2017-10-30 DIAGNOSIS — Z79899 Other long term (current) drug therapy: Secondary | ICD-10-CM | POA: Insufficient documentation

## 2017-10-30 DIAGNOSIS — G9340 Encephalopathy, unspecified: Secondary | ICD-10-CM | POA: Diagnosis present

## 2017-10-30 DIAGNOSIS — I6782 Cerebral ischemia: Secondary | ICD-10-CM | POA: Insufficient documentation

## 2017-10-30 DIAGNOSIS — G319 Degenerative disease of nervous system, unspecified: Secondary | ICD-10-CM | POA: Insufficient documentation

## 2017-10-30 DIAGNOSIS — G459 Transient cerebral ischemic attack, unspecified: Secondary | ICD-10-CM | POA: Diagnosis not present

## 2017-10-30 DIAGNOSIS — M810 Age-related osteoporosis without current pathological fracture: Secondary | ICD-10-CM | POA: Insufficient documentation

## 2017-10-30 DIAGNOSIS — Z90710 Acquired absence of both cervix and uterus: Secondary | ICD-10-CM | POA: Diagnosis not present

## 2017-10-30 DIAGNOSIS — R410 Disorientation, unspecified: Secondary | ICD-10-CM | POA: Diagnosis not present

## 2017-10-30 HISTORY — DX: Unspecified dementia, unspecified severity, without behavioral disturbance, psychotic disturbance, mood disturbance, and anxiety: F03.90

## 2017-10-30 LAB — ETHANOL: Alcohol, Ethyl (B): <10 mg/dL (ref <10)

## 2017-10-30 LAB — I-STAT CHEM 8, ED
BUN: 12 mg/dL (ref 8–23)
Calcium, Ion: 1.08 mmol/L — ABNORMAL LOW (ref 1.15–1.40)
Chloride: 102 mmol/L (ref 98–111)
Creatinine, Ser: 1.1 mg/dL — ABNORMAL HIGH (ref 0.44–1.00)
Glucose, Bld: 98 mg/dL (ref 70–99)
HCT: 38 % (ref 36.0–46.0)
Hemoglobin: 12.9 g/dL (ref 12.0–15.0)
Potassium: 4 mmol/L (ref 3.5–5.1)
Sodium: 139 mmol/L (ref 135–145)
TCO2: 26 mmol/L (ref 22–32)

## 2017-10-30 LAB — CBC
HCT: 39.7 % (ref 36.0–46.0)
Hemoglobin: 13.1 g/dL (ref 12.0–15.0)
MCH: 30.3 pg (ref 26.0–34.0)
MCHC: 33 g/dL (ref 30.0–36.0)
MCV: 91.7 fL (ref 78.0–100.0)
Platelets: 296 10*3/uL (ref 150–400)
RBC: 4.33 MIL/uL (ref 3.87–5.11)
RDW: 12.8 % (ref 11.5–15.5)
WBC: 11.5 10*3/uL — ABNORMAL HIGH (ref 4.0–10.5)

## 2017-10-30 LAB — PHOSPHORUS: Phosphorus: 3.2 mg/dL (ref 2.5–4.6)

## 2017-10-30 LAB — COMPREHENSIVE METABOLIC PANEL
ALT: 31 U/L (ref 0–44)
AST: 31 U/L (ref 15–41)
Albumin: 4.4 g/dL (ref 3.5–5.0)
Alkaline Phosphatase: 66 U/L (ref 38–126)
Anion gap: 8 (ref 5–15)
BUN: 14 mg/dL (ref 8–23)
CO2: 28 mmol/L (ref 22–32)
Calcium: 8.8 mg/dL — ABNORMAL LOW (ref 8.9–10.3)
Chloride: 104 mmol/L (ref 98–111)
Creatinine, Ser: 1.13 mg/dL — ABNORMAL HIGH (ref 0.44–1.00)
GFR calc Af Amer: 58 mL/min — ABNORMAL LOW (ref >60)
GFR calc non Af Amer: 50 mL/min — ABNORMAL LOW (ref >60)
Glucose, Bld: 103 mg/dL — ABNORMAL HIGH (ref 70–99)
Potassium: 3.9 mmol/L (ref 3.5–5.1)
Sodium: 140 mmol/L (ref 135–145)
Total Bilirubin: 0.5 mg/dL (ref 0.3–1.2)
Total Protein: 7.1 g/dL (ref 6.5–8.1)

## 2017-10-30 LAB — DIFFERENTIAL
Basophils Absolute: 0 10*3/uL (ref 0.0–0.1)
Basophils Relative: 0 %
Eosinophils Absolute: 0.2 10*3/uL (ref 0.0–0.7)
Eosinophils Relative: 2 %
Lymphocytes Relative: 13 %
Lymphs Abs: 1.4 10*3/uL (ref 0.7–4.0)
Monocytes Absolute: 0.4 10*3/uL (ref 0.1–1.0)
Monocytes Relative: 4 %
Neutro Abs: 9.4 10*3/uL — ABNORMAL HIGH (ref 1.7–7.7)
Neutrophils Relative %: 81 %

## 2017-10-30 LAB — RAPID URINE DRUG SCREEN, HOSP PERFORMED
Amphetamines: NOT DETECTED
Benzodiazepines: POSITIVE — AB
Cocaine: NOT DETECTED
Opiates: NOT DETECTED
Tetrahydrocannabinol: NOT DETECTED

## 2017-10-30 LAB — URINALYSIS, ROUTINE W REFLEX MICROSCOPIC
Bilirubin Urine: NEGATIVE
Glucose, UA: NEGATIVE mg/dL
Hgb urine dipstick: NEGATIVE
Ketones, ur: NEGATIVE mg/dL
Leukocytes, UA: NEGATIVE
Nitrite: NEGATIVE
Protein, ur: NEGATIVE mg/dL
Specific Gravity, Urine: 1.01 (ref 1.005–1.030)
pH: 9 — ABNORMAL HIGH (ref 5.0–8.0)

## 2017-10-30 LAB — GLUCOSE, CAPILLARY: Glucose-Capillary: 105 mg/dL — ABNORMAL HIGH (ref 70–99)

## 2017-10-30 LAB — CBG MONITORING, ED: Glucose-Capillary: 117 mg/dL — ABNORMAL HIGH (ref 70–99)

## 2017-10-30 LAB — I-STAT TROPONIN, ED: Troponin i, poc: 0 ng/mL (ref 0.00–0.08)

## 2017-10-30 LAB — PROTIME-INR
INR: 1.05
Prothrombin Time: 13.6 seconds (ref 11.4–15.2)

## 2017-10-30 LAB — APTT: aPTT: 32 seconds (ref 24–36)

## 2017-10-30 LAB — MAGNESIUM: Magnesium: 2.2 mg/dL (ref 1.7–2.4)

## 2017-10-30 MED ORDER — ENOXAPARIN SODIUM 40 MG/0.4ML ~~LOC~~ SOLN
40.0000 mg | SUBCUTANEOUS | Status: DC
  Administered 2017-10-31 – 2017-11-01 (×2): 40 mg via SUBCUTANEOUS
  Filled 2017-10-30 (×2): qty 0.4

## 2017-10-30 MED ORDER — ONDANSETRON HCL 4 MG PO TABS: 4.0000 mg | ORAL_TABLET | Freq: Four times a day (QID) | ORAL | Status: DC | PRN

## 2017-10-30 MED ORDER — ONDANSETRON HCL 4 MG/2ML IJ SOLN: 4.0000 mg | Freq: Four times a day (QID) | INTRAMUSCULAR | Status: DC | PRN

## 2017-10-30 MED ORDER — LORAZEPAM 2 MG/ML IJ SOLN
0.5000 mg | Freq: Once | INTRAMUSCULAR | Status: AC
Start: 2017-10-30 — End: 2017-10-30
  Administered 2017-10-30: 0.5 mg via INTRAVENOUS
  Filled 2017-10-30: qty 1

## 2017-10-30 MED ORDER — ACETAMINOPHEN 325 MG PO TABS: 650.0000 mg | ORAL_TABLET | Freq: Four times a day (QID) | ORAL | Status: DC | PRN

## 2017-10-30 MED ORDER — ACETAMINOPHEN 650 MG RE SUPP
650.0000 mg | Freq: Four times a day (QID) | RECTAL | Status: DC | PRN
  Administered 2017-10-30: 650 mg via RECTAL
  Filled 2017-10-30: qty 1

## 2017-10-30 MED ORDER — DEXTROSE-NACL 5-0.9 % IV SOLN
INTRAVENOUS | Status: DC
  Administered 2017-10-30 – 2017-10-31 (×3): via INTRAVENOUS

## 2017-10-30 NOTE — ED Provider Notes (Signed)
[facility] EMERGENCY DEPARTMENT Provider Note   CSN: [id number] Arrival date & time: 10/30/17  1751     History   Chief Complaint Chief Complaint  Patient presents with  . Altered Mental Status    HPI Jamie [name] is a 67 y.o. female.   Altered Mental Status      S/p brain tumor (sveral years ago) - had a fall a couple of days ago - fine after that - but then she went to doctor's appointment - they dx with early dementia - since then the family has tried to help with memories bringing her to places she would know - not knowing her daughter in Leonard name - was having some visual hallucinations - (this was 4 days ago), today she went to church with her kids and when she came home Jamie [name] was not her normal self.  (she takes her medicines by herself including ASA).  did not take today's meds - but was taking other day's pills today.  The pt was slurring her speech - expressive aphasia - and was calling people the wrong names.  It is unable to give any information, level 5 caveat applies  In further discussion with the family member she states that she actually has had a traumatic experience this month with her brother who she is very close to being very close to death in the hospital.  She usually talk to him every morning but is not been able to talk to him over the last 2 months because of this hospitalized admission.  Review of the medical record shows that she was seen in April at the neurology clinic, had an EEG which was significant for global slowing, no other acute findings.  Past Medical History:  Diagnosis Date  . Brain cancer ([place])   . Cancer ([place])   . CKD (chronic kidney disease), stage III ([place])   . Dementia   . High cholesterol   . Insomnia   . Lung cancer ([place])   . Osteoporosis     Patient Active Problem List   Diagnosis Date Noted  . Herniated intervertebral disc of lumbar spine 10/24/2017  . Memory loss 02/16/2017  . History of therapeutic radiation  08/03/2016  . Osteoporosis 10/09/2014  . At high risk for falls 10/09/2014  . Hx of cancer of lung 08/01/2014  . Hx of brain cancer 08/01/2014  . Personal history of colonic polyps - adenomas 07/18/2013  . Syncope 03/22/2011  . Hypotension 03/22/2011  . Memory impairment 03/22/2011  . History of cancer 03/22/2011  . Hyperlipidemia 03/22/2011    Past Surgical History:  Procedure Laterality Date  . ABDOMINAL HYSTERECTOMY  1986  . BRAIN SURGERY  2001   TUMOR REMOVAL  . catarac Bilateral   . EYE SURGERY    . LOBECTOMY       OB History    Gravida  2   Para  2   Term  2   Preterm      AB      Living        SAB      TAB      Ectopic      Multiple      Live Births               Home Medications    Prior to Admission medications   Medication Sig Start Date End Date Taking? Authorizing Provider  aspirin EC 81 MG tablet Take 81 mg by mouth daily.  [provider]  clotrimazole-betamethasone (LOTRISONE) cream Apply 1 application topically 2 (two) times daily. 08/03/16   Terald Sleeper, PA-C  diazepam (VALIUM) 10 MG tablet TAKE 1 TABLET AT BEDTIME AS NEEDED FOR ANXIETY 10/07/17   Terald Sleeper, PA-C  donepezil (ARICEPT) 5 MG tablet Take by mouth. 07/25/17 07/25/18  [provider]  folic acid (FOLVITE) 1 MG tablet Take 1 tablet (1 mg total) by mouth daily. 02/16/17   Terald Sleeper, PA-C  ibuprofen (ADVIL,MOTRIN) 800 MG tablet Take 1 tablet (800 mg total) by mouth every 8 (eight) hours as needed. 02/16/17   Terald Sleeper, PA-C  MAGNESIUM PO Take by mouth.    [provider]  Multiple Vitamins-Minerals (ALIVE WOMENS ENERGY PO) Take 1 tablet by mouth daily.    [provider]  rosuvastatin (CRESTOR) 10 MG tablet TAKE ONE TABLET AT BEDTIME 10/07/17   Terald Sleeper, PA-C  valACYclovir (VALTREX) 1000 MG tablet Take 2 tabs at onset, then 2 tabs 1 hour later 08/03/16   Terald Sleeper, PA-C    Family History Family History  Problem  Relation Age of Onset  . Cancer Mother        cirrhois of liver  . Heart attack Father   . Colon cancer Father   . Asthma Brother     Social History Social History   Tobacco Use  . Smoking status: Former Smoker    Years: 33.00    Types: Cigarettes    Last attempt to quit: 04/20/1999    Years since quitting: 18.5  . Smokeless tobacco: Never Used  Substance Use Topics  . Alcohol use: Yes    Alcohol/week: 1.8 oz    Types: 3 Cans of beer per week    Comment: occassional Saturday night 3 dark beer.  . Drug use: No     Allergies   Fosamax [alendronate sodium] and Sulfonamide derivatives   Review of Systems Review of Systems  Unable to perform ROS: Mental status change     Physical Exam Updated Vital Signs BP (!) 129/110 (BP Location: Right Arm)   Pulse 84   Resp 16   Ht 5\' 7"  (1.702 m)   Wt 76.7 kg (169 lb)   SpO2 98%   BMI 26.47 kg/m   Physical Exam  Constitutional: She appears well-developed and well-nourished. No distress.  HENT:  Head: Normocephalic and atraumatic.  Mouth/Throat: Oropharynx is clear and moist. No oropharyngeal exudate.  Eyes: Pupils are equal, round, and reactive to light. Conjunctivae and EOM are normal. Right eye exhibits no discharge. Left eye exhibits no discharge. No scleral icterus.  Neck: Normal range of motion. Neck supple. No JVD present. No thyromegaly present.  Cardiovascular: Normal rate, regular rhythm, normal heart sounds and intact distal pulses. Exam reveals no gallop and no friction rub.  No murmur heard. Pulmonary/Chest: Effort normal and breath sounds normal. No respiratory distress. She has no wheezes. She has no rales.  Abdominal: Soft. Bowel sounds are normal. She exhibits no distension and no mass. There is no tenderness.  Musculoskeletal: Normal range of motion. She exhibits no edema or tenderness.  Lymphadenopathy:    She has no cervical adenopathy.  Neurological: She is alert. Coordination normal.  Patient is able  to move both of her hands and both of her legs with normal strength, normal coordination, she is very fidgety, she is unable to answer my questions, she frequently fidgets with the medical devices on her fingers and her arm like a  blood pressure cough and the oxygen saturation monitor.  She is mumbling, she does not answer questions appropriately, she is able to count my fingers if the number is 1 or 2 but once a gets to 3 she gets distracted mumbling nonsensical words.  She cannot tell me about where she was born, where she lives or anything about what is been going on today.  Skin: Skin is warm and dry. No rash noted. No erythema.  Psychiatric: She has a normal mood and affect. Her behavior is normal.  Nursing note and vitals reviewed.    ED Treatments / Results  Labs (all labs ordered are listed, but only abnormal results are displayed) Labs Reviewed  CBG MONITORING, ED - Abnormal; Notable for the following components:      Result Value   Glucose-Capillary 117 (*)    All other components within normal limits    EKG None  Radiology No results found.  Procedures Procedures (including critical care time)  Medications Ordered in ED Medications - No data to display   Initial Impression / Assessment and Plan / ED Course  I have reviewed the triage vital signs and the nursing notes.  Pertinent labs & imaging results that were available during my care of the patient were reviewed by me and considered in my medical decision making (see chart for details).  Clinical Course as of Oct 30 2104  Nancy Fetter Oct 30, 2017  1901 Case discussed with the tele-neurologist who recommends that the patient needs an encephalopathy work-up.  Recommends EEG monitoring if no alternative source of this is found as this could be partial seizures given her prior stroke.   [BM]    Clinical Course User Index [BM] Noemi Chapel, MD    The patient has a EKG which shows normal sinus rhythm with low voltage, no  acute findings.  Her mental status is abnormal in that she is extremely confused, agitated, seems to be mumbling and not answering questions appropriately.  I do not detect any other focal neurologic abnormalities.  She has had a prior history of brain tumor resection but has never had this problem until today.  She has been having some increasing amounts of hallucinations as well as intermittent memory loss but this seems much different according to the family members.  Will activate a code stroke.  As best I can tell from the family members they last saw her this morning at 9:30 AM.  Review of the CT scan from 8 days ago shows no acute strokes tumors masses bleeding or any other abnormal findings other than the chronic findings from her prior surgery  Discussed with tele-neurology, they recommend admission, MRI and EEG.  Patient continues to have difficulty with speech and attention, family is now here and corroborates story that was heard prehospital.  Discussed with admitting doctor, Dr. Olevia Bowens, appreciate his willingness to admit the patient for further testing.  Final Clinical Impressions(s) / ED Diagnoses   Final diagnoses:  Acute encephalopathy      Noemi Chapel, MD 10/30/17 2107

## 2017-10-30 NOTE — Progress Notes (Signed)
CODE STROKE 1825 phone call 1837 finished 1837 images to SOC,completed in [place],  [facility] radiology called, spoke with Roy/Troy

## 2017-10-30 NOTE — ED Notes (Signed)
MD aware CT perfusion studies not done due to pt's agitation and inability to hold still.  CT staff states the study quality would not be usable at pt's current state.

## 2017-10-30 NOTE — Consult Note (Signed)
   [facility] TeleNeurology Consult Services  Impression: Acute encephalopathy favored over aphasia.  Clinically I don't suspect stroke.  Consider systemic causes such as metabolic/infectious (UTI) disturbances.  Consider partial seizures (of note prior EEG in Epic was abnormal with L temporal slowing and phase reversals).    -Not a tpa candidate due to:  LKW > 4.5 hours.  Comments:   Last known well (LKW) time:   10/29/17 21:30 Door Time:  17:51 [facility] contacted:  18:44 [facility] at bedside:  18:47 NIHSS assessment start time:  18:50 NIHSS assessment end time:   19:00  Recommendations: -No role for IV tPA in this case. -F/u CTP/CTA results, although clinical exam does not look suggestive for LVO that would require IR intervention. -Recommend laboratory evaluation to rule out metabolic/infectious disturbances. -Avoid psychoactive drugs. -If above negative recommend Brain MRI and EEG.  Discussed with ED MD Please call with questions  Verdis Prime, MD TeleNeurology -----------------------------------------------------------------------------------------  CC: AMS  History of Present Illness: 67 yo woman with h/o HLD, CKD, brain tumor s/p L frontal craniotomy/XRT, dementia, who presents with AMS.  She is confused/encephalopathic and not speaking coherently.  No focal deficits described.  No seizure activity observed.  LKW 21:30 last night.  Family interacted with her at 12:00 today and noticed that she was confused and unable to make sense or speak coherently.  Diagnostic: Labs pending. Head CT:  IMPRESSION: 1. No acute finding by CT. Atrophy and chronic small-vessel ischemic changes. Previous left frontal craniotomy with left frontal vertex encephalomalacia. 2. ASPECTS is 10.  Exam: Confused/encephalopathic.  Paucity of speech.  Does not answer questions appropriately.  Difficult to rule out aphasia, but I favor encephalopathy.  EOMI.  Face symmetric.   MAE symmetrically.  WAE to pain.  No gross ataxia. NIHSS score:  5  -LOC:  0  -LOC Questions:  2  -LOC Commands:  1  -Best Gaze:  0  -Visual: 0  -Facial Palsy:  0  -Motor Arm (Left):  0  -Motor Arm (Right):  0  -Motor Leg (Left): 0  -Motor Leg (Right):  0  -Limb Ataxia:  0  -Sensory:  0  -Best Language: 2  -Dysarthria:  0  -Extinction/Inattention: 0  Medical Decision Making: - Extensive number of diagnosis or management options are considered above. - Extensive amount of complex data reviewed. - High risk of complication and/or morbidity or mortality are associated with differential diagnostic considerations above. - There may be uncertain outcome and increased probability of prolonged functional impairment or high probability of severe prolonged functional impairment associated with some of these differential diagnosis.  Medical Data Reviewed: 1.  Data reviewed include clinical labs, radiology,Medical Tests; 2.  Tests results discussed w/performing or interpreting physician; 3.  Obtaining/reviewing old medical records; 4.  Obtaining case history from another source; 5.  Independent review of image, tracing or specimen.  Patient was informed the Neurology Consult would happen via TeleHealth consult by way of interactive audio and video telecommunications and consented to receiving care in this manner.

## 2017-10-30 NOTE — ED Notes (Signed)
Assisted NT with changing linens. Pt IV dressing also changed.

## 2017-10-30 NOTE — H&P (Signed)
History and Physical    Jamie [name] LOV:[id number] DOB: January 10, 1951 DOA: 10/30/2017  PCP: Terald Sleeper, PA-C   Patient coming from: Home.  I have personally briefly reviewed patient's old medical records in [place]  Chief Complaint: AMS.  HPI: Jamie [name] is a 67 y.o. female with medical history significant of lung cancer with history of brain mets (history of brain surgery and chemotherapy), chronic kidney disease, dementia, hyperlipidemia, insomnia, osteoporosis who is brought to the emergency department due to confusion.  Family member stated that the patient is unable to recognize them, 4 days ago had visual hallucinations and became very confused after coming back from church today.  Patient was also noticed to be is slurring her speech and was calling family members by wrong names.  She normally takes her medications, but was also confused as to which pills she was supposed to take today.  She had an MRI of brain and EEG in March of this year.  In April, she was seen by neurology Hyman Bower, MD) at [facility] who recommended low-dose Aricept and supportive social care.  No further history is available at this time, since the patient is unable to do out further information.  ED Course: Initial vital signs temperature 98.4 F, pulse 84, respirations 16, blood pressure 129/110 mmHg and O2 sat 98% on room air.  Her work-up shows an urinalysis with a pH of more than 9.0, but otherwise negative.  Urine toxicology was positive for benzodiazepines, but the patient has a Valium prescription.  CBC showed a white count of 11.5 with 81% neutrophils, 13% lymphocytes and 4% monocytes.  Hemoglobin was 13.1 g/dL and platelets 296.  PT, INR and PTT were within normal limits.  MP showed a glucose of 103, creatinine of 1.13 and calcium of 8.8 mg/dL.  All other values are within normal limits.  Imaging: CT head showed previous left frontal craniotomy with left frontal  vertex encephalomalacia.  There was also atrophied and chronic small vessel ischemic changes.  However there was no acute finding.  Her chest radiograph did not show any acute cardiopulmonary pathology.  Please see images and full radiology report for further detail.  Review of Systems: Unable to obtain.  Past Medical History:  Diagnosis Date  . Brain cancer ([place])   . Cancer ([place])   . CKD (chronic kidney disease), stage III ([place])   . Dementia   . High cholesterol   . Insomnia   . Lung cancer ([place])   . Osteoporosis     Past Surgical History:  Procedure Laterality Date  . ABDOMINAL HYSTERECTOMY  1986  . BRAIN SURGERY  2001   TUMOR REMOVAL  . catarac Bilateral   . EYE SURGERY    . LOBECTOMY       reports that she quit smoking about 18 years ago. Her smoking use included cigarettes. She quit after 33.00 years of use. She has never used smokeless tobacco. She reports that she drinks about 1.8 oz of alcohol per week. She reports that she does not use drugs.  Allergies  Allergen Reactions  . Fosamax [Alendronate Sodium] Other (See Comments)    "unknown"  . Sulfonamide Derivatives     REACTION: Hives, tongue swells    Family History  Problem Relation Age of Onset  . Cancer Mother        cirrhois of liver  . Heart attack Father   . Colon cancer Father   . Asthma Brother  Prior to Admission medications   Medication Sig Start Date End Date Taking? Authorizing Provider  aspirin EC 81 MG tablet Take 81 mg by mouth daily.      [provider]  clotrimazole-betamethasone (LOTRISONE) cream Apply 1 application topically 2 (two) times daily. 08/03/16   Terald Sleeper, PA-C  diazepam (VALIUM) 10 MG tablet TAKE 1 TABLET AT BEDTIME AS NEEDED FOR ANXIETY 10/07/17   Terald Sleeper, PA-C  donepezil (ARICEPT) 5 MG tablet Take by mouth. 07/25/17 07/25/18  [provider]  folic acid (FOLVITE) 1 MG tablet Take 1 tablet (1 mg total) by mouth daily. 02/16/17   Terald Sleeper,  PA-C  ibuprofen (ADVIL,MOTRIN) 800 MG tablet Take 1 tablet (800 mg total) by mouth every 8 (eight) hours as needed. 02/16/17   Terald Sleeper, PA-C  MAGNESIUM PO Take by mouth.    [provider]  Multiple Vitamins-Minerals (ALIVE WOMENS ENERGY PO) Take 1 tablet by mouth daily.    [provider]  rosuvastatin (CRESTOR) 10 MG tablet TAKE ONE TABLET AT BEDTIME 10/07/17   Terald Sleeper, PA-C  valACYclovir (VALTREX) 1000 MG tablet Take 2 tabs at onset, then 2 tabs 1 hour later 08/03/16   Terald Sleeper, PA-C    Physical Exam: Vitals:   10/30/17 2100 10/30/17 2130 10/30/17 2232 10/30/17 2235  BP: 131/88 (!) 151/82 131/73   Pulse:  87 (!) 31   Resp: 17 19 20    Temp:    (!) 100.8 F (38.2 C)  TempSrc:    Axillary  SpO2:  96% 91%   Weight:    77.8 kg (171 lb 8.3 oz)  Height:    5\' 7"  (1.702 m)    Constitutional: Restless, confused. Eyes: PERRL, lids and conjunctivae normal ENMT: Mucous membranes are mildly dry. Posterior pharynx clear of any exudate or lesions. Neck: Normal, supple, no masses, no thyromegaly Respiratory: Mildly tachypneic at 23 bpm, shallow inspiration with decreased breath sounds on bases, but otherwise clear to auscultation bilaterally, no wheezing, no crackles. No accessory muscle use.  Cardiovascular: Regular rate and rhythm, no murmurs / rubs / gallops. No extremity edema. 2+ pedal pulses. No carotid bruits.  Abdomen: Soft, no tenderness, no masses palpated. No hepatosplenomegaly. Bowel sounds positive.  Musculoskeletal: no clubbing / cyanosis. Good ROM, no contractures. Normal muscle tone.  Skin: no rashes, lesions, ulcers on limited dermatological examination. Neurologic: Grossly nonfocal, but difficult to evaluate, she moves all extremities without any specific intention. Psychiatric: Restless and delirious.  Does not follow commands.   Labs on Admission: I have personally reviewed following labs and imaging studies  CBC: Recent Labs  Lab  10/30/17 1834 10/30/17 1851  WBC 11.5*  --   NEUTROABS 9.4*  --   HGB 13.1 12.9  HCT 39.7 38.0  MCV 91.7  --   PLT 296  --    Basic Metabolic Panel: Recent Labs  Lab 10/30/17 1834 10/30/17 1851  NA 140 139  K 3.9 4.0  CL 104 102  CO2 28  --   GLUCOSE 103* 98  BUN 14 12  CREATININE 1.13* 1.10*  CALCIUM 8.8*  --   MG 2.2  --   PHOS 3.2  --    GFR: Estimated Creatinine Clearance: 54.8 mL/min (A) (by C-G formula based on SCr of 1.1 mg/dL (H)). Liver Function Tests: Recent Labs  Lab 10/30/17 1834  AST 31  ALT 31  ALKPHOS 66  BILITOT 0.5  PROT 7.1  ALBUMIN 4.4  No results for input(s): LIPASE, AMYLASE in the last 168 hours. No results for input(s): AMMONIA in the last 168 hours. Coagulation Profile: Recent Labs  Lab 10/30/17 1834  INR 1.05   Cardiac Enzymes: No results for input(s): CKTOTAL, CKMB, CKMBINDEX, TROPONINI in the last 168 hours. BNP (last 3 results) No results for input(s): PROBNP in the last 8760 hours. HbA1C: No results for input(s): HGBA1C in the last 72 hours. CBG: Recent Labs  Lab 10/30/17 1758  GLUCAP 117*   Lipid Profile: No results for input(s): CHOL, HDL, LDLCALC, TRIG, CHOLHDL, LDLDIRECT in the last 72 hours. Thyroid Function Tests: No results for input(s): TSH, T4TOTAL, FREET4, T3FREE, THYROIDAB in the last 72 hours. Anemia Panel: No results for input(s): VITAMINB12, FOLATE, FERRITIN, TIBC, IRON, RETICCTPCT in the last 72 hours. Urine analysis:    Component Value Date/Time   COLORURINE YELLOW 10/30/2017 2044   APPEARANCEUR CLEAR 10/30/2017 2044   LABSPEC 1.010 10/30/2017 2044   PHURINE 9.0 (H) 10/30/2017 2044   GLUCOSEU NEGATIVE 10/30/2017 2044   HGBUR NEGATIVE 10/30/2017 2044   BILIRUBINUR NEGATIVE 10/30/2017 2044   KETONESUR NEGATIVE 10/30/2017 2044   PROTEINUR NEGATIVE 10/30/2017 2044   UROBILINOGEN 0.2 09/08/2011 1045   NITRITE NEGATIVE 10/30/2017 2044   LEUKOCYTESUR NEGATIVE 10/30/2017 2044    Radiological Exams  on Admission: Dg Chest Port 1 View  Result Date: 10/30/2017 CLINICAL DATA:  Triage Note: Pt brought in from home by ems for c/o of altered mental status. Pt is able to answer some questions, but not others and appears confused. Review of pt's chart shows hx of same with recent decline. No focal weakness or neuro deficit noted. EXAM: PORTABLE CHEST 1 VIEW COMPARISON:  10/22/2017 FINDINGS: Cardiac silhouette is normal size. There is enlargement of the aortic arch, stable. No mediastinal or hilar masses. Lungs are clear. No convincing pleural effusion.  No pneumothorax. Skeletal structures are grossly intact. IMPRESSION: No acute cardiopulmonary disease. Electronically Signed   By: Lajean Manes M.D.   On: 10/30/2017 21:35   Ct Head Code Stroke Wo Contrast  Result Date: 10/30/2017 CLINICAL DATA:  Code stroke. Acute presentation with altered mental status. Confusion. EXAM: CT HEAD WITHOUT CONTRAST TECHNIQUE: Contiguous axial images were obtained from the base of the skull through the vertex without intravenous contrast. COMPARISON:  10/22/2017 CT.  03/22/2011 MRI. FINDINGS: Brain: Generalized atrophy. Extensive chronic small-vessel ischemic changes of the cerebral hemispheric white matter. Previous left frontal craniotomy. Focal atrophy and encephalomalacia at the left frontal vertex. No evidence of acute infarction, mass lesion, hemorrhage, hydrocephalus or extra-axial collection. Vascular: There is atherosclerotic calcification of the major vessels at the base of the brain. Skull: Left frontal craniotomy as noted above. Sinuses/Orbits: Clear Other: None ASPECTS ([place] Stroke Program Early CT Score) - Ganglionic level infarction (caudate, lentiform nuclei, internal capsule, insula, M1-M3 cortex): 7 - Supraganglionic infarction (M4-M6 cortex): 3 Total score (0-10 with 10 being normal): 10 IMPRESSION: 1. No acute finding by CT. Atrophy and chronic small-vessel ischemic changes. Previous left frontal craniotomy  with left frontal vertex encephalomalacia. 2. ASPECTS is 10. 3. These results were called by telephone at the time of interpretation on 10/30/2017 at 6:45 pm to Dr. Noemi Chapel , who verbally acknowledged these results. Electronically Signed   By: Nelson Chimes M.D.   On: 10/30/2017 18:47    EKG: Independently reviewed.  Vent. rate 88 BPM PR interval * ms QRS duration 94 ms QT/QTc 389/471 ms P-R-T axes 73 56 43 Sinus rhythm Low voltage, precordial leads  Baseline wander in lead(s) V4  Assessment/Plan Principal Problem:   Altered mental status No obvious etiology yet. Observation/stepdown. Frequent neuro checks. Get MRI of brain in a.m. Get EEG in the morning. Consider inpatient neurology consult.  Active problems:   CKD (chronic kidney disease), stage III (HCC) Monitor renal function and electrolytes.    Memory impairment Secondary to brain surgery and therapeutic radiation. Hold Aricept for now. Hold oral Valium.    Hx of cancer of lung   History of therapeutic radiation Which has caused chronic neurological deficits. However, family members state that her decline has been more pronounced in the past few months.    Hyperlipidemia Hold Crestor for now. LFTs were within normal limits.      DVT prophylaxis: Lovenox SQ. Code Status: Full code. Family Communication:  Disposition Plan: Observation for 24 to 48 hours for further work-up and monitoring. Consults called: Tele-neurology was consulted by the ED. Admission status: Inpatient/stepdown.   Reubin Milan MD [facility] Pager [phone].  If 7PM-7AM, please contact night-coverage [email or web address] Password [facility]  10/30/2017, 11:23 PM

## 2017-10-30 NOTE — ED Notes (Signed)
V/O to cancel Hepatic function panel by Dr. Olevia Bowens

## 2017-10-30 NOTE — ED Triage Notes (Signed)
Pt brought in from home by ems for c/o of altered mental status.  Pt is able to answer some questions, but not others and appears confused.  Review of pt's chart shows hx of same with recent decline.  No focal weakness or neuro deficit noted.

## 2017-10-30 NOTE — ED Notes (Signed)
Not a code stroke per neurology.

## 2017-10-30 NOTE — ED Notes (Signed)
Decision made to call code stroke after Dr. Sabra Heck spoke with family on phone (did not come to hospital).  They presented the pt is alert and oriented at baseline which contradicts documentation.

## 2017-10-31 ENCOUNTER — Observation Stay (HOSPITAL_COMMUNITY): Payer: Medicare Other

## 2017-10-31 ENCOUNTER — Observation Stay (HOSPITAL_COMMUNITY)
Admit: 2017-10-31 | Discharge: 2017-10-31 | Disposition: A | Discharge status: Home / Self Care | Payer: Medicare Other | Attending: Internal Medicine | Admitting: Internal Medicine

## 2017-10-31 DIAGNOSIS — N183 Chronic kidney disease, stage 3 unspecified: Secondary | ICD-10-CM | POA: Diagnosis present

## 2017-10-31 DIAGNOSIS — R4182 Altered mental status, unspecified: Secondary | ICD-10-CM | POA: Diagnosis not present

## 2017-10-31 LAB — COMPREHENSIVE METABOLIC PANEL
ALT: 30 U/L (ref 0–44)
AST: 29 U/L (ref 15–41)
Albumin: 3.9 g/dL (ref 3.5–5.0)
Alkaline Phosphatase: 56 U/L (ref 38–126)
Anion gap: 8 (ref 5–15)
BUN: 10 mg/dL (ref 8–23)
CO2: 26 mmol/L (ref 22–32)
Calcium: 8.7 mg/dL — ABNORMAL LOW (ref 8.9–10.3)
Chloride: 109 mmol/L (ref 98–111)
Creatinine, Ser: 1 mg/dL (ref 0.44–1.00)
GFR calc Af Amer: >60 mL/min (ref >60)
GFR calc non Af Amer: 58 mL/min — ABNORMAL LOW (ref >60)
Glucose, Bld: 114 mg/dL — ABNORMAL HIGH (ref 70–99)
Potassium: 3.6 mmol/L (ref 3.5–5.1)
Sodium: 143 mmol/L (ref 135–145)
Total Bilirubin: 0.7 mg/dL (ref 0.3–1.2)
Total Protein: 6.5 g/dL (ref 6.5–8.1)

## 2017-10-31 LAB — CBC WITH DIFFERENTIAL/PLATELET
Basophils Absolute: 0 10*3/uL (ref 0.0–0.1)
Basophils Relative: 0 %
Eosinophils Absolute: 0.2 10*3/uL (ref 0.0–0.7)
Eosinophils Relative: 2 %
HCT: 38.6 % (ref 36.0–46.0)
Hemoglobin: 12.5 g/dL (ref 12.0–15.0)
Lymphocytes Relative: 24 %
Lymphs Abs: 2 10*3/uL (ref 0.7–4.0)
MCH: 29.5 pg (ref 26.0–34.0)
MCHC: 32.4 g/dL (ref 30.0–36.0)
MCV: 91 fL (ref 78.0–100.0)
Monocytes Absolute: 0.7 10*3/uL (ref 0.1–1.0)
Monocytes Relative: 9 %
Neutro Abs: 5.2 10*3/uL (ref 1.7–7.7)
Neutrophils Relative %: 65 %
Platelets: 247 10*3/uL (ref 150–400)
RBC: 4.24 MIL/uL (ref 3.87–5.11)
RDW: 12.9 % (ref 11.5–15.5)
WBC: 8.1 10*3/uL (ref 4.0–10.5)

## 2017-10-31 LAB — MRSA PCR SCREENING: MRSA by PCR: NEGATIVE

## 2017-10-31 LAB — AMMONIA: Ammonia: 12 umol/L (ref 9–35)

## 2017-10-31 LAB — GLUCOSE, CAPILLARY
Glucose-Capillary: 92 mg/dL (ref 70–99)
Glucose-Capillary: 99 mg/dL (ref 70–99)
Glucose-Capillary: 88 mg/dL (ref 70–99)
Glucose-Capillary: 85 mg/dL (ref 70–99)

## 2017-10-31 MED ORDER — MIDAZOLAM HCL 2 MG/2ML IJ SOLN
1.0000 mg | Freq: Once | INTRAMUSCULAR | Status: AC
  Administered 2017-10-31: 1 mg via INTRAVENOUS
  Filled 2017-10-31: qty 2

## 2017-10-31 NOTE — Care Management Obs Status (Signed)
[facility] NOTIFICATION   Patient Details  Name: Jamie [name] MRN: [id number] Date of Birth: 05-01-1950   Medicare Observation Status Notification Given:  Yes(left with patient (AMS) no family available)    Courtland Reas, Chauncey Reading, RN 10/31/2017, 2:11 PM

## 2017-10-31 NOTE — Procedures (Signed)
ELECTROENCEPHALOGRAM REPORT   Patient: Jamie [name]       Room #: A310 EEG No. ID: [id number] Age: 67 y.o.        Sex: female Referring Physician: Olevia Bowens Report Date:  10/31/2017        Interpreting Physician: Alexis Goodell  History: [name] is an 67 y.o. female with a history of metastatic cancer and altered mental status  Medications:  Lovenox  Conditions of Recording:  This is a 16 channel EEG carried out with the patient in the awake, drowsy and asleep states.  Description:  The patient was awake for only a short period of the recording.  During that time the patient was able to achieve a posterior background rhythm of approximately 9-10 Hz alpha but this was poorly sustained and only seen rarely.  The patient actually spends the majority of the recording in what appears to be drowse with a slow background that includes a mixture of frequencies with theta and delta frequencies predominating although some rare intermixed faster frequencies are noted as well.  The patient clinically goes to sleep but does not achieve a stage II sleep electrographically with only sleep spindles noted during the period of time when the patient appears asleep clinically.  The background otherwise is fairly unchanged.   No epileptiform activity is noted.      Hyperventilation and intermittent photic stimulation were not performed.   IMPRESSION: This is a normal awake and drowsy electroencephalogram.  No epileptiform activity is noted.     Alexis Goodell, MD Neurology [phone] 10/31/2017, 4:23 PM

## 2017-10-31 NOTE — Progress Notes (Signed)
EEG Completed; Results Pending. Notified Dr Doy Mince.

## 2017-10-31 NOTE — Progress Notes (Signed)
Pt unavailable for EEG at this time, she is going to MRI per her RN. Will try again as schedule permits.

## 2017-10-31 NOTE — Plan of Care (Signed)
  Problem: Acute Rehab PT Goals(only PT should resolve) Goal: Pt Will Go Supine/Side To Sit Outcome: Progressing Flowsheets (Taken 10/31/2017 1534) Pt will go Supine/Side to Sit: with supervision Goal: Patient Will Transfer Sit To/From Stand Outcome: Progressing Flowsheets (Taken 10/31/2017 1534) Patient will transfer sit to/from stand: with supervision Goal: Pt Will Transfer Bed To Chair/Chair To Bed Outcome: Progressing Flowsheets (Taken 10/31/2017 1534) Pt will Transfer Bed to Chair/Chair to Bed: with supervision Goal: Pt Will Ambulate Outcome: Progressing Flowsheets (Taken 10/31/2017 1534) Pt will Ambulate: with min guard assist;75 feet;with rolling walker   3:34 PM, 10/31/17 Lonell Grandchild, MPT Physical Therapist with [facility] [phone] office [phone] mobile phone

## 2017-10-31 NOTE — Evaluation (Signed)
Clinical/Bedside Swallow Evaluation Patient Details  Name: Jamie [name] MRN: [id number] Date of Birth: 08-Dec-1950  Today's Date: 10/31/2017 Time: SLP Start Time (ACUTE ONLY): 1400 SLP Stop Time (ACUTE ONLY): 1425 SLP Time Calculation (min) (ACUTE ONLY): 25 min  Past Medical History:  Past Medical History:  Diagnosis Date  . Brain cancer ([place])   . Cancer ([place])   . CKD (chronic kidney disease), stage III ([place])   . Dementia   . High cholesterol   . Insomnia   . Lung cancer ([place])   . Osteoporosis    Past Surgical History:  Past Surgical History:  Procedure Laterality Date  . ABDOMINAL HYSTERECTOMY  1986  . BRAIN SURGERY  2001   TUMOR REMOVAL  . catarac Bilateral   . EYE SURGERY    . LOBECTOMY     HPI:  Jamie [name] is a 67 y.o. female with medical history significant of lung cancer with history of brain mets (history of brain surgery and chemotherapy), chronic kidney disease, dementia, hyperlipidemia, insomnia, osteoporosis who is brought to the emergency department due to confusion that apparently has worsened over the last few days.   Assessment / Plan / Recommendation Clinical Impression  Clinical swallow evaluation completed at bedside. Oral motor evaluation is WNL; Pt without gross weakness. Pt shows no overt signs or symptoms of aspiration with consistencies and textures presented. Pt was slow to respond to SLP questions, but was oriented to place, month, and off date by two. Pt acknowledged that she had been having more difficulty getting her words out in the past several months. Recommend regular diet with thin liquids with standard aspiration and reflux precautions. No further SLP f/u for dysphagia indicated at this time, however Pt may benefit from SLE in acute setting or in home healthy setting given verbal expression and cognitive deficits.    SLP Visit Diagnosis: Dysphagia, unspecified (R13.10)    Aspiration Risk  No limitations    Diet Recommendation Regular;Thin  liquid   Liquid Administration via: Straw;Cup Medication Administration: Whole meds with liquid Supervision: Patient able to self feed Postural Changes: Seated upright at 90 degrees;Remain upright for at least 30 minutes after po intake    Other  Recommendations Oral Care Recommendations: Oral care BID Other Recommendations: Clarify dietary restrictions   Follow up Recommendations Home health SLP(Pt may benefit from SLE and f/u SLP via [facility])      Frequency and Duration            Prognosis Prognosis for Safe Diet Advancement: Good Barriers to Reach Goals: Cognitive deficits      Swallow Study   General Date of Onset: 10/30/17 HPI: JAILANI HOGANS is a 67 y.o. female with medical history significant of lung cancer with history of brain mets (history of brain surgery and chemotherapy), chronic kidney disease, dementia, hyperlipidemia, insomnia, osteoporosis who is brought to the emergency department due to confusion that apparently has worsened over the last few days. Type of Study: Bedside Swallow Evaluation Previous Swallow Assessment: None on record Diet Prior to this Study: NPO Temperature Spikes Noted: No Respiratory Status: Room air History of Recent Intubation: No Behavior/Cognition: Alert;Cooperative;Pleasant mood Oral Cavity Assessment: Within Functional Limits Oral Care Completed by SLP: No Oral Cavity - Dentition: Adequate natural dentition Vision: Functional for self-feeding Self-Feeding Abilities: Able to feed self Patient Positioning: Upright in bed Baseline Vocal Quality: Normal Volitional Cough: Strong Volitional Swallow: Able to elicit    Oral/Motor/Sensory Function Overall Oral Motor/Sensory Function: Within functional limits  Ice Chips Ice chips: Within functional limits Presentation: Spoon   Thin Liquid Thin Liquid: Within functional limits Presentation: Cup;Straw;Self Fed    Nectar Thick Nectar Thick Liquid: Not tested   Honey Thick Honey Thick  Liquid: Not tested   Puree Puree: Within functional limits Presentation: Spoon   Solid   Thank you,  Genene Churn, CCC-SLP [phone]    Solid: Within functional limits        Jamie [name] 10/31/2017,2:58 PM

## 2017-10-31 NOTE — Progress Notes (Signed)
PROGRESS NOTE    Jamie [name]  YNW:[id number] DOB: 14-May-1950 DOA: 10/30/2017 PCP: Terald Sleeper, PA-C   Brief Narrative:   Jamie [name] is a 67 y.o. female with medical history significant of lung cancer with history of brain mets (history of brain surgery and chemotherapy), chronic kidney disease, dementia, hyperlipidemia, insomnia, osteoporosis who is brought to the emergency department due to confusion that apparently has worsened over the last few days.  Assessment & Plan:   Principal Problem:   Altered mental status Active Problems:   Memory impairment   Hyperlipidemia   Hx of cancer of lung   History of therapeutic radiation   CKD (chronic kidney disease), stage III ([place])   1. Acute on chronic encephalopathy in setting of cognitive impairment.  This appears to be related to progressive dementia with history of brain mets and prior craniectomy.  Work-up is still pending with brain MRI and EEG today.  Will discuss further with family members and see if neurologic consultation is necessary.  Transfer to [place].  Decrease neurochecks frequency.  Currently holding Aricept and Valium. 2. CKD stage III.  Stable.  Continue to monitor a.m. renal panel. 3. History of lung carcinoma with therapeutic radiation.  This is likely responsible for chronic neurological deficits.  Progressive decline per family members.  Will likely need further work-up at [facility] with her neurologist. 4. Dyslipidemia.  Crestor currently withheld with LFTs in normal limits.  DVT prophylaxis:Lovenox Code Status: Full Family Communication: None at bedside currently Disposition Plan: Workup for AMS pending with imaging and EEG; will follow for improvement   Consultants:   None  Procedures:   None  Antimicrobials:   None   Subjective: Patient seen and evaluated today with no new acute complaints or concerns. No acute concerns or events noted overnight.  She appears to be more alert and less  confused per nursing staff with frequent neuro checks.  She states that it is currently May 1999.  Objective: Vitals:   10/31/17 0500 10/31/17 0504 10/31/17 0600 10/31/17 0700  BP:  138/89 140/83   Pulse:  81 67   Resp:  17 12   Temp:    97.7 F (36.5 C)  TempSrc:    Axillary  SpO2:  96% 94%   Weight: 77.8 kg (171 lb 8.3 oz)     Height:        Intake/Output Summary (Last 24 hours) at 10/31/2017 0722 Last data filed at 10/31/2017 0517 Gross per 24 hour  Intake 455 ml  Output 100 ml  Net 355 ml   Filed Weights   10/30/17 1755 10/30/17 2235 10/31/17 0500  Weight: 76.7 kg (169 lb) 77.8 kg (171 lb 8.3 oz) 77.8 kg (171 lb 8.3 oz)    Examination:  General exam: Appears calm and comfortable; somnolent  Respiratory system: Clear to auscultation. Respiratory effort normal. On RA. Cardiovascular system: S1 & S2 heard, RRR. No JVD, murmurs, rubs, gallops or clicks. No pedal edema. Gastrointestinal system: Abdomen is nondistended, soft and nontender. No organomegaly or masses felt. Normal bowel sounds heard. Central nervous system: Minimally alert and currently disoriented.  Cannot complete rest of the examination. Extremities: Symmetric 5 x 5 power. Skin: No rashes, lesions or ulcers    Data Reviewed: I have personally reviewed following labs and imaging studies  CBC: Recent Labs  Lab 10/30/17 1834 10/30/17 1851 10/31/17 0612  WBC 11.5*  --  8.1  NEUTROABS 9.4*  --  5.2  HGB 13.1 12.9 12.5  HCT 39.7 38.0 38.6  MCV 91.7  --  91.0  PLT 296  --  174   Basic Metabolic Panel: Recent Labs  Lab 10/30/17 1834 10/30/17 1851 10/31/17 0612  NA 140 139 143  K 3.9 4.0 3.6  CL 104 102 109  CO2 28  --  26  GLUCOSE 103* 98 114*  BUN 14 12 10   CREATININE 1.13* 1.10* 1.00  CALCIUM 8.8*  --  8.7*  MG 2.2  --   --   PHOS 3.2  --   --    GFR: Estimated Creatinine Clearance: 60.3 mL/min (by C-G formula based on SCr of 1 mg/dL). Liver Function Tests: Recent Labs  Lab  10/30/17 1834 10/31/17 0612  AST 31 29  ALT 31 30  ALKPHOS 66 56  BILITOT 0.5 0.7  PROT 7.1 6.5  ALBUMIN 4.4 3.9   No results for input(s): LIPASE, AMYLASE in the last 168 hours. Recent Labs  Lab 10/31/17 0612  AMMONIA 12   Coagulation Profile: Recent Labs  Lab 10/30/17 1834  INR 1.05   Cardiac Enzymes: No results for input(s): CKTOTAL, CKMB, CKMBINDEX, TROPONINI in the last 168 hours. BNP (last 3 results) No results for input(s): PROBNP in the last 8760 hours. HbA1C: No results for input(s): HGBA1C in the last 72 hours. CBG: Recent Labs  Lab 10/30/17 1758 10/30/17 2326 10/31/17 0638  GLUCAP 117* 105* 99   Lipid Profile: No results for input(s): CHOL, HDL, LDLCALC, TRIG, CHOLHDL, LDLDIRECT in the last 72 hours. Thyroid Function Tests: No results for input(s): TSH, T4TOTAL, FREET4, T3FREE, THYROIDAB in the last 72 hours. Anemia Panel: No results for input(s): VITAMINB12, FOLATE, FERRITIN, TIBC, IRON, RETICCTPCT in the last 72 hours. Sepsis Labs: No results for input(s): PROCALCITON, LATICACIDVEN in the last 168 hours.  Recent Results (from the past 240 hour(s))  Blood Culture (routine x 2)     Status: Abnormal   Collection Time: 10/22/17  3:58 AM  Result Value Ref Range Status   Specimen Description   Final    RIGHT ANTECUBITAL Performed at [facility], [address], [place], [place] [address]    Special Requests   Final    BOTTLES DRAWN AEROBIC AND ANAEROBIC Blood Culture adequate volume Performed at [facility], [address] [place], [place] [address]    Culture  Setup Time   Final    GRAM POSITIVE COCCI Gram Stain Report Called to,Read Back By and Verified With: PRUITT,G. AT 0059 ON 10/23/2017 BY EVA ANAEROBIC BOTTLE ONLY Performed at [facility] WITH RESULT CRITICAL RESULT CALLED TO, READ BACK BY AND VERIFIED WITH: G.Bernadette Hoit [id number] 10/23/17 M.CAMPBELL    Culture (A)  Final    VIRIDANS STREPTOCOCCUS THE  SIGNIFICANCE OF ISOLATING THIS ORGANISM FROM A SINGLE SET OF BLOOD CULTURES WHEN MULTIPLE SETS ARE DRAWN IS UNCERTAIN. PLEASE NOTIFY THE MICROBIOLOGY DEPARTMENT WITHIN ONE WEEK IF SPECIATION AND SENSITIVITIES ARE REQUIRED. Performed at [facility], [place] [address], [place],  [address]    Report Status 10/25/2017 FINAL  Final  Blood Culture ID Panel (Reflexed)     Status: Abnormal   Collection Time: 10/22/17  3:58 AM  Result Value Ref Range Status   Enterococcus species NOT DETECTED NOT DETECTED Final   Listeria monocytogenes NOT DETECTED NOT DETECTED Final   Staphylococcus species NOT DETECTED NOT DETECTED Final   Staphylococcus aureus NOT DETECTED NOT DETECTED Final   Streptococcus species DETECTED (A) NOT DETECTED Final    Comment: Not  Enterococcus species, Streptococcus agalactiae, Streptococcus pyogenes, or Streptococcus pneumoniae. CRITICAL RESULT CALLED TO, READ BACK BY AND VERIFIED WITH: G.Bernadette Hoit [id number] 10/23/17 M.CAMPBELL    Streptococcus agalactiae NOT DETECTED NOT DETECTED Final   Streptococcus pneumoniae NOT DETECTED NOT DETECTED Final   Streptococcus pyogenes NOT DETECTED NOT DETECTED Final   Acinetobacter baumannii NOT DETECTED NOT DETECTED Final   Enterobacteriaceae species NOT DETECTED NOT DETECTED Final   Enterobacter cloacae complex NOT DETECTED NOT DETECTED Final   Escherichia coli NOT DETECTED NOT DETECTED Final   Klebsiella oxytoca NOT DETECTED NOT DETECTED Final   Klebsiella pneumoniae NOT DETECTED NOT DETECTED Final   Proteus species NOT DETECTED NOT DETECTED Final   Serratia marcescens NOT DETECTED NOT DETECTED Final   Haemophilus influenzae NOT DETECTED NOT DETECTED Final   Neisseria meningitidis NOT DETECTED NOT DETECTED Final   Pseudomonas aeruginosa NOT DETECTED NOT DETECTED Final   Candida albicans NOT DETECTED NOT DETECTED Final   Candida glabrata NOT DETECTED NOT DETECTED Final   Candida krusei NOT DETECTED NOT DETECTED Final   Candida  parapsilosis NOT DETECTED NOT DETECTED Final   Candida tropicalis NOT DETECTED NOT DETECTED Final    Comment: Performed at [facility], [place] [address] [place], [place] [address]  Blood Culture (routine x 2)     Status: None   Collection Time: 10/22/17  4:03 AM  Result Value Ref Range Status   Specimen Description BLOOD LEFT HAND  Final   Special Requests   Final    BOTTLES DRAWN AEROBIC ONLY Blood Culture adequate volume   Culture   Final    NO GROWTH 5 DAYS Performed at [facility], [address] [place], [place] [address]    Report Status 10/27/2017 FINAL  Final  Urine culture     Status: None   Collection Time: 10/22/17  4:20 AM  Result Value Ref Range Status   Specimen Description   Final    URINE, CATHETERIZED Performed at [facility], [address] [place], [place] [address]    Special Requests   Final    NONE Performed at [facility], [address], [place], [place] [address]    Culture   Final    NO GROWTH Performed at [facility], [place] [address] [place], [place] [address]    Report Status 10/23/2017 FINAL  Final  MRSA PCR Screening     Status: None   Collection Time: 10/30/17 10:20 PM  Result Value Ref Range Status   MRSA by PCR NEGATIVE NEGATIVE Final    Comment:        The GeneXpert MRSA Assay (FDA approved for NASAL specimens only), is one component of a comprehensive MRSA colonization surveillance program. It is not intended to diagnose MRSA infection nor to guide or monitor treatment for MRSA infections. Performed at [facility], [address] [place], [place] [address]          Radiology Studies: Dg Chest [facility] 1 View  Result Date: 10/30/2017 CLINICAL DATA:  Triage Note: Pt brought in from home by ems for c/o of altered mental status. Pt is able to answer some questions, but not others and appears confused. Review of pt's chart shows hx of same with recent decline. No focal weakness or neuro deficit noted. EXAM: PORTABLE  CHEST 1 VIEW COMPARISON:  10/22/2017 FINDINGS: Cardiac silhouette is normal size. There is enlargement of the aortic arch, stable. No mediastinal or hilar masses. Lungs are clear. No convincing pleural effusion.  No pneumothorax.  Skeletal structures are grossly intact. IMPRESSION: No acute cardiopulmonary disease. Electronically Signed   By: Lajean Manes M.D.   On: 10/30/2017 21:35   Ct Head Code Stroke Wo Contrast  Result Date: 10/30/2017 CLINICAL DATA:  Code stroke. Acute presentation with altered mental status. Confusion. EXAM: CT HEAD WITHOUT CONTRAST TECHNIQUE: Contiguous axial images were obtained from the base of the skull through the vertex without intravenous contrast. COMPARISON:  10/22/2017 CT.  03/22/2011 MRI. FINDINGS: Brain: Generalized atrophy. Extensive chronic small-vessel ischemic changes of the cerebral hemispheric white matter. Previous left frontal craniotomy. Focal atrophy and encephalomalacia at the left frontal vertex. No evidence of acute infarction, mass lesion, hemorrhage, hydrocephalus or extra-axial collection. Vascular: There is atherosclerotic calcification of the major vessels at the base of the brain. Skull: Left frontal craniotomy as noted above. Sinuses/Orbits: Clear Other: None ASPECTS ([place] Stroke Program Early CT Score) - Ganglionic level infarction (caudate, lentiform nuclei, internal capsule, insula, M1-M3 cortex): 7 - Supraganglionic infarction (M4-M6 cortex): 3 Total score (0-10 with 10 being normal): 10 IMPRESSION: 1. No acute finding by CT. Atrophy and chronic small-vessel ischemic changes. Previous left frontal craniotomy with left frontal vertex encephalomalacia. 2. ASPECTS is 10. 3. These results were called by telephone at the time of interpretation on 10/30/2017 at 6:45 pm to Dr. Noemi Chapel , who verbally acknowledged these results. Electronically Signed   By: Nelson Chimes M.D.   On: 10/30/2017 18:47        Scheduled Meds: . enoxaparin (LOVENOX)  injection  40 mg Subcutaneous Q24H   Continuous Infusions: . dextrose 5 % and 0.9% NaCl 100 mL/hr at 10/31/17 0600     LOS: 0 days    Time spent: 30 minutes    Wynona Duhamel Darleen Crocker, DO [facility] Pager [phone]  If 7PM-7AM, please contact night-coverage [email or web address] Password [email or web address] 10/31/2017, 7:22 AM

## 2017-10-31 NOTE — Evaluation (Signed)
Physical Therapy Evaluation Patient Details Name: Jamie [name] MRN: [id number] DOB: 1950-11-21 Today's Date: 10/31/2017   History of Present Illness  Jamie [name] is a 67 y.o. female with medical history significant of lung cancer with history of brain mets (history of brain surgery and chemotherapy), chronic kidney disease, dementia, hyperlipidemia, insomnia, osteoporosis who is brought to the emergency department due to confusion.  Family member stated that the patient is unable to recognize them, 4 days ago had visual hallucinations and became very confused after coming back from church today.  Patient was also noticed to be is slurring her speech and was calling family members by wrong names.  She normally takes her medications, but was also confused as to which pills she was supposed to take today.  She had an MRI of brain and EEG in March of this year.  In April, she was seen by neurology Hyman Bower, MD) at [facility] who recommended low-dose Aricept and supportive social care.  No further history is available at this time, since the patient is unable to do out further information.    Clinical Impression  Patient demonstrates slow slightly labored movement for sitting up at bedside and tends to lean on nearby objects for support during ambulation in hallway and patient states she leans on furniture at home.  Once patient is fatigued she tends to slow cadence and put back to bed after therapy to have EEG completed.  Patient will benefit from continued physical therapy in hospital and recommended venue below to increase strength, balance, endurance for safe ADLs and gait.    Follow Up Recommendations Home health PT;Supervision for mobility/OOB    Equipment Recommendations  None recommended by PT    Recommendations for Other Services       Precautions / Restrictions Precautions Precautions: Fall Restrictions Weight Bearing Restrictions: No      Mobility  Bed  Mobility Overal bed mobility: Needs Assistance Bed Mobility: Supine to Sit;Sit to Supine     Supine to sit: Min guard Sit to supine: Supervision   General bed mobility comments: slow labored movement to sit up at bedside  Transfers Overall transfer level: Needs assistance Equipment used: 1 person hand held assist Transfers: Sit to/from [facility] Sit to Stand: Min guard Stand pivot transfers: Min guard          Ambulation/Gait Ambulation/Gait assistance: Min assist Gait Distance (Feet): 45 Feet Assistive device: 1 person hand held assist Gait Pattern/deviations: Decreased step length - right;Decreased step length - left;Decreased stride length;Ataxic Gait velocity: decreased   General Gait Details: slow slightly labored unsteady cadence with frequent leaning on objects for support, ataxic like gait with wide base of support and decreased armswing  Stairs            Wheelchair Mobility    Modified Rankin (Stroke Patients Only)       Balance Overall balance assessment: Needs assistance Sitting-balance support: Feet supported;No upper extremity supported Sitting balance-Leahy Scale: Good     Standing balance support: No upper extremity supported;During functional activity Standing balance-Leahy Scale: Fair Standing balance comment: fair/poor without AD                             Pertinent Vitals/Pain Pain Assessment: No/denies pain    Home Living Family/patient expects to be discharged to:: Private residence Living Arrangements: Children(son) Available Help at Discharge: Family Type of Home: Mobile home Home Access:  Stairs to enter Entrance Stairs-Rails: Right Entrance Stairs-Number of Steps: 4-5 Home Layout: One level Home Equipment: Walker - 2 wheels;Shower seat;Cane - single point      Prior Function Level of Independence: Needs assistance   Gait / Transfers Assistance Needed: household ambulator without AD per  patient  ADL's / Homemaking Assistance Needed: assisted by family        Hand Dominance        Extremity/Trunk Assessment   Upper Extremity Assessment Upper Extremity Assessment: Generalized weakness    Lower Extremity Assessment Lower Extremity Assessment: Generalized weakness    Cervical / Trunk Assessment Cervical / Trunk Assessment: Normal  Communication   Communication: No difficulties  Cognition Arousal/Alertness: Awake/alert Behavior During Therapy: WFL for tasks assessed/performed Overall Cognitive Status: Within Functional Limits for tasks assessed                                        General Comments      Exercises     Assessment/Plan    PT Assessment Patient needs continued PT services  PT Problem List Decreased strength;Decreased activity tolerance;Decreased balance;Decreased mobility       PT Treatment Interventions Gait training;Stair training;Functional mobility training;Therapeutic activities;Therapeutic exercise;Patient/family education    PT Goals (Current goals can be found in the Care Plan section)  Acute Rehab PT Goals Patient Stated Goal: return home PT Goal Formulation: With patient Time For Goal Achievement: 11/07/17 Potential to Achieve Goals: Good    Frequency Min 3X/week   Barriers to discharge        Co-evaluation               AM-PAC PT "6 Clicks" Daily Activity  Outcome Measure Difficulty turning over in bed (including adjusting bedclothes, sheets and blankets)?: None Difficulty moving from lying on back to sitting on the side of the bed? : A Little Difficulty sitting down on and standing up from a chair with arms (e.g., wheelchair, bedside commode, etc,.)?: A Little Help needed moving to and from a bed to chair (including a wheelchair)?: A Little Help needed walking in hospital room?: A Little Help needed climbing 3-5 steps with a railing? : A Lot 6 Click Score: 18    End of Session    Activity Tolerance: Patient tolerated treatment well;Patient limited by fatigue Patient left: in bed;with call bell/phone within reach;with bed alarm set;with nursing/sitter in room Nurse Communication: Mobility status PT Visit Diagnosis: Unsteadiness on feet (R26.81);Other abnormalities of gait and mobility (R26.89);Muscle weakness (generalized) (M62.81)    Time: 2122-4825 PT Time Calculation (min) (ACUTE ONLY): 29 min   Charges:   PT Evaluation $PT Eval Moderate Complexity: 1 Mod PT Treatments $Therapeutic Activity: 23-37 mins   PT G Codes:        3:29 PM, 11-10-2017 Lonell Grandchild, MPT Physical Therapist with [facility] [phone] office [phone] mobile phone

## 2017-11-01 DIAGNOSIS — R4182 Altered mental status, unspecified: Secondary | ICD-10-CM | POA: Diagnosis not present

## 2017-11-01 LAB — GLUCOSE, CAPILLARY
Glucose-Capillary: 100 mg/dL — ABNORMAL HIGH (ref 70–99)
Glucose-Capillary: 111 mg/dL — ABNORMAL HIGH (ref 70–99)
Glucose-Capillary: 134 mg/dL — ABNORMAL HIGH (ref 70–99)

## 2017-11-01 LAB — COMPREHENSIVE METABOLIC PANEL
ALT: 33 U/L (ref 0–44)
AST: 29 U/L (ref 15–41)
Albumin: 3.8 g/dL (ref 3.5–5.0)
Alkaline Phosphatase: 61 U/L (ref 38–126)
Anion gap: 7 (ref 5–15)
BUN: 11 mg/dL (ref 8–23)
CO2: 27 mmol/L (ref 22–32)
Calcium: 8.8 mg/dL — ABNORMAL LOW (ref 8.9–10.3)
Chloride: 109 mmol/L (ref 98–111)
Creatinine, Ser: 0.96 mg/dL (ref 0.44–1.00)
GFR calc Af Amer: >60 mL/min (ref >60)
GFR calc non Af Amer: >60 mL/min (ref >60)
Glucose, Bld: 107 mg/dL — ABNORMAL HIGH (ref 70–99)
Potassium: 3.8 mmol/L (ref 3.5–5.1)
Sodium: 143 mmol/L (ref 135–145)
Total Bilirubin: 0.5 mg/dL (ref 0.3–1.2)
Total Protein: 6.5 g/dL (ref 6.5–8.1)

## 2017-11-01 LAB — MAGNESIUM: Magnesium: 2.2 mg/dL (ref 1.7–2.4)

## 2017-11-01 MED ORDER — DONEPEZIL HCL 5 MG PO TABS: 5.0000 mg | ORAL_TABLET | Freq: Every day | ORAL | 11 refills | Status: DC

## 2017-11-01 NOTE — Progress Notes (Signed)
Discharge instructions read to patient and her granddaughter Velna Hatchet. Pt will need reinforcement. Brandi verbalized understanding of all instructions. Discharged to home with family

## 2017-11-01 NOTE — Care Management Note (Signed)
Case Management Note  Patient Details  Name: Jamie [name] MRN: [id number] Date of Birth: 1950-06-10  Subjective/Objective:  AMS. Patient now alert and oriented, sitting up in bed [name] newspaper. Knows, name, DOB and that she is at Jamie [name]. 'No family at bedside. Her son, girlfriend, and two children live with her. She reports she has cane and RW at home. She walked 45 feet with PT. Recommended for [facility] PT. She is agreeable. Offered choice of agencies.  Offered to call family and discuss, she declines.  She reports her PCP as Jamie [name].            Action/Plan: Jamie [name] of [facility] notified and will obtain orders when available.   Expected Discharge Date:   11/01/2017               Expected Discharge Plan:  [place]  In-House Referral:     Discharge planning Services  CM Consult  Post Acute Care Choice:  Home Health Choice offered to:  Patient  DME Arranged:    DME Agency:     HH Arranged:  PT [place]:  [place]  Status of Service:  Completed, signed off  If discussed at [place] of Stay Meetings, dates discussed:    Additional Comments:  Jamie [name], Jamie [place], RN 11/01/2017, 11:14 AM

## 2017-11-01 NOTE — Discharge Summary (Signed)
Physician Discharge Summary  Jamie [name] TOI:[id number] DOB: [birth year] DOA: 10/30/2017  PCP: Terald Sleeper, PA-C  Admit date: 10/30/2017  Discharge date: 11/01/2017  Admitted From:Home  Disposition:  Home  Recommendations for Outpatient Follow-up:  1. Follow up with PCP in 1-2 weeks  Home Health:Yes with PT  Equipment/Devices:N/A  Discharge Condition:Stable  CODE STATUS: Full  Diet recommendation: Heart Healthy  Brief/Interim Summary:  Jamie Conley Smithis a 67 y.o.femalewith medical history significant of lung cancer with history of brain mets (history of brain surgery and chemotherapy), chronic kidney disease, dementia, hyperlipidemia, insomnia, osteoporosis who is brought to the emergency department due to confusion that apparently has worsened over the last few days.  She underwent full work-up with brain MRI/MRI as well as EEG with no acute findings.  She spontaneously improved over period of 24 hours and return back to her usual baseline level of functioning per family members at bedside.  It appears that much of her symptoms may be related to progressive decline in mental status related to dementia.  She has been encouraged to remain on Aricept 5 mg nightly as previously prescribed by her neurologist Dr. Olen Pel at [facility].  She has been seen by physical therapy with recommendations for home health services which has been set up for her prior to discharge.   Discharge Diagnoses:  Principal Problem:   Altered mental status Active Problems:   Memory impairment   Hyperlipidemia   Hx of cancer of lung   History of therapeutic radiation   CKD (chronic kidney disease), stage III [facility])    Discharge Instructions  Discharge Instructions    Diet - low sodium heart healthy   Complete by:  As directed    Increase activity slowly   Complete by:  As directed      Allergies as of 11/01/2017      Reactions   Fosamax [alendronate Sodium] Other (See Comments)   "unknown"    Sulfonamide Derivatives    REACTION: Hives, tongue swells      Medication List    TAKE these medications   ALIVE WOMENS ENERGY PO Take 1 tablet by mouth daily.   aspirin EC 81 MG tablet Take 81 mg by mouth daily.   clotrimazole-betamethasone cream Commonly known as:  LOTRISONE Apply 1 application topically 2 (two) times daily.   diazepam 10 MG tablet Commonly known as:  VALIUM TAKE 1 TABLET AT BEDTIME AS NEEDED FOR ANXIETY   donepezil 5 MG tablet Commonly known as:  ARICEPT Take 1 tablet (5 mg total) by mouth at bedtime. What changed:    how much to take  when to take this   folic acid 1 MG tablet Commonly known as:  FOLVITE Take 1 tablet (1 mg total) by mouth daily.   ibuprofen 800 MG tablet Commonly known as:  ADVIL,MOTRIN Take 1 tablet (800 mg total) by mouth every 8 (eight) hours as needed.   MAGNESIUM PO Take by mouth.   rosuvastatin 10 MG tablet Commonly known as:  CRESTOR TAKE ONE TABLET AT BEDTIME   valACYclovir 1000 MG tablet Commonly known as:  VALTREX Take 2 tabs at onset, then 2 tabs 1 hour later      Follow-up Information    [name], PA-C Follow up in 1 week(s).   Specialties:  Physician Assistant, Family Medicine Contact information: [address] [place] [address] [phone]          Allergies  Allergen Reactions  . Fosamax [Alendronate Sodium]  Other (See Comments)    "unknown"  . Sulfonamide Derivatives     REACTION: Hives, tongue swells    Consultations:  None   Procedures/Studies: Dg Chest 1 View  Result Date: 10/22/2017 CLINICAL DATA:  Fever.  Altered mental status.  Foul smelling urine. EXAM: CHEST  1 VIEW COMPARISON:  09/13/2012 FINDINGS: Shallow inspiration. Heart size and pulmonary vascularity are normal. Lungs appear clear and expanded. No blunting of costophrenic angles. No pneumothorax. Calcification of the aorta. Fracture deformity of the proximal right humerus, better seen on previous right shoulder  examination from 08/01/2017. IMPRESSION: Shallow inspiration.  No evidence of active pulmonary disease. Electronically Signed   By: Lucienne Capers M.D.   On: 10/22/2017 04:07   Ct Head Wo Contrast  Result Date: 10/22/2017 CLINICAL DATA:  Altered level of consciousness. Patient was stumbling around with foul smelling urine. EXAM: CT HEAD WITHOUT CONTRAST TECHNIQUE: Contiguous axial images were obtained from the base of the skull through the vertex without intravenous contrast. COMPARISON:  MRI brain 03/22/2011.  CT head 03/21/2011. FINDINGS: Brain: Diffuse cerebral atrophy. Ventricular dilatation consistent with central atrophy. Low-attenuation changes in the deep white matter consistent small vessel ischemia. Encephalomalacia in the left anterior frontal lobe consistent with old postoperative change. No change since prior study. No mass-effect or midline shift. No abnormal extra-axial fluid collections. Basal cisterns are not effaced. No acute intracranial hemorrhage. Vascular: No hyperdense vessel or unexpected calcification. Skull: Postoperative changes with left frontal craniotomy. Diffuse bone demineralization likely representing metabolic change or osteoporosis. No acute depressed skull fractures. Sinuses/Orbits: Paranasal sinuses and mastoid air cells are clear. Other: None. IMPRESSION: 1. No acute intracranial abnormalities. Chronic atrophy and small vessel ischemic changes. 2. Postoperative left frontal craniotomy with underlying encephalomalacia. No change since prior study. Electronically Signed   By: Lucienne Capers M.D.   On: 10/22/2017 04:59   Mr Brain Wo Contrast  Result Date: 10/31/2017 CLINICAL DATA:  Altered mental status. History of brain tumor. History of lung cancer. EXAM: MRI HEAD WITHOUT CONTRAST TECHNIQUE: Multiplanar, multiecho pulse sequences of the brain and surrounding structures were obtained without intravenous contrast. COMPARISON:  CT head 10/30/2017, MRI head 03/22/2011  FINDINGS: Brain: Negative for acute infarct. Postsurgical encephalomalacia left frontal lobe with surrounding hyperintensity is unchanged from the prior MRI and CT. Hyperintensity in the subcortical white matter of the right frontal lobe is unchanged from 2012. Mild periventricular white matter hyperintensity bilaterally. Multiple areas of chronic microhemorrhage in the brain as noted previously. No fluid collection or midline shift. Vascular: Normal arterial flow voids. Skull and upper cervical spine: Left frontal craniotomy Sinuses/Orbits: Mucosal edema paranasal sinuses. Left mastoid effusion. Bilateral cataract surgery. Other: None IMPRESSION: No acute intracranial abnormality.  No change from 2012 Postsurgical encephalomalacia left frontal lobe from prior tumor resection without recurrence. Multiple areas of chronic microhemorrhage in the brain. Correlate with history of poorly controlled hypertension. Electronically Signed   By: Franchot Gallo M.D.   On: 10/31/2017 10:47   Dg Chest Port 1 View  Result Date: 10/30/2017 CLINICAL DATA:  Triage Note: Pt brought in from home by ems for c/o of altered mental status. Pt is able to answer some questions, but not others and appears confused. Review of pt's chart shows hx of same with recent decline. No focal weakness or neuro deficit noted. EXAM: PORTABLE CHEST 1 VIEW COMPARISON:  10/22/2017 FINDINGS: Cardiac silhouette is normal size. There is enlargement of the aortic arch, stable. No mediastinal or hilar masses. Lungs are clear. No convincing pleural  effusion.  No pneumothorax. Skeletal structures are grossly intact. IMPRESSION: No acute cardiopulmonary disease. Electronically Signed   By: Lajean Manes M.D.   On: 10/30/2017 21:35   Ct Head Code Stroke Wo Contrast  Result Date: 10/30/2017 CLINICAL DATA:  Code stroke. Acute presentation with altered mental status. Confusion. EXAM: CT HEAD WITHOUT CONTRAST TECHNIQUE: Contiguous axial images were obtained from  the base of the skull through the vertex without intravenous contrast. COMPARISON:  10/22/2017 CT.  03/22/2011 MRI. FINDINGS: Brain: Generalized atrophy. Extensive chronic small-vessel ischemic changes of the cerebral hemispheric white matter. Previous left frontal craniotomy. Focal atrophy and encephalomalacia at the left frontal vertex. No evidence of acute infarction, mass lesion, hemorrhage, hydrocephalus or extra-axial collection. Vascular: There is atherosclerotic calcification of the major vessels at the base of the brain. Skull: Left frontal craniotomy as noted above. Sinuses/Orbits: Clear Other: None ASPECTS ([place] Stroke Program Early CT Score) - Ganglionic level infarction (caudate, lentiform nuclei, internal capsule, insula, M1-M3 cortex): 7 - Supraganglionic infarction (M4-M6 cortex): 3 Total score (0-10 with 10 being normal): 10 IMPRESSION: 1. No acute finding by CT. Atrophy and chronic small-vessel ischemic changes. Previous left frontal craniotomy with left frontal vertex encephalomalacia. 2. ASPECTS is 10. 3. These results were called by telephone at the time of interpretation on 10/30/2017 at 6:45 pm to Dr. Noemi Chapel , who verbally acknowledged these results. Electronically Signed   By: Nelson Chimes M.D.   On: 10/30/2017 18:47    Discharge Exam: Vitals:   10/31/17 2253 11/01/17 0514  BP: 139/85 (!) 160/88  Pulse: 67 66  Resp: 18 17  Temp: 98.2 F (36.8 C) 98.5 F (36.9 C)  SpO2: 100% 99%   Vitals:   10/31/17 1609 10/31/17 2140 10/31/17 2253 11/01/17 0514  BP: (!) 148/85  139/85 (!) 160/88  Pulse: 70  67 66  Resp: 18  18 17   Temp: 98.1 F (36.7 C)  98.2 F (36.8 C) 98.5 F (36.9 C)  TempSrc: Oral  Oral Oral  SpO2: 99% 95% 100% 99%  Weight:      Height:        General: Pt is alert, awake, not in acute distress Cardiovascular: RRR, S1/S2 +, no rubs, no gallops Respiratory: CTA bilaterally, no wheezing, no rhonchi Abdominal: Soft, NT, ND, bowel sounds + Extremities:  no edema, no cyanosis    The results of significant diagnostics from this hospitalization (including imaging, microbiology, ancillary and laboratory) are listed below for reference.     Microbiology: Recent Results (from the past 240 hour(s))  MRSA PCR Screening     Status: None   Collection Time: 10/30/17 10:20 PM  Result Value Ref Range Status   MRSA by PCR NEGATIVE NEGATIVE Final    Comment:        The GeneXpert MRSA Assay (FDA approved for NASAL specimens only), is one component of a comprehensive MRSA colonization surveillance program. It is not intended to diagnose MRSA infection nor to guide or monitor treatment for MRSA infections. Performed at [facility], [address], [place], [place] [address]      Labs: BNP (last 3 results) No results for input(s): BNP in the last 8760 hours. Basic Metabolic Panel: Recent Labs  Lab 10/30/17 1834 10/30/17 1851 10/31/17 0612 11/01/17 0405  NA 140 139 143 143  K 3.9 4.0 3.6 3.8  CL 104 102 109 109  CO2 28  --  26 27  GLUCOSE 103* 98 114* 107*  BUN 14 12 10 11   CREATININE  1.13* 1.10* 1.00 0.96  CALCIUM 8.8*  --  8.7* 8.8*  MG 2.2  --   --  2.2  PHOS 3.2  --   --   --    Liver Function Tests: Recent Labs  Lab 10/30/17 1834 10/31/17 0612 11/01/17 0405  AST 31 29 29   ALT 31 30 33  ALKPHOS 66 56 61  BILITOT 0.5 0.7 0.5  PROT 7.1 6.5 6.5  ALBUMIN 4.4 3.9 3.8   No results for input(s): LIPASE, AMYLASE in the last 168 hours. Recent Labs  Lab 10/31/17 0612  AMMONIA 12   CBC: Recent Labs  Lab 10/30/17 1834 10/30/17 1851 10/31/17 0612  WBC 11.5*  --  8.1  NEUTROABS 9.4*  --  5.2  HGB 13.1 12.9 12.5  HCT 39.7 38.0 38.6  MCV 91.7  --  91.0  PLT 296  --  247   Cardiac Enzymes: No results for input(s): CKTOTAL, CKMB, CKMBINDEX, TROPONINI in the last 168 hours. BNP: Invalid input(s): POCBNP CBG: Recent Labs  Lab 10/31/17 1135 10/31/17 1617 11/01/17 0006 11/01/17 0508 11/01/17 1103  GLUCAP 88 85  111* 100* 134*   D-Dimer No results for input(s): DDIMER in the last 72 hours. Hgb A1c No results for input(s): HGBA1C in the last 72 hours. Lipid Profile No results for input(s): CHOL, HDL, LDLCALC, TRIG, CHOLHDL, LDLDIRECT in the last 72 hours. Thyroid function studies No results for input(s): TSH, T4TOTAL, T3FREE, THYROIDAB in the last 72 hours.  Invalid input(s): FREET3 Anemia work up No results for input(s): VITAMINB12, FOLATE, FERRITIN, TIBC, IRON, RETICCTPCT in the last 72 hours. Urinalysis    Component Value Date/Time   COLORURINE YELLOW 10/30/2017 2044   APPEARANCEUR CLEAR 10/30/2017 2044   LABSPEC 1.010 10/30/2017 2044   PHURINE 9.0 (H) 10/30/2017 2044   GLUCOSEU NEGATIVE 10/30/2017 2044   HGBUR NEGATIVE 10/30/2017 2044   BILIRUBINUR NEGATIVE 10/30/2017 2044   KETONESUR NEGATIVE 10/30/2017 2044   PROTEINUR NEGATIVE 10/30/2017 2044   UROBILINOGEN 0.2 09/08/2011 1045   NITRITE NEGATIVE 10/30/2017 2044   LEUKOCYTESUR NEGATIVE 10/30/2017 2044   Sepsis Labs Invalid input(s): PROCALCITONIN,  WBC,  LACTICIDVEN Microbiology Recent Results (from the past 240 hour(s))  MRSA PCR Screening     Status: None   Collection Time: 10/30/17 10:20 PM  Result Value Ref Range Status   MRSA by PCR NEGATIVE NEGATIVE Final    Comment:        The GeneXpert MRSA Assay (FDA approved for NASAL specimens only), is one component of a comprehensive MRSA colonization surveillance program. It is not intended to diagnose MRSA infection nor to guide or monitor treatment for MRSA infections. Performed at [facility], [address], [place], [place] [address]      Time coordinating discharge: 45 minutes  SIGNED:   Rodena Goldmann, DO [facility] 11/01/2017, 12:00 PM Pager [phone]  If 7PM-7AM, please contact night-coverage [email or web address] Password [email or web address]

## 2017-11-08 ENCOUNTER — Telehealth: Payer: Self-pay | Admitting: *Deleted

## 2017-11-08 NOTE — Telephone Encounter (Signed)
Spoke with Jamie [name] and she will call [place] with [place].

## 2017-11-08 NOTE — Telephone Encounter (Signed)
Can you get in touch with the lady who comes with her, before the order is cancelled?

## 2017-11-08 NOTE — Telephone Encounter (Signed)
VM received from [facility] w/ [place] We talked on Friday, then she left VM yesterday. Several attempts were made last week to contact Jamie [name] to [place] did not receive returned calls. She tried again was not able to get in touch with her, therefore services will not be able to be rendered.

## 2017-11-09 ENCOUNTER — Encounter: Payer: Medicare Other | Admitting: *Deleted

## 2017-11-18 ENCOUNTER — Other Ambulatory Visit: Payer: Self-pay | Admitting: Physician Assistant

## 2017-11-23 ENCOUNTER — Ambulatory Visit: Payer: Medicare Other | Admitting: Physician Assistant

## 2017-12-01 ENCOUNTER — Encounter: Payer: Self-pay | Admitting: Physician Assistant

## 2017-12-21 ENCOUNTER — Ambulatory Visit: Payer: Medicare Other | Admitting: Physician Assistant

## 2018-01-10 ENCOUNTER — Encounter: Payer: Self-pay | Admitting: Physician Assistant

## 2018-01-10 ENCOUNTER — Ambulatory Visit (INDEPENDENT_AMBULATORY_CARE_PROVIDER_SITE_OTHER): Payer: Medicare Other | Admitting: Physician Assistant

## 2018-01-10 VITALS — BP 134/88 | HR 77 | Temp 98.6°F | Ht 67.0 in | Wt 169.4 lb

## 2018-01-10 DIAGNOSIS — Z85841 Personal history of malignant neoplasm of brain: Secondary | ICD-10-CM

## 2018-01-10 DIAGNOSIS — N183 Chronic kidney disease, stage 3 unspecified: Secondary | ICD-10-CM

## 2018-01-10 DIAGNOSIS — M5126 Other intervertebral disc displacement, lumbar region: Secondary | ICD-10-CM | POA: Diagnosis not present

## 2018-01-10 DIAGNOSIS — R413 Other amnesia: Secondary | ICD-10-CM

## 2018-01-10 DIAGNOSIS — R4182 Altered mental status, unspecified: Secondary | ICD-10-CM

## 2018-01-10 DIAGNOSIS — E785 Hyperlipidemia, unspecified: Secondary | ICD-10-CM | POA: Diagnosis not present

## 2018-01-11 LAB — CBC WITH DIFFERENTIAL/PLATELET
Basophils Absolute: 0.1 10*3/uL (ref 0.0–0.2)
Basos: 1 %
EOS (ABSOLUTE): 0.3 10*3/uL (ref 0.0–0.4)
Eos: 5 %
Hematocrit: 39.8 % (ref 34.0–46.6)
Hemoglobin: 13.2 g/dL (ref 11.1–15.9)
Immature Grans (Abs): 0 10*3/uL (ref 0.0–0.1)
Immature Granulocytes: 0 %
Lymphocytes Absolute: 1.5 10*3/uL (ref 0.7–3.1)
Lymphs: 20 %
MCH: 30 pg (ref 26.6–33.0)
MCHC: 33.2 g/dL (ref 31.5–35.7)
MCV: 91 fL (ref 79–97)
Monocytes Absolute: 0.5 10*3/uL (ref 0.1–0.9)
Monocytes: 7 %
Neutrophils Absolute: 4.8 10*3/uL (ref 1.4–7.0)
Neutrophils: 67 %
Platelets: 290 10*3/uL (ref 150–450)
RBC: 4.4 x10E6/uL (ref 3.77–5.28)
RDW: 12.6 % (ref 12.3–15.4)
WBC: 7.2 10*3/uL (ref 3.4–10.8)

## 2018-01-11 LAB — CMP14+EGFR
ALT: 11 IU/L (ref 0–32)
AST: 17 IU/L (ref 0–40)
Albumin/Globulin Ratio: 2.4 — ABNORMAL HIGH (ref 1.2–2.2)
Albumin: 4.6 g/dL (ref 3.6–4.8)
Alkaline Phosphatase: 68 IU/L (ref 39–117)
BUN/Creatinine Ratio: 13 (ref 12–28)
BUN: 15 mg/dL (ref 8–27)
Bilirubin Total: 0.3 mg/dL (ref 0.0–1.2)
CO2: 26 mmol/L (ref 20–29)
Calcium: 9.7 mg/dL (ref 8.7–10.3)
Chloride: 100 mmol/L (ref 96–106)
Creatinine, Ser: 1.12 mg/dL — ABNORMAL HIGH (ref 0.57–1.00)
GFR calc Af Amer: 60 mL/min/{1.73_m2} (ref >59)
GFR calc non Af Amer: 52 mL/min/{1.73_m2} — ABNORMAL LOW (ref >59)
Globulin, Total: 1.9 g/dL (ref 1.5–4.5)
Glucose: 86 mg/dL (ref 65–99)
Potassium: 4.1 mmol/L (ref 3.5–5.2)
Sodium: 146 mmol/L — ABNORMAL HIGH (ref 134–144)
Total Protein: 6.5 g/dL (ref 6.0–8.5)

## 2018-01-11 LAB — LIPID PANEL
Chol/HDL Ratio: 3.8 ratio (ref 0.0–4.4)
Cholesterol, Total: 186 mg/dL (ref 100–199)
HDL: 49 mg/dL (ref >39)
LDL Calculated: 77 mg/dL (ref 0–99)
Triglycerides: 302 mg/dL — ABNORMAL HIGH (ref 0–149)
VLDL Cholesterol Cal: 60 mg/dL — ABNORMAL HIGH (ref 5–40)

## 2018-01-11 NOTE — Progress Notes (Signed)
BP 134/88   Pulse 77   Temp 98.6 F (37 C) (Oral)   Ht _0  (1.702 m)   Wt 169 lb 6.4 oz (76.8 kg)   BMI 26.53 kg/m    Subjective:    Patient ID: Jamie [name], female    DOB: July 21, 1950, 67 y.o.   MRN: [id number]  HPI: Jamie [name] is a 67 y.o. female presenting on 01/10/2018 for Hyperlipidemia (3 month follow up )  This patient comes in for 30-monthrecheck on her chronic medical conditions which do include hyperlipidemia, status post brain cancer with tumor resection, leg and arm weakness secondary to the surgery, anxiety, hyperlipidemia, memory loss, chronic kidney disease.  All of her medications are reviewed today.  The patient's caregiver is rCarlos Levering phone number [phone] In the past [facility].  She would definitely benefit from a home health aide.  She is very poorly mobile at times.  She has lost a lot of weight and was having very poor caregiving.  This was coming from some family members.  At this time that family has moved out.  So she herself seems to be doing better and RShirlean Mylarreports that she is made a great turnaround.  She is even gained some weight back.  This counts as a face-to-face for her to have evaluation due to her physical and mental restrictions and decompensations.  Past Medical History:  Diagnosis Date  . Brain cancer ([facility]   . Cancer ([facility]   . CKD (chronic kidney disease), stage III ([facility]   . Dementia   . High cholesterol   . Insomnia   . Lung cancer ([facility]   . Osteoporosis    Relevant past medical, surgical, family and social history reviewed and updated as indicated. Interim medical history since our last visit reviewed. Allergies and medications reviewed and updated. DATA REVIEWED: CHART IN EPIC  Family History reviewed for pertinent findings.  Review of Systems  Constitutional: Negative.  Negative for activity change, fatigue and fever.  HENT: Negative.     Eyes: Negative.   Respiratory: Negative.  Negative for cough.   Cardiovascular: Negative.  Negative for chest pain, palpitations and leg swelling.  Gastrointestinal: Negative.  Negative for abdominal pain.  Endocrine: Negative.   Genitourinary: Negative.  Negative for dysuria.  Musculoskeletal: Positive for arthralgias, back pain and gait problem.  Skin: Negative.   Neurological: Positive for weakness. Negative for dizziness.  Psychiatric/Behavioral: Positive for confusion and decreased concentration. Negative for suicidal ideas. The patient is nervous/anxious. The patient is not hyperactive.     Allergies as of 01/10/2018      Reactions   Fosamax [alendronate Sodium] Other (See Comments)   "unknown"   Sulfonamide Derivatives    REACTION: Hives, tongue swells      Medication List        Accurate as of 01/10/18 11:59 PM. Always use your most recent med list.          ALIVE WOMENS ENERGY PO Take 1 tablet by mouth daily.   aspirin EC 81 MG tablet Take 81 mg by mouth daily.   diazepam 10 MG tablet Commonly known as:  VALIUM TAKE 1 TABLET AT BEDTIME AS NEEDED FOR ANXIETY   donepezil 5 MG tablet Commonly known as:  ARICEPT Take 1 tablet (5 mg total) by mouth at bedtime.   folic acid 1 MG tablet Commonly  known as:  FOLVITE Take 1 tablet (1 mg total) by mouth daily.   ibuprofen 800 MG tablet Commonly known as:  ADVIL,MOTRIN Take 1 tablet (800 mg total) by mouth every 8 (eight) hours as needed.   MAGNESIUM PO Take by mouth.   rosuvastatin 10 MG tablet Commonly known as:  CRESTOR TAKE ONE TABLET AT BEDTIME   valACYclovir 1000 MG tablet Commonly known as:  VALTREX Take 2 tabs at onset, then 2 tabs 1 hour later          Objective:    BP 134/88   Pulse 77   Temp 98.6 F (37 C) (Oral)   Ht _0  (1.702 m)   Wt 169 lb 6.4 oz (76.8 kg)   BMI 26.53 kg/m   Allergies  Allergen Reactions  . Fosamax [Alendronate Sodium] Other (See Comments)    "unknown"  .  Sulfonamide Derivatives     REACTION: Hives, tongue swells    Wt Readings from Last 3 Encounters:  01/10/18 169 lb 6.4 oz (76.8 kg)  10/31/17 171 lb 8.3 oz (77.8 kg)  10/24/17 169 lb 12.8 oz (77 kg)    Physical Exam  Constitutional: She appears well-developed and well-nourished.  HENT:  Head: Normocephalic and atraumatic.  Eyes: Pupils are equal, round, and reactive to light. Conjunctivae and EOM are normal.  Cardiovascular: Normal rate, regular rhythm, normal heart sounds and intact distal pulses.  Pulmonary/Chest: Effort normal and breath sounds normal.  Abdominal: Soft. Bowel sounds are normal.  Musculoskeletal:       Lumbar back: She exhibits decreased range of motion and tenderness.  Neurological: She is alert. She has normal reflexes. No cranial nerve deficit or sensory deficit. She exhibits abnormal muscle tone. She displays no seizure activity. Gait abnormal.  Skin: Skin is warm and dry. No rash noted.  Psychiatric: She has a normal mood and affect. Her behavior is normal. Judgment and thought content normal.    Results for orders placed or performed in visit on 01/10/18  CBC with Differential/Platelet  Result Value Ref Range   WBC 7.2 3.4 - 10.8 x10E3/uL   RBC 4.40 3.77 - 5.28 x10E6/uL   Hemoglobin 13.2 11.1 - 15.9 g/dL   Hematocrit 39.8 34.0 - 46.6 %   MCV 91 79 - 97 fL   MCH 30.0 26.6 - 33.0 pg   MCHC 33.2 31.5 - 35.7 g/dL   RDW 12.6 12.3 - 15.4 %   Platelets 290 150 - 450 x10E3/uL   Neutrophils 67 Not Estab. %   Lymphs 20 Not Estab. %   Monocytes 7 Not Estab. %   Eos 5 Not Estab. %   Basos 1 Not Estab. %   Neutrophils Absolute 4.8 1.4 - 7.0 x10E3/uL   Lymphocytes Absolute 1.5 0.7 - 3.1 x10E3/uL   Monocytes Absolute 0.5 0.1 - 0.9 x10E3/uL   EOS (ABSOLUTE) 0.3 0.0 - 0.4 x10E3/uL   Basophils Absolute 0.1 0.0 - 0.2 x10E3/uL   Immature Granulocytes 0 Not Estab. %   Immature Grans (Abs) 0.0 0.0 - 0.1 x10E3/uL  CMP14+EGFR  Result Value Ref Range   Glucose 86 65  - 99 mg/dL   BUN 15 8 - 27 mg/dL   Creatinine, Ser 1.12 (H) 0.57 - 1.00 mg/dL   GFR calc non Af Amer 52 (L) >59 mL/min/1.73   GFR calc Af Amer 60 >59 mL/min/1.73   BUN/Creatinine Ratio 13 12 - 28   Sodium 146 (H) 134 - 144 mmol/L   Potassium 4.1 3.5 -  5.2 mmol/L   Chloride 100 96 - 106 mmol/L   CO2 26 20 - 29 mmol/L   Calcium 9.7 8.7 - 10.3 mg/dL   Total Protein 6.5 6.0 - 8.5 g/dL   Albumin 4.6 3.6 - 4.8 g/dL   Globulin, Total 1.9 1.5 - 4.5 g/dL   Albumin/Globulin Ratio 2.4 (H) 1.2 - 2.2   Bilirubin Total 0.3 0.0 - 1.2 mg/dL   Alkaline Phosphatase 68 39 - 117 IU/L   AST 17 0 - 40 IU/L   ALT 11 0 - 32 IU/L  Lipid panel  Result Value Ref Range   Cholesterol, Total 186 100 - 199 mg/dL   Triglycerides 302 (H) 0 - 149 mg/dL   HDL 49 >39 mg/dL   VLDL Cholesterol Cal 60 (H) 5 - 40 mg/dL   LDL Calculated 77 0 - 99 mg/dL   Chol/HDL Ratio 3.8 0.0 - 4.4 ratio      Assessment & Plan:   1. Hyperlipidemia, unspecified hyperlipidemia type - CBC with Differential/Platelet - CMP14+EGFR - Lipid panel  2. CKD (chronic kidney disease), stage III (HCC) - CBC with Differential/Platelet - CMP14+EGFR - Lipid panel  3. Hx of brain cancer - Ambulatory referral to [place]  4. Herniated intervertebral disc of lumbar spine - Ambulatory referral to Home Health  5. Altered mental status, unspecified altered mental status type - Ambulatory referral to [place]  6. Memory impairment - Ambulatory referral to [place] all other maintenance medications as listed above.  Follow up plan: Return in about 4 months (around 05/12/2018).  Educational handout given for   Terald Sleeper PA-C [facility] [address]  [place], [place] [address] [phone]   01/11/2018, 2:19 PM

## 2018-01-13 ENCOUNTER — Other Ambulatory Visit: Payer: Self-pay | Admitting: Physician Assistant

## 2018-01-13 DIAGNOSIS — E7800 Pure hypercholesterolemia, unspecified: Secondary | ICD-10-CM

## 2018-01-21 ENCOUNTER — Other Ambulatory Visit (HOSPITAL_COMMUNITY): Payer: Medicare Other

## 2018-01-21 ENCOUNTER — Emergency Department (HOSPITAL_COMMUNITY): Payer: Medicare Other

## 2018-01-21 ENCOUNTER — Observation Stay (HOSPITAL_COMMUNITY)
Admission: EM | Admit: 2018-01-21 | Discharge: 2018-01-25 | Disposition: A | Discharge status: Home / Self Care | Payer: Medicare Other | Attending: Internal Medicine | Admitting: Internal Medicine

## 2018-01-21 ENCOUNTER — Encounter (HOSPITAL_COMMUNITY): Payer: Self-pay

## 2018-01-21 ENCOUNTER — Observation Stay (HOSPITAL_COMMUNITY): Payer: Medicare Other

## 2018-01-21 ENCOUNTER — Other Ambulatory Visit: Payer: Self-pay

## 2018-01-21 DIAGNOSIS — G4700 Insomnia, unspecified: Secondary | ICD-10-CM | POA: Diagnosis not present

## 2018-01-21 DIAGNOSIS — Z87891 Personal history of nicotine dependence: Secondary | ICD-10-CM | POA: Insufficient documentation

## 2018-01-21 DIAGNOSIS — I6200 Nontraumatic subdural hemorrhage, unspecified: Secondary | ICD-10-CM | POA: Diagnosis not present

## 2018-01-21 DIAGNOSIS — S065XAA Traumatic subdural hemorrhage with loss of consciousness status unknown, initial encounter: Secondary | ICD-10-CM

## 2018-01-21 DIAGNOSIS — E785 Hyperlipidemia, unspecified: Secondary | ICD-10-CM | POA: Diagnosis not present

## 2018-01-21 DIAGNOSIS — R4781 Slurred speech: Secondary | ICD-10-CM | POA: Diagnosis not present

## 2018-01-21 DIAGNOSIS — Z85841 Personal history of malignant neoplasm of brain: Secondary | ICD-10-CM | POA: Diagnosis not present

## 2018-01-21 DIAGNOSIS — R531 Weakness: Secondary | ICD-10-CM | POA: Diagnosis not present

## 2018-01-21 DIAGNOSIS — G9340 Encephalopathy, unspecified: Secondary | ICD-10-CM | POA: Diagnosis not present

## 2018-01-21 DIAGNOSIS — N183 Chronic kidney disease, stage 3 unspecified: Secondary | ICD-10-CM | POA: Diagnosis present

## 2018-01-21 DIAGNOSIS — S065X9A Traumatic subdural hemorrhage with loss of consciousness of unspecified duration, initial encounter: Secondary | ICD-10-CM | POA: Diagnosis not present

## 2018-01-21 DIAGNOSIS — Z85118 Personal history of other malignant neoplasm of bronchus and lung: Secondary | ICD-10-CM | POA: Diagnosis not present

## 2018-01-21 DIAGNOSIS — R4182 Altered mental status, unspecified: Secondary | ICD-10-CM | POA: Diagnosis present

## 2018-01-21 DIAGNOSIS — F0390 Unspecified dementia without behavioral disturbance: Secondary | ICD-10-CM | POA: Insufficient documentation

## 2018-01-21 DIAGNOSIS — Z79899 Other long term (current) drug therapy: Secondary | ICD-10-CM | POA: Diagnosis not present

## 2018-01-21 DIAGNOSIS — I808 Phlebitis and thrombophlebitis of other sites: Secondary | ICD-10-CM | POA: Insufficient documentation

## 2018-01-21 DIAGNOSIS — R413 Other amnesia: Secondary | ICD-10-CM | POA: Diagnosis present

## 2018-01-21 DIAGNOSIS — Z7982 Long term (current) use of aspirin: Secondary | ICD-10-CM | POA: Insufficient documentation

## 2018-01-21 DIAGNOSIS — R404 Transient alteration of awareness: Secondary | ICD-10-CM | POA: Diagnosis not present

## 2018-01-21 DIAGNOSIS — J9811 Atelectasis: Secondary | ICD-10-CM | POA: Diagnosis not present

## 2018-01-21 LAB — DIFFERENTIAL
Basophils Absolute: 0 10*3/uL (ref 0.0–0.1)
Basophils Relative: 0 %
Eosinophils Absolute: 0.1 10*3/uL (ref 0.0–0.7)
Eosinophils Relative: 0 %
Lymphocytes Relative: 13 %
Lymphs Abs: 1.7 10*3/uL (ref 0.7–4.0)
Monocytes Absolute: 0.6 10*3/uL (ref 0.1–1.0)
Monocytes Relative: 5 %
Neutro Abs: 11 10*3/uL — ABNORMAL HIGH (ref 1.7–7.7)
Neutrophils Relative %: 82 %

## 2018-01-21 LAB — COMPREHENSIVE METABOLIC PANEL
ALT: 15 U/L (ref 0–44)
AST: 20 U/L (ref 15–41)
Albumin: 5 g/dL (ref 3.5–5.0)
Alkaline Phosphatase: 57 U/L (ref 38–126)
Anion gap: 8 (ref 5–15)
BUN: 11 mg/dL (ref 8–23)
CO2: 27 mmol/L (ref 22–32)
Calcium: 9.4 mg/dL (ref 8.9–10.3)
Chloride: 107 mmol/L (ref 98–111)
Creatinine, Ser: 1.15 mg/dL — ABNORMAL HIGH (ref 0.44–1.00)
GFR calc Af Amer: 57 mL/min — ABNORMAL LOW (ref >60)
GFR calc non Af Amer: 49 mL/min — ABNORMAL LOW (ref >60)
Glucose, Bld: 119 mg/dL — ABNORMAL HIGH (ref 70–99)
Potassium: 4.4 mmol/L (ref 3.5–5.1)
Sodium: 142 mmol/L (ref 135–145)
Total Bilirubin: 0.6 mg/dL (ref 0.3–1.2)
Total Protein: 7.7 g/dL (ref 6.5–8.1)

## 2018-01-21 LAB — RAPID URINE DRUG SCREEN, HOSP PERFORMED
Amphetamines: NOT DETECTED
Barbiturates: NOT DETECTED
Benzodiazepines: POSITIVE — AB
Cocaine: NOT DETECTED
Opiates: NOT DETECTED
Tetrahydrocannabinol: NOT DETECTED

## 2018-01-21 LAB — CBC
HCT: 42.1 % (ref 36.0–46.0)
Hemoglobin: 14.1 g/dL (ref 12.0–15.0)
MCH: 30.9 pg (ref 26.0–34.0)
MCHC: 33.5 g/dL (ref 30.0–36.0)
MCV: 92.1 fL (ref 78.0–100.0)
Platelets: 285 10*3/uL (ref 150–400)
RBC: 4.57 MIL/uL (ref 3.87–5.11)
RDW: 12.7 % (ref 11.5–15.5)
WBC: 13.4 10*3/uL — ABNORMAL HIGH (ref 4.0–10.5)

## 2018-01-21 LAB — I-STAT CHEM 8, ED
BUN: 10 mg/dL (ref 8–23)
Calcium, Ion: 1.16 mmol/L (ref 1.15–1.40)
Chloride: 104 mmol/L (ref 98–111)
Creatinine, Ser: 1.3 mg/dL — ABNORMAL HIGH (ref 0.44–1.00)
Glucose, Bld: 114 mg/dL — ABNORMAL HIGH (ref 70–99)
HCT: 40 % (ref 36.0–46.0)
Hemoglobin: 13.6 g/dL (ref 12.0–15.0)
Potassium: 4.2 mmol/L (ref 3.5–5.1)
Sodium: 140 mmol/L (ref 135–145)
TCO2: 25 mmol/L (ref 22–32)

## 2018-01-21 LAB — URINALYSIS, ROUTINE W REFLEX MICROSCOPIC
Bilirubin Urine: NEGATIVE
Glucose, UA: NEGATIVE mg/dL
Hgb urine dipstick: NEGATIVE
Ketones, ur: NEGATIVE mg/dL
Leukocytes, UA: NEGATIVE
Nitrite: NEGATIVE
Protein, ur: NEGATIVE mg/dL
Specific Gravity, Urine: 1.015 (ref 1.005–1.030)
pH: 7 (ref 5.0–8.0)

## 2018-01-21 LAB — APTT: aPTT: 35 seconds (ref 24–36)

## 2018-01-21 LAB — I-STAT TROPONIN, ED: Troponin i, poc: 0.01 ng/mL (ref 0.00–0.08)

## 2018-01-21 LAB — AMMONIA: Ammonia: 14 umol/L (ref 9–35)

## 2018-01-21 LAB — PROTIME-INR
INR: 1.14
Prothrombin Time: 14.5 seconds (ref 11.4–15.2)

## 2018-01-21 LAB — TSH: TSH: 0.473 u[IU]/mL (ref 0.350–4.500)

## 2018-01-21 LAB — VITAMIN B12: Vitamin B-12: 295 pg/mL (ref 180–914)

## 2018-01-21 LAB — ETHANOL: Alcohol, Ethyl (B): <10 mg/dL (ref <10)

## 2018-01-21 MED ORDER — LORAZEPAM 2 MG/ML IJ SOLN
1.0000 mg | Freq: Four times a day (QID) | INTRAMUSCULAR | Status: DC | PRN
  Administered 2018-01-24: 1 mg via INTRAVENOUS
  Filled 2018-01-21: qty 1

## 2018-01-21 MED ORDER — DONEPEZIL HCL 5 MG PO TABS
5.0000 mg | ORAL_TABLET | Freq: Every day | ORAL | Status: DC
  Administered 2018-01-21 – 2018-01-24 (×4): 5 mg via ORAL
  Filled 2018-01-21 (×4): qty 1

## 2018-01-21 MED ORDER — SODIUM CHLORIDE 0.9% FLUSH
3.0000 mL | Freq: Two times a day (BID) | INTRAVENOUS | Status: DC
  Administered 2018-01-21 – 2018-01-25 (×6): 3 mL via INTRAVENOUS

## 2018-01-21 MED ORDER — ASPIRIN EC 81 MG PO TBEC
81.0000 mg | DELAYED_RELEASE_TABLET | Freq: Every day | ORAL | Status: DC
  Administered 2018-01-21: 81 mg via ORAL
  Filled 2018-01-21: qty 1

## 2018-01-21 MED ORDER — ACETAMINOPHEN 650 MG RE SUPP: 650.0000 mg | Freq: Four times a day (QID) | RECTAL | Status: DC | PRN

## 2018-01-21 MED ORDER — FOLIC ACID 1 MG PO TABS
1.0000 mg | ORAL_TABLET | Freq: Every day | ORAL | Status: DC
  Administered 2018-01-21 – 2018-01-25 (×5): 1 mg via ORAL
  Filled 2018-01-21 (×5): qty 1

## 2018-01-21 MED ORDER — SODIUM CHLORIDE 0.9 % IV SOLN
INTRAVENOUS | Status: DC
  Administered 2018-01-21: 06:00:00 via INTRAVENOUS

## 2018-01-21 MED ORDER — ACETAMINOPHEN 325 MG PO TABS
650.0000 mg | ORAL_TABLET | Freq: Four times a day (QID) | ORAL | Status: DC | PRN
  Administered 2018-01-21: 650 mg via ORAL
  Filled 2018-01-21: qty 2

## 2018-01-21 MED ORDER — ONDANSETRON HCL 4 MG/2ML IJ SOLN: 4.0000 mg | Freq: Four times a day (QID) | INTRAMUSCULAR | Status: DC | PRN

## 2018-01-21 MED ORDER — ENOXAPARIN SODIUM 40 MG/0.4ML ~~LOC~~ SOLN
40.0000 mg | SUBCUTANEOUS | Status: DC
  Administered 2018-01-21: 40 mg via SUBCUTANEOUS
  Filled 2018-01-21: qty 0.4

## 2018-01-21 MED ORDER — ROSUVASTATIN CALCIUM 10 MG PO TABS
10.0000 mg | ORAL_TABLET | Freq: Every day | ORAL | Status: DC
  Administered 2018-01-21 – 2018-01-25 (×5): 10 mg via ORAL
  Filled 2018-01-21: qty 2
  Filled 2018-01-21: qty 1
  Filled 2018-01-21: qty 2
  Filled 2018-01-21 (×2): qty 1
  Filled 2018-01-21 (×2): qty 2
  Filled 2018-01-21 (×2): qty 1
  Filled 2018-01-21 (×2): qty 2

## 2018-01-21 MED ORDER — ONDANSETRON HCL 4 MG PO TABS: 4.0000 mg | ORAL_TABLET | Freq: Four times a day (QID) | ORAL | Status: DC | PRN

## 2018-01-21 NOTE — ED Notes (Signed)
[phone]- Jamie [name]

## 2018-01-21 NOTE — ED Triage Notes (Signed)
Per EMS son's LKW was 5-530pm. Son came back this morning and found pt unresponsive, incont of urine, with left sided deficits, unable to follow commands  VSS. 114 FSBS. With EMS. 20g R AC. Son said pt started new sleeping medication unsure of what medication.

## 2018-01-21 NOTE — ED Notes (Signed)
Unable to perform stroke scale due to pt dementia unable to follow commands.

## 2018-01-21 NOTE — Evaluation (Signed)
Clinical/Bedside Swallow Evaluation Patient Details  Name: SHARLETT LIENEMANN MRN: [id number] Date of Birth: 1950/10/14  Today's Date: 01/21/2018 Time: SLP Start Time (ACUTE ONLY): 1037 SLP Stop Time (ACUTE ONLY): 1045 SLP Time Calculation (min) (ACUTE ONLY): 8 min  Past Medical History:  Past Medical History:  Diagnosis Date  . Brain cancer ([place])   . Cancer ([place])   . CKD (chronic kidney disease), stage III ([place])   . Dementia ([place])   . High cholesterol   . Insomnia   . Lung cancer ([place])   . Osteoporosis    Past Surgical History:  Past Surgical History:  Procedure Laterality Date  . ABDOMINAL HYSTERECTOMY  1986  . BRAIN SURGERY  2001   TUMOR REMOVAL  . catarac Bilateral   . EYE SURGERY    . LOBECTOMY     HPI:  Pt is a 67 y.o. female with medical history significant for lung cancer with brain metastasis status post remote craniotomy and resection, memory loss, chronic kidney disease stage III, and insomnia, who presented to the emergency department for evaluation of somnolence, confusion, and possible left-sided weakness. CT Head negative; MRI pending. Swallowing appeared functional during BSE in July 2019, with regular diet and thin liquids recommended at that time despite AMS.   Assessment / Plan / Recommendation Clinical Impression  Despite altered mentation, pt's oropharyngeal swallow appears functional without overt signs of aspiration. Recommend starting regular diet textures and thin liquids. SLP to f/u briefly for tolerance as additional medical w/u is underway given cognitive status and difficulty observed during initial swallow screen. SLP Visit Diagnosis: Dysphagia, unspecified (R13.10)    Aspiration Risk  Mild aspiration risk    Diet Recommendation Regular;Thin liquid   Liquid Administration via: Cup;Straw Medication Administration: Whole meds with liquid Supervision: Patient able to self feed;Intermittent supervision to cue for compensatory strategies Compensations:  Slow rate;Small sips/bites Postural Changes: Seated upright at 90 degrees    Other  Recommendations Oral Care Recommendations: Oral care BID   Follow up Recommendations (tba)      Frequency and Duration min 1 x/week  1 week       Prognosis        Swallow Study   General HPI: Pt is a 67 y.o. female with medical history significant for lung cancer with brain metastasis status post remote craniotomy and resection, memory loss, chronic kidney disease stage III, and insomnia, who presented to the emergency department for evaluation of somnolence, confusion, and possible left-sided weakness. CT Head negative; MRI pending. Swallowing appeared functional during BSE in July 2019, with regular diet and thin liquids recommended at that time despite AMS. Type of Study: Bedside Swallow Evaluation Previous Swallow Assessment: see HPI Diet Prior to this Study: NPO Temperature Spikes Noted: No Respiratory Status: Room air History of Recent Intubation: No Behavior/Cognition: Alert;Cooperative;Requires cueing;Distractible Oral Cavity Assessment: Within Functional Limits Oral Care Completed by SLP: No Oral Cavity - Dentition: Adequate natural dentition Vision: Functional for self-feeding Self-Feeding Abilities: Able to feed self Patient Positioning: Upright in bed Baseline Vocal Quality: Normal Volitional Cough: Weak Volitional Swallow: Able to elicit    Oral/Motor/Sensory Function Overall Oral Motor/Sensory Function: Within functional limits   Ice Chips Ice chips: Not tested   Thin Liquid Thin Liquid: Within functional limits Presentation: Cup;Self Fed;Straw    Nectar Thick Nectar Thick Liquid: Not tested   Honey Thick Honey Thick Liquid: Not tested   Puree Puree: Within functional limits Presentation: Self Fed;Spoon   Solid     Solid: Within  functional limits Presentation: Self Suzette Battiest, Mickel Baas 01/21/2018,11:23 AM   [name], M.A. [facility] therapist [phone] Office [phone]

## 2018-01-21 NOTE — ED Notes (Signed)
Pt did not pass swallow eval. Pt starting coughing while drinking water.

## 2018-01-21 NOTE — Progress Notes (Addendum)
PROGRESS NOTE  Jamie [name] ZLD:[id number] DOB: [birth year] DOA: 01/21/2018 PCP: Terald Sleeper, PA-C  Brief Narrative: 67 year old woman lives alone, PMH lung cancer with brain metastasis, status post craniotomy, son found her acutely confused early this a.m., EMS thought there might of been a left-sided deficit and called a code stroke, on arrival to the ED, patient was confused but there were no focal findings documented.  She was last seen normal greater than 8 hours and was therefore not a candidate for TPA.  Son reported patient had started a new sleeping medication (in addition to Valium) and it was thought that this would account for her mental status changes.  Had a similar presentation in July.  Admitted for acute encephalopathy.  Assessment/Plan Acute encephalopathy, no focal deficits noted other than some dysarthria.  Head CT no acute findings. Hospitalized July 2019, underwent MRI brain, EEG which were grossly unremarkable and patient gradually returned to baseline without any specific etiology identified. --May be drug related, Valium or unknown sleep aid patient receives from a friend --Check MRI brain if negative, no further evaluation planned at this time --Neurochecks, follow-up laboratory studies drawn at admission  CKD stage III --appears stable  PMH lung cancer with brain metastasis, status post craniotomy  Memory loss managed by neurology at [facility]. At baseline walks with a cane, lives alone her son, oriented to person, place and situation. --Continue Aricept  DVT prophylaxis: enoxaparin Code Status: Full Family Communication: none Disposition Plan: likely return to home    Murray Hodgkins, MD  [facility] Direct contact: [phone] --Via amion app OR  --[email or web address]; password [email or web address]  7PM-7AM contact night coverage as above 01/21/2018, 2:49 PM  LOS: 0 days   Consultants:    Procedures:    Antimicrobials:    Interval  history/Subjective: Seen by speech therapist, noted to be slow to respond with limited spontaneous verbal output.  Regular diet, thin liquids recommended.  Feels ok. "I don't know how I got here".  Objective: Vitals:  Vitals:   01/21/18 0903 01/21/18 1247  BP: 106/66 109/64  Pulse: 75 69  Resp: 14 16  Temp: (!) 97.5 F (36.4 C) 97.9 F (36.6 C)  SpO2: (!) 89% 100%    Exam:  Constitutional:  . Appears calm and comfortable Eyes:  . pupils and irises appear normal . Normal lids  ENMT:  . grossly normal hearing  . Lips appear normal Respiratory:  . CTA bilaterally, no w/r/r.  . Respiratory effort normal.  Cardiovascular:  . RRR, no m/r/g . No LE extremity edema   Musculoskeletal:  . RUE, LUE, RLE, LLE   o strength and tone normal, no atrophy, no abnormal movements Neurologic:  . Face appears symmetric . No UE dysdiadokinesis . No pronator drift Psychiatric:  . Mental status o Mood appears appropriate, affect odd/confused . Orientation to person, hospital, month, "1919"   I have personally reviewed the following:   Data:  Labs: Creatinine 1.15, potassium within normal limits, LFTs unremarkable, troponin, vitamin B12, ammonia unremarkable.  CBC with minimal leukocytosis 13.4, otherwise unremarkable.  Urinalysis negative.  Urine drug screen positive for benzodiazepines.  TSH within normal limits.  Imaging: CT head no acute disease.  Chest x-ray independently reviewed, no acute disease.  Other: EKG sinus rhythm, independently reviewed   Scheduled Meds: . aspirin EC  81 mg Oral Daily  . donepezil  5 mg Oral QHS  . enoxaparin (LOVENOX) injection  40 mg Subcutaneous Q24H  . folic acid  1 mg Oral  Daily  . rosuvastatin  10 mg Oral q1800  . sodium chloride flush  3 mL Intravenous Q12H   Continuous Infusions:   Principal Problem:   Acute encephalopathy Active Problems:   Hx of brain cancer   Memory loss   CKD (chronic kidney disease), stage III ([place])    LOS: 0 days   Time 4540-9811, chart review, exam of patient

## 2018-01-21 NOTE — ED Notes (Signed)
Report given to RN at [facility].

## 2018-01-21 NOTE — H&P (Signed)
History and Physical    Jamie [name] EPP:[id number] DOB: 07-01-1950 DOA: 01/21/2018  PCP: Terald Sleeper, PA-C   Patient coming from: Home   Chief Complaint: Poorly responsive, confused, left-sided weakness   HPI: Jamie [name] is a 67 y.o. female with medical history significant for lung cancer with brain metastasis status post remote craniotomy and resection, memory loss, chronic kidney disease stage III, and insomnia, now presenting to the emergency department for evaluation of somnolence, confusion, and possible left-sided weakness.  Patient was reportedly in her usual state at approximately 5 PM on 01/20/2018 and then noted by her son at approximately 2 AM to be somnolent, poorly responsive, and confused.  EMS was called and they report some left-sided weakness.  No complaints or recent illness reported.  Patient takes 10 mg of Valium nightly and her son reported that she may be taking another sleep aid that she gets from a friend.  Patient had a similar presentation in July, was admitted at that time and underwent MRI and EEG, and she gradually returned to baseline without any specific etiology identified. At baseline, patient walks with a cane, lives alone near her son, and is oriented to person, place, and situation, and able to state her birth date.   ED Course: Upon arrival to the ED, patient is found to be afebrile, saturating well on room air, and with vitals otherwise normal.  EKG features a sinus rhythm, noncontrast head CT is negative for acute findings, and chest x-ray is notable for mild bibasilar atelectasis only.  Chemistry panel features a mild chronic renal insufficiency and CBC is normal.  Urinalysis is unremarkable and UDS is positive for benzodiazepines only.  Patient remains somnolent, is easily woken, but very slow to respond and unable to answer basic questions appropriately.  She will be observed on the telemetry unit for ongoing evaluation and management of acute  encephalopathy with reported left-sided weakness prior to arrival.  Review of Systems:  All other systems reviewed and apart from HPI, are negative.  Past Medical History:  Diagnosis Date  . Brain cancer ([place])   . Cancer ([place])   . CKD (chronic kidney disease), stage III ([place])   . Dementia ([place])   . High cholesterol   . Insomnia   . Lung cancer ([place])   . Osteoporosis     Past Surgical History:  Procedure Laterality Date  . ABDOMINAL HYSTERECTOMY  1986  . BRAIN SURGERY  2001   TUMOR REMOVAL  . catarac Bilateral   . EYE SURGERY    . LOBECTOMY       reports that she quit smoking about 18 years ago. Her smoking use included cigarettes. She quit after 33.00 years of use. She has never used smokeless tobacco. She reports that she drinks about 3.0 standard drinks of alcohol per week. She reports that she does not use drugs.  Allergies  Allergen Reactions  . Fosamax [Alendronate Sodium] Other (See Comments)    "unknown"  . Sulfonamide Derivatives     REACTION: Hives, tongue swells    Family History  Problem Relation Age of Onset  . Cancer Mother        cirrhois of liver  . Heart attack Father   . Colon cancer Father   . Asthma Brother      Prior to Admission medications   Medication Sig Start Date End Date Taking? Authorizing Provider  aspirin EC 81 MG tablet Take 81 mg by mouth daily.  [provider]  diazepam (VALIUM) 10 MG tablet TAKE 1 TABLET AT BEDTIME AS NEEDED FOR ANXIETY 11/21/17   Terald Sleeper, PA-C  donepezil (ARICEPT) 5 MG tablet Take 1 tablet (5 mg total) by mouth at bedtime. 11/01/17 11/01/18  Manuella Ghazi, Pratik D, DO  folic acid (FOLVITE) 1 MG tablet Take 1 tablet (1 mg total) by mouth daily. 02/16/17   Terald Sleeper, PA-C  ibuprofen (ADVIL,MOTRIN) 800 MG tablet Take 1 tablet (800 mg total) by mouth every 8 (eight) hours as needed. 02/16/17   Terald Sleeper, PA-C  MAGNESIUM PO Take by mouth.    [provider]  Multiple Vitamins-Minerals  (ALIVE WOMENS ENERGY PO) Take 1 tablet by mouth daily.    [provider]  rosuvastatin (CRESTOR) 10 MG tablet TAKE ONE TABLET AT BEDTIME 01/13/18   Terald Sleeper, PA-C  valACYclovir (VALTREX) 1000 MG tablet Take 2 tabs at onset, then 2 tabs 1 hour later 08/03/16   Terald Sleeper, PA-C    Physical Exam: Vitals:   01/21/18 0230 01/21/18 0233  BP: 136/83   Pulse: 92   Resp: 17   Temp:  98.5 F (36.9 C)  TempSrc:  Oral  SpO2: 97%   Weight:  76 kg  Height:  5\' 7"  (1.702 m)     Constitutional: Sleeping, easily woken, comfortable Eyes: PERTLA, lids and conjunctivae normal ENMT: Mucous membranes are moist. Posterior pharynx clear of any exudate or lesions.   Neck: normal, supple, no masses, no thyromegaly Respiratory: clear to auscultation bilaterally, no wheezing, no crackles. Normal respiratory effort.    Cardiovascular: S1 & S2 heard, regular rate and rhythm. No extremity edema.  Abdomen: No distension, no tenderness, soft. Bowel sounds normal.  Musculoskeletal: no clubbing / cyanosis. No joint deformity upper and lower extremities.    Skin: no significant rashes, lesions, ulcers. Warm, dry, well-perfused. Neurologic: No facial asymmetry. Sensation intact. Moving all extremities spontaneously.  Psychiatric: Somnolent, makes eye-contact. Very slow to respond to basic questioning and unable to provide coherent response.    Labs on Admission: I have personally reviewed following labs and imaging studies  CBC: Recent Labs  Lab 01/21/18 0223 01/21/18 0240  WBC 13.4*  --   NEUTROABS 11.0*  --   HGB 14.1 13.6  HCT 42.1 40.0  MCV 92.1  --   PLT 285  --    Basic Metabolic Panel: Recent Labs  Lab 01/21/18 0223 01/21/18 0240  NA 142 140  K 4.4 4.2  CL 107 104  CO2 27  --   GLUCOSE 119* 114*  BUN 11 10  CREATININE 1.15* 1.30*  CALCIUM 9.4  --    GFR: Estimated Creatinine Clearance: 45.9 mL/min (A) (by C-G formula based on SCr of 1.3 mg/dL (H)). Liver Function  Tests: Recent Labs  Lab 01/21/18 0223  AST 20  ALT 15  ALKPHOS 57  BILITOT 0.6  PROT 7.7  ALBUMIN 5.0   No results for input(s): LIPASE, AMYLASE in the last 168 hours. No results for input(s): AMMONIA in the last 168 hours. Coagulation Profile: Recent Labs  Lab 01/21/18 0223  INR 1.14   Cardiac Enzymes: No results for input(s): CKTOTAL, CKMB, CKMBINDEX, TROPONINI in the last 168 hours. BNP (last 3 results) No results for input(s): PROBNP in the last 8760 hours. HbA1C: No results for input(s): HGBA1C in the last 72 hours. CBG: No results for input(s): GLUCAP in the last 168 hours. Lipid Profile: No results for input(s): CHOL, HDL, LDLCALC, TRIG,  CHOLHDL, LDLDIRECT in the last 72 hours. Thyroid Function Tests: No results for input(s): TSH, T4TOTAL, FREET4, T3FREE, THYROIDAB in the last 72 hours. Anemia Panel: No results for input(s): VITAMINB12, FOLATE, FERRITIN, TIBC, IRON, RETICCTPCT in the last 72 hours. Urine analysis:    Component Value Date/Time   COLORURINE YELLOW 01/21/2018 0424   APPEARANCEUR CLOUDY (A) 01/21/2018 0424   LABSPEC 1.015 01/21/2018 0424   PHURINE 7.0 01/21/2018 0424   GLUCOSEU NEGATIVE 01/21/2018 0424   HGBUR NEGATIVE 01/21/2018 0424   BILIRUBINUR NEGATIVE 01/21/2018 0424   KETONESUR NEGATIVE 01/21/2018 0424   PROTEINUR NEGATIVE 01/21/2018 0424   UROBILINOGEN 0.2 09/08/2011 1045   NITRITE NEGATIVE 01/21/2018 0424   LEUKOCYTESUR NEGATIVE 01/21/2018 0424   Sepsis Labs: @LABRCNTIP (procalcitonin:4,lacticidven:4) )No results found for this or any previous visit (from the past 240 hour(s)).   Radiological Exams on Admission: Ct Head Wo Contrast  Result Date: 01/21/2018 CLINICAL DATA:  Acute mental status change. EXAM: CT HEAD WITHOUT CONTRAST TECHNIQUE: Contiguous axial images were obtained from the base of the skull through the vertex without intravenous contrast. COMPARISON:  October 30, 2017 CT scan FINDINGS: Brain: No subdural, epidural, or  subarachnoid hemorrhage. Encephalomalacia at the left frontal vertex is stable. Cerebellum, brainstem, and basal cisterns are normal. Ventricles and sulci are unremarkable. White matter changes are stable. No acute cortical ischemia infarct. No mass effect or midline shift. Vascular: No hyperdense vessel or unexpected calcification. Skull: Previous left craniotomy at the vertex frontally. Sinuses/Orbits: Opacification of left mastoid air cells posteriorly and inferiorly are stable. Paranasal sinuses, mastoid air cells, and middle ears are otherwise normal. Other: None. IMPRESSION: No acute intracranial abnormalities identified. Chronic white matter changes and left frontal vertex encephalomalacia. Electronically Signed   By: Dorise Bullion III M.D   On: 01/21/2018 02:35   Dg Chest Port 1 View  Result Date: 01/21/2018 CLINICAL DATA:  Mental status change. EXAM: PORTABLE CHEST 1 VIEW COMPARISON:  10/30/2017 FINDINGS: The cardiomediastinal contours are unchanged with aortic atherosclerosis and tortuosity. Minor basilar atelectasis. Pulmonary vasculature is normal. No consolidation, pleural effusion, or pneumothorax. No acute osseous abnormalities are seen. IMPRESSION: Mild bibasilar atelectasis. Electronically Signed   By: Keith Rake M.D.   On: 01/21/2018 04:45    EKG: Independently reviewed. Sinus rhythm.   Assessment/Plan   1. Acute encephalopathy  - Found by son to be somnolent and disoriented, had left-sided weakness with EMS  - She remains somnolent and disoriented in ED, no focal weakness appreciated but difficult exam  - Head CT with no acute findings and ED workup otherwise unremarkable  - She had a similar presentation in July with gradual return to baseline and no specific etiology identified  - She takes Valium 10 mg qHS and may be taking a sleep-aid that she gets from a friend per report of son  - Suspect this is medication-related in pt with baseline memory-loss and hx of brain  tumor resection, though with reported left-sided weakness with EMS, CVA difficult to exclude  - Continue cardiac monitoring and frequent neuro checks, check MRI brain, check TSH, B12, folate, ammonia, and RPR   2. Memory loss  - Follows with neurology at [facility], managed with Aricept  - Continue Aricept    3. CKD stage III  - SCr is 1.15 on admission, similar to priors    4. Insomnia  - Managed with Valium at home, held on admission in light of somnolence and disorientation     DVT prophylaxis: Lovenox  Code Status: Full  Family Communication: Discussed with patient  Consults called: None Admission status: Observation     Vianne Bulls, MD [facility] Pager [phone]  If 7PM-7AM, please contact night-coverage [email or web address] Password [email or web address]  01/21/2018, 5:20 AM

## 2018-01-21 NOTE — Evaluation (Signed)
Physical Therapy Evaluation Patient Details Name: Jamie [name] MRN: [id number] DOB: 1950-04-27 Today's Date: 01/21/2018   History of Present Illness  Pt is a 67 y.o. female with medical history significant for lung cancer with brain metastasis status post remote craniotomy and resection, memory loss, chronic kidney disease stage III, and insomnia, who presented to the emergency department for evaluation of somnolence, confusion, and possible left-sided weakness. CT Head negative; MRI pending.  Clinical Impression  Orders received for PT evaluation. Patient demonstrates deficits in functional mobility as indicated below. Will benefit from continued skilled PT to address deficits and maximize function. Will see as indicated and progress as tolerated.  At this time, feel patient would be unsafe to return home alone, recommend ST SNF upon acute discharge vs home with family providing 24/7 physical assist pending improvements in status.    Follow Up Recommendations SNF;Supervision/Assistance - 24 hour(vs home with family providing 24/7 assist if status improves)    Equipment Recommendations  None recommended by PT    Recommendations for Other Services       Precautions / Restrictions Precautions Precautions: Fall      Mobility  Bed Mobility Overal bed mobility: Needs Assistance Bed Mobility: Supine to Sit;Sit to Supine     Supine to sit: Min assist Sit to supine: Min assist   General bed mobility comments: Min assist to elevate trunk to upright at EOB, min assist to elevate LEs back to bed and reposition  Transfers Overall transfer level: Needs assistance Equipment used: 1 person hand held assist Transfers: Sit to/from Stand Sit to Stand: Min assist         General transfer comment: min assist for stability during power up to standing  Ambulation/Gait Ambulation/Gait assistance: Min assist Gait Distance (Feet): 22 Feet Assistive device: 1 person hand held assist Gait  Pattern/deviations: Ataxic;Shuffle;Decreased stride length;Step-to pattern;Drifts right/left Gait velocity: decreased Gait velocity interpretation: <1.31 ft/sec, indicative of household ambulator General Gait Details: patient required hands on assist for stability, noted decreased cadence and poor facilitation of recipricol gait. Multi modal cues for posture and pacing  Stairs            Wheelchair Mobility    Modified Rankin (Stroke Patients Only)       Balance Overall balance assessment: History of Falls                                           Pertinent Vitals/Pain Pain Assessment: 0-10 Faces Pain Scale: No hurt    Home Living Family/patient expects to be discharged to:: Private residence Living Arrangements: Children(son) Available Help at Discharge: Family Type of Home: Mobile home Home Access: Stairs to enter Entrance Stairs-Rails: Right Entrance Stairs-Number of Steps: 4-5 Home Layout: One level Home Equipment: Walker - 2 wheels;Shower seat;Cane - single point      Prior Function Level of Independence: Needs assistance   Gait / Transfers Assistance Needed: household ambulator without AD per patient  ADL's / Homemaking Assistance Needed: assisted by family        Hand Dominance   Dominant Hand: Right    Extremity/Trunk Assessment   Upper Extremity Assessment Upper Extremity Assessment: Generalized weakness    Lower Extremity Assessment Lower Extremity Assessment: Generalized weakness       Communication   Communication: HOH(some word finding difficulties during session)  Cognition Arousal/Alertness: Awake/alert Behavior During Therapy: WFL for tasks assessed/performed  Overall Cognitive Status: No family/caregiver present to determine baseline cognitive functioning                                        General Comments      Exercises     Assessment/Plan    PT Assessment Patient needs continued PT  services  PT Problem List Decreased strength;Decreased balance;Decreased activity tolerance;Decreased mobility;Decreased coordination;Decreased cognition;Decreased safety awareness       PT Treatment Interventions DME instruction;Gait training;Stair training;Functional mobility training;Balance training;Therapeutic exercise;Therapeutic activities;Neuromuscular re-education;Cognitive remediation;Patient/family education    PT Goals (Current goals can be found in the Care Plan section)  Acute Rehab PT Goals Patient Stated Goal: to get home PT Goal Formulation: With patient Time For Goal Achievement: 02/04/18 Potential to Achieve Goals: Good    Frequency Min 3X/week   Barriers to discharge Decreased caregiver support      Co-evaluation               AM-PAC PT "6 Clicks" Daily Activity  Outcome Measure Difficulty turning over in bed (including adjusting bedclothes, sheets and blankets)?: A Little Difficulty moving from lying on back to sitting on the side of the bed? : Unable Difficulty sitting down on and standing up from a chair with arms (e.g., wheelchair, bedside commode, etc,.)?: Unable Help needed moving to and from a bed to chair (including a wheelchair)?: A Little Help needed walking in hospital room?: A Little Help needed climbing 3-5 steps with a railing? : A Lot 6 Click Score: 13    End of Session Equipment Utilized During Treatment: Gait belt;Oxygen Activity Tolerance: Patient limited by fatigue Patient left: in bed;with call bell/phone within reach;with bed alarm set;with SCD's reapplied Nurse Communication: Mobility status PT Visit Diagnosis: Unsteadiness on feet (R26.81);Difficulty in walking, not elsewhere classified (R26.2);Ataxic gait (R26.0)    Time: 9563-8756 PT Time Calculation (min) (ACUTE ONLY): 20 min   Charges:   PT Evaluation $PT Eval Moderate Complexity: 1 Mod          Alben Deeds, PT DPT  Board Certified Neurologic Specialist Acute  Rehabilitation Services Pager [phone] Office [phone]   Duncan Dull 01/21/2018, 2:31 PM

## 2018-01-21 NOTE — ED Notes (Signed)
Pt becoming more alert and able to follow simple commands.

## 2018-01-21 NOTE — ED Provider Notes (Signed)
[facility] EMERGENCY DEPARTMENT Provider Note   CSN: [id number] Arrival date & time: 01/21/18  5916   An emergency department physician performed an initial assessment on this suspected stroke patient at [facility].  History   Chief Complaint Chief Complaint  Patient presents with  . Code Stroke  . Altered Mental Status    HPI Jamie [name] is a 67 y.o. female.  Patient was brought to the emergency department as a code stroke by EMS.  Patient comes from home.  She lives alone in a trailer, her son lives in the same trailer park.  He had not seen her since 5:30 PM yesterday.  He checked on her at 2 AM and she was acutely very confused.  EMS thought that she might have some left-sided deficit upon their initial evaluation, called a code stroke.  At arrival to the ER, patient is markedly confused but no focal findings are apparent.  She cannot answer questions appropriately to provide more information.Level V Caveat due to mental status change      Past Medical History:  Diagnosis Date  . Brain cancer ([place])   . Cancer ([place])   . CKD (chronic kidney disease), stage III ([place])   . Dementia ([place])   . High cholesterol   . Insomnia   . Lung cancer ([place])   . Osteoporosis     Patient Active Problem List   Diagnosis Date Noted  . CKD (chronic kidney disease), stage III ([place]) 10/31/2017  . Altered mental status 10/30/2017  . Herniated intervertebral disc of lumbar spine 10/24/2017  . Memory loss 02/16/2017  . History of therapeutic radiation 08/03/2016  . Osteoporosis 10/09/2014  . At high risk for falls 10/09/2014  . Hx of cancer of lung 08/01/2014  . Hx of brain cancer 08/01/2014  . Personal history of colonic polyps - adenomas 07/18/2013  . Syncope 03/22/2011  . Hypotension 03/22/2011  . Memory impairment 03/22/2011  . History of cancer 03/22/2011  . Hyperlipidemia 03/22/2011    Past Surgical History:  Procedure Laterality Date  . ABDOMINAL HYSTERECTOMY  1986  . BRAIN  SURGERY  2001   TUMOR REMOVAL  . catarac Bilateral   . EYE SURGERY    . LOBECTOMY       OB History    Gravida  2   Para  2   Term  2   Preterm      AB      Living        SAB      TAB      Ectopic      Multiple      Live Births               Home Medications    Prior to Admission medications   Medication Sig Start Date End Date Taking? Authorizing Provider  aspirin EC 81 MG tablet Take 81 mg by mouth daily.      [provider]  diazepam (VALIUM) 10 MG tablet TAKE 1 TABLET AT BEDTIME AS NEEDED FOR ANXIETY 11/21/17   Terald Sleeper, PA-C  donepezil (ARICEPT) 5 MG tablet Take 1 tablet (5 mg total) by mouth at bedtime. 11/01/17 11/01/18  Manuella Ghazi, Pratik D, DO  folic acid (FOLVITE) 1 MG tablet Take 1 tablet (1 mg total) by mouth daily. 02/16/17   Terald Sleeper, PA-C  ibuprofen (ADVIL,MOTRIN) 800 MG tablet Take 1 tablet (800 mg total) by mouth every 8 (eight) hours as needed. 02/16/17   Ronnald Ramp,  Londell Moh, PA-C  MAGNESIUM PO Take by mouth.    [provider]  Multiple Vitamins-Minerals (ALIVE WOMENS ENERGY PO) Take 1 tablet by mouth daily.    [provider]  rosuvastatin (CRESTOR) 10 MG tablet TAKE ONE TABLET AT BEDTIME 01/13/18   Terald Sleeper, PA-C  valACYclovir (VALTREX) 1000 MG tablet Take 2 tabs at onset, then 2 tabs 1 hour later 08/03/16   Terald Sleeper, PA-C    Family History Family History  Problem Relation Age of Onset  . Cancer Mother        cirrhois of liver  . Heart attack Father   . Colon cancer Father   . Asthma Brother     Social History Social History   Tobacco Use  . Smoking status: Former Smoker    Years: 33.00    Types: Cigarettes    Last attempt to quit: 04/20/1999    Years since quitting: 18.7  . Smokeless tobacco: Never Used  Substance Use Topics  . Alcohol use: Yes    Alcohol/week: 3.0 standard drinks    Types: 3 Cans of beer per week    Comment: occassional Saturday night 3 dark beer.  . Drug use: No      Allergies   Fosamax [alendronate sodium] and Sulfonamide derivatives   Review of Systems Review of Systems  Unable to perform ROS: Mental status change     Physical Exam Updated Vital Signs BP 136/83   Pulse 92   Temp 98.5 F (36.9 C) (Oral)   Resp 17   Ht 5\' 7"  (1.702 m)   Wt 76 kg   SpO2 97%   BMI 26.24 kg/m   Physical Exam  Constitutional: She appears well-developed and well-nourished. No distress.  HENT:  Head: Normocephalic and atraumatic.  Right Ear: Hearing normal.  Left Ear: Hearing normal.  Nose: Nose normal.  Mouth/Throat: Oropharynx is clear and moist and mucous membranes are normal.  Eyes: Pupils are equal, round, and reactive to light. Conjunctivae and EOM are normal.  Pupils 2 to 3 mm, reactive bilaterally  Neck: Normal range of motion. Neck supple.  Cardiovascular: Regular rhythm, S1 normal and S2 normal. Exam reveals no gallop and no friction rub.  No murmur heard. Pulmonary/Chest: Effort normal and breath sounds normal. No respiratory distress. She exhibits no tenderness.  Abdominal: Soft. Normal appearance and bowel sounds are normal. There is no hepatosplenomegaly. There is no tenderness. There is no rebound, no guarding, no tenderness at McBurney's point and negative Murphy's sign. No hernia.  Musculoskeletal: Normal range of motion.  Neurological: She has normal strength. No cranial nerve deficit or sensory deficit. Coordination normal. GCS eye subscore is 4. GCS verbal subscore is 4. GCS motor subscore is 6.  Skin: Skin is warm, dry and intact. No rash noted. No cyanosis.  Psychiatric: Her speech is slurred.  Nursing note and vitals reviewed.    ED Treatments / Results  Labs (all labs ordered are listed, but only abnormal results are displayed) Labs Reviewed  CBC - Abnormal; Notable for the following components:      Result Value   WBC 13.4 (*)    All other components within normal limits  DIFFERENTIAL - Abnormal; Notable for the  following components:   Neutro Abs 11.0 (*)    All other components within normal limits  COMPREHENSIVE METABOLIC PANEL - Abnormal; Notable for the following components:   Glucose, Bld 119 (*)    Creatinine, Ser 1.15 (*)    GFR  calc non Af Amer 49 (*)    GFR calc Af Amer 57 (*)    All other components within normal limits  RAPID URINE DRUG SCREEN, HOSP PERFORMED - Abnormal; Notable for the following components:   Benzodiazepines POSITIVE (*)    All other components within normal limits  URINALYSIS, ROUTINE W REFLEX MICROSCOPIC - Abnormal; Notable for the following components:   APPearance CLOUDY (*)    All other components within normal limits  I-STAT CHEM 8, ED - Abnormal; Notable for the following components:   Creatinine, Ser 1.30 (*)    Glucose, Bld 114 (*)    All other components within normal limits  ETHANOL  PROTIME-INR  APTT  I-STAT TROPONIN, ED    EKG EKG Interpretation  Date/Time:  Saturday January 21 2018 02:30:31 EDT Ventricular Rate:  87 PR Interval:    QRS Duration: 86 QT Interval:  366 QTC Calculation: 441 R Axis:   38 Text Interpretation:  Sinus rhythm Low voltage, extremity and precordial leads Baseline wander in lead(s) II III aVR aVL aVF Confirmed by Orpah Greek [phone]) on 01/21/2018 3:59:48 AM   Radiology Ct Head Wo Contrast  Result Date: 01/21/2018 CLINICAL DATA:  Acute mental status change. EXAM: CT HEAD WITHOUT CONTRAST TECHNIQUE: Contiguous axial images were obtained from the base of the skull through the vertex without intravenous contrast. COMPARISON:  October 30, 2017 CT scan FINDINGS: Brain: No subdural, epidural, or subarachnoid hemorrhage. Encephalomalacia at the left frontal vertex is stable. Cerebellum, brainstem, and basal cisterns are normal. Ventricles and sulci are unremarkable. White matter changes are stable. No acute cortical ischemia infarct. No mass effect or midline shift. Vascular: No hyperdense vessel or unexpected  calcification. Skull: Previous left craniotomy at the vertex frontally. Sinuses/Orbits: Opacification of left mastoid air cells posteriorly and inferiorly are stable. Paranasal sinuses, mastoid air cells, and middle ears are otherwise normal. Other: None. IMPRESSION: No acute intracranial abnormalities identified. Chronic white matter changes and left frontal vertex encephalomalacia. Electronically Signed   By: Dorise Bullion III M.D   On: 01/21/2018 02:35   Dg Chest Port 1 View  Result Date: 01/21/2018 CLINICAL DATA:  Mental status change. EXAM: PORTABLE CHEST 1 VIEW COMPARISON:  10/30/2017 FINDINGS: The cardiomediastinal contours are unchanged with aortic atherosclerosis and tortuosity. Minor basilar atelectasis. Pulmonary vasculature is normal. No consolidation, pleural effusion, or pneumothorax. No acute osseous abnormalities are seen. IMPRESSION: Mild bibasilar atelectasis. Electronically Signed   By: Keith Rake M.D.   On: 01/21/2018 04:45    Procedures Procedures (including critical care time)  Medications Ordered in ED Medications - No data to display   Initial Impression / Assessment and Plan / ED Course  I have reviewed the triage vital signs and the nursing notes.  Pertinent labs & imaging results that were available during my care of the patient were reviewed by me and considered in my medical decision making (see chart for details).     Patient presents to the emergency department for evaluation of altered mental status.  She was initially called a code stroke by EMS.  She had not been seen by her son for 8 hours.  He does have a history of brain tumor and craniotomy.  Based on timing and past medical history, not a candidate for TPA.  At arrival her exam is nonfocal.  She is extremely confused and disoriented, but can follow some commands with all 4 extremities equally.  Code stroke was canceled.  Work-up has been unrevealing.  Son had  told EMS that she takes a medication  at night to help her sleep.  Her records indicate that she has a prescription for Valium for this.  Suspect that her mental status changes are likely related to her medications, but she does have a significant past medical history.  She has slightly improved over the course of time here in the ER, will observe in the hospital for further treatment.  Final Clinical Impressions(s) / ED Diagnoses   Final diagnoses:  Acute encephalopathy    ED Discharge Orders    None       Faizan Geraci, Gwenyth Allegra, MD 01/21/18 0501

## 2018-01-21 NOTE — ED Notes (Signed)
[facility] will send transport as soon as a truck is available after 7am

## 2018-01-21 NOTE — Evaluation (Signed)
Speech Language Pathology Evaluation Patient Details Name: Jamie [name] MRN: [id number] DOB: 02-Jul-1950 Today's Date: 01/21/2018 Time: 1749-4496 SLP Time Calculation (min) (ACUTE ONLY): 8 min  Problem List:  Patient Active Problem List   Diagnosis Date Noted  . Acute encephalopathy 01/21/2018  . CKD (chronic kidney disease), stage III ([place]) 10/31/2017  . Altered mental status 10/30/2017  . Herniated intervertebral disc of lumbar spine 10/24/2017  . Memory loss 02/16/2017  . History of therapeutic radiation 08/03/2016  . Osteoporosis 10/09/2014  . At high risk for falls 10/09/2014  . Hx of cancer of lung 08/01/2014  . Hx of brain cancer 08/01/2014  . Personal history of colonic polyps - adenomas 07/18/2013  . Memory impairment 03/22/2011  . History of cancer 03/22/2011  . Hyperlipidemia 03/22/2011   Past Medical History:  Past Medical History:  Diagnosis Date  . Brain cancer ([place])   . Cancer ([place])   . CKD (chronic kidney disease), stage III ([place])   . Dementia ([place])   . High cholesterol   . Insomnia   . Lung cancer ([place])   . Osteoporosis    Past Surgical History:  Past Surgical History:  Procedure Laterality Date  . ABDOMINAL HYSTERECTOMY  1986  . BRAIN SURGERY  2001   TUMOR REMOVAL  . catarac Bilateral   . EYE SURGERY    . LOBECTOMY     HPI:  Pt is a 67 y.o. female with medical history significant for lung cancer with brain metastasis status post remote craniotomy and resection, memory loss, chronic kidney disease stage III, and insomnia, who presented to the emergency department for evaluation of somnolence, confusion, and possible left-sided weakness. CT Head negative; MRI pending. Swallowing appeared functional during BSE in July 2019, with regular diet and thin liquids recommended at that time despite AMS.   Assessment / Plan / Recommendation Clinical Impression  Pt is slow to respond with limited spontaneous verbal output. She has reduced sustained attention,  but when able to focus, she follows one-step commands and answers simple yes/no questions well. She does not consistently respond to more abstract questioning, seemingly losing her train of thought as she tries to come up with an answer. She has basic orientation to person/place but does not know her birthday. She used environmental cues with Mod I to determine which hospital she was at. She is disoriented to time and situation. Although pt has a h/o cognitive impairment, she also says that she lives at home alone, making it appear that this is an acute change in function. Therefore, SLP to f/u to maximize cognitive-linguistic function and safety. At this point, would recommend 24/7 supervision and SLP f/u upon d/c.    SLP Assessment  SLP Recommendation/Assessment: Patient needs continued Speech Lanaguage Pathology Services SLP Visit Diagnosis: Cognitive communication deficit (R41.841)    Follow Up Recommendations  24 hour supervision/assistance(SLP f/u @ next level of care)    Frequency and Duration min 2x/week  2 weeks      SLP Evaluation Cognition  Overall Cognitive Status: No family/caregiver present to determine baseline cognitive functioning Arousal/Alertness: Awake/alert Orientation Level: Oriented to person;Disoriented to time;Disoriented to situation;Oriented to place Attention: Sustained Sustained Attention: Impaired Sustained Attention Impairment: Verbal basic Memory: (impaired at baseline per hx) Comments: slow to respond       Comprehension  Auditory Comprehension Overall Auditory Comprehension: Impaired Yes/No Questions: Impaired Basic Biographical Questions: 76-100% accurate Basic Immediate Environment Questions: 75-100% accurate Complex Questions: 25-49% accurate Commands: Impaired One Step Basic Commands: 50-74% accurate  Interfering Components: Attention    Expression Expression Primary Mode of Expression: Verbal Verbal Expression Overall Verbal Expression:  Impaired(reduced verbal output)   Oral / Motor  Oral Motor/Sensory Function Overall Oral Motor/Sensory Function: Within functional limits Motor Speech Overall Motor Speech: Appears within functional limits for tasks assessed   GO                    [name] 01/21/2018, 11:48 AM   [name], M.A. [place] Acute Environmental education officer [phone] Office [phone]

## 2018-01-21 NOTE — Progress Notes (Signed)
Patient seen and examined. Admitted after midnight secondary to increase somnolence, left side weakness and confusion. Patient with hx of brain tumor s/p resection, memory loss, insomnia and CKD stage 3. Last seen normal around 4pm on 01/20/18. Hemodynamically stable, but still confuse, slow to response and having some decrease MS (L > R); unknown baseline. No fever, no CP, no SOB. Please refer to H&P written by Dr. Myna Hidalgo for further info/details.  Plan: -place in observation at [facility] for TIA/stroke work up. -follow results of MRI, RPR, B12 and TSH -Ammonia level 14 -no signs of infection on her UA -UDS positive for benzodiazepine  -troponin neg and rest of basic blood work unremarkable -CT scan of the head negative for acute intracranial abnormalities.   Barton Dubois MD [phone]

## 2018-01-22 DIAGNOSIS — S065X9A Traumatic subdural hemorrhage with loss of consciousness of unspecified duration, initial encounter: Secondary | ICD-10-CM | POA: Diagnosis not present

## 2018-01-22 DIAGNOSIS — S065XAA Traumatic subdural hemorrhage with loss of consciousness status unknown, initial encounter: Secondary | ICD-10-CM

## 2018-01-22 DIAGNOSIS — G9340 Encephalopathy, unspecified: Secondary | ICD-10-CM | POA: Diagnosis not present

## 2018-01-22 DIAGNOSIS — N183 Chronic kidney disease, stage 3 (moderate): Secondary | ICD-10-CM | POA: Diagnosis not present

## 2018-01-22 HISTORY — DX: Traumatic subdural hemorrhage with loss of consciousness status unknown, initial encounter: S06.5XAA

## 2018-01-22 LAB — RPR: RPR Ser Ql: NONREACTIVE

## 2018-01-22 LAB — HIV ANTIBODY (ROUTINE TESTING W REFLEX): HIV Screen 4th Generation wRfx: NONREACTIVE

## 2018-01-22 NOTE — Progress Notes (Addendum)
PROGRESS NOTE  Jamie [name] WER:[id number] DOB: Feb 01, 1951 DOA: 01/21/2018 PCP: Terald Sleeper, PA-C  Brief Narrative: 67 year old woman lives alone, PMH lung cancer with brain metastasis, status post craniotomy, son found her acutely confused early this a.m., EMS thought there might of been a left-sided deficit and called a code stroke, on arrival to the ED, patient was confused but there were no focal findings documented.  She was last seen normal greater than 8 hours and was therefore not a candidate for TPA.  Son reported patient had started a new sleeping medication (in addition to Valium) and it was thought that this would account for her mental status changes.  Had a similar presentation in July.  Admitted for acute encephalopathy.  Assessment/Plan Subdural left cerebral convexity with increased signal indicating interval hemorrhage, discussed with Dr. Nevada Crane radiology.  In retrospect, small subdural hematoma seen on admission CT 10/5 as well as previous MRI 7/15.  On previous study this appeared to be more isodense, MRI brain from this admission appearance consistent with hemorrhage, interval increase in size. --Appears asymptomatic, no focal neurologic deficits noted, speech has cleared and is fluent and appropriate. --Etiology of fluid collection is unclear, no recent falls reported.  Takes aspirin 2 days/week.  Not on any anticoagulation. --Discussed with Dr. Trenton Gammon neurosurgery, he concurs that this was present on previous studies, it would not be expected that this would cause any complications or grow in size.  He recommends conservative management, no further imaging.  Acute encephalopathy, no focal deficits noted other than some dysarthria.  Head CT no acute findings. Hospitalized July 2019, underwent MRI brain, EEG which were grossly unremarkable and patient gradually returned to baseline without any specific etiology identified. --Appears to be resolving.  Initially thought to be drug  related, patient denies taking any medications not prescribed.  Cannot rule out arrhythmia or syncope however EKG it showed sinus rhythm and telemetry has been unremarkable.  Last echocardiogram was 2012, will repeat to assess structure and valves.  CKD stage III --Stable.  PMH lung cancer with brain metastasis, status post left frontoparietal craniotomy with gross total evacuation of brain tumor July 2001 by Dr. Ellene Route.  Pathology showed metastatic poorly differentiated adenocarcinoma..  Memory loss managed by neurology at [facility]. At baseline walks with a cane, lives alone her son, oriented to person, place and situation. --Appears stable.  Continue Aricept.  Will follow clinically today, if no further issues, anticipate discharge 10/7.  PT has recommended skilled nursing facility, social work consulted.  DVT prophylaxis: enoxaparin Code Status: Full Family Communication: none Disposition Plan: SNF?   Murray Hodgkins, MD  [facility] Direct contact: [phone] --Via amion app OR  --[email or web address]; password [email or web address]  7PM-7AM contact night coverage as above 01/22/2018, 9:17 AM  LOS: 0 days   Consultants:    Procedures:    Antimicrobials:    Interval history/Subjective: MRI brain noted Patient feels well, no neurologic complaints. "Wondering how I got here". Denies taking any meds not prescribed. No recent falls.   Objective: Vitals:  Vitals:   01/22/18 0319 01/22/18 0810  BP: 112/66 107/62  Pulse: 73 70  Resp: 20 20  Temp: 97.8 F (36.6 C) 99.5 F (37.5 C)  SpO2: 100% 100%    Exam: Constitutional:   . Appears calm and comfortable Eyes:  . pupils and irises appear normal . Normal lids and conjunctivae ENMT:  . grossly normal hearing  . Lips appear normal Respiratory:  . CTA bilaterally, no w/r/r.  . Respiratory  effort normal.  Cardiovascular:  . RRR, no m/r/g . No LE extremity edema   Musculoskeletal:  . RUE, LUE, RLE, LLE   o strength  and tone normal, no atrophy, no abnormal movements Neurologic:  . CN 2-12 intact . Sensation all 4 extremities intact . No pronator drift, no upper extremity dysdiadochokinesis Psychiatric:  . Mental status o Mood, affect appropriate o Oriented to person, hospital, month, year  I have personally reviewed the following:   Data: . I/O o UOP: 450 o BM: Unrecorded  CBG:   Labs: HIV nonreactive  Imaging: MRI brain noted    Scheduled Meds: . donepezil  5 mg Oral QHS  . folic acid  1 mg Oral Daily  . rosuvastatin  10 mg Oral q1800  . sodium chloride flush  3 mL Intravenous Q12H   Continuous Infusions:   Principal Problem:   Subdural hematoma (HCC) Active Problems:   Hx of brain cancer   Memory loss   CKD (chronic kidney disease), stage III (HCC)   Acute encephalopathy   LOS: 0 days     Time spent 35 minutes, in counseling, coordination of care greater than 50%, discussion with neurosurgery, radiology

## 2018-01-22 NOTE — Evaluation (Signed)
Occupational Therapy Evaluation Patient Details Name: Jamie [name] MRN: [id number] DOB: [birth year] Today's Date: 01/22/2018    History of Present Illness Pt is a 67 y.o. female with medical history significant for lung cancer with brain metastasis status post remote craniotomy and resection, memory loss, chronic kidney disease stage III, and insomnia, who presented to the emergency department for evaluation of somnolence, confusion, and possible left-sided weakness. CT Head negative; MRI revealing subdural left cerebral convexity with increased signal indicating interval hemorrhage (increase in size).    Clinical Impression   PTA patient reports living alone, independent with ADLs and mobility, limited IADLs (independent med mgmt, microwave meals, but had assist from friend with finances).  Pt currently admitted for above and limited by cognition (problem solving, awareness, attention, sequencing), safety, generalized weakness, impaired balance, and decreased activity tolerance. She currently requires min assist for bed mobility, mod assist for toilet transfers, min assist for toileting, min assist for UB ADL and mod to max assist for LB ADL.  Based on performance today, patient will benefit from continued OT services while admitted and after dc at SNF level in order to optimize independence with ADLs and mobility.  Will continue to follow.     Follow Up Recommendations  SNF;Supervision/Assistance - 24 hour(if declines SNF needs 24/7 care and HHOT)    Equipment Recommendations  3 in 1 bedside commode    Recommendations for Other Services       Precautions / Restrictions Precautions Precautions: Fall Restrictions Weight Bearing Restrictions: No      Mobility Bed Mobility Overal bed mobility: Needs Assistance Bed Mobility: Supine to Sit     Supine to sit: Min assist     General bed mobility comments: min assist for trunk support to EOB  Transfers Overall transfer level: Needs  assistance Equipment used: Rolling walker (2 wheeled) Transfers: Sit to/from Stand Sit to Stand: Min assist;Mod assist         General transfer comment: min assist from EOB, mod assist from commode; cueing for hand placement and safety, increased time and effort    Balance Overall balance assessment: History of Falls;Needs assistance Sitting-balance support: No upper extremity supported;Feet supported Sitting balance-Leahy Scale: Fair     Standing balance support: During functional activity;No upper extremity supported Standing balance-Leahy Scale: Poor Standing balance comment: requires min guard for static standing with 0 hand support, preference to B UE support                           ADL either performed or assessed with clinical judgement   ADL Overall ADL's : Needs assistance/impaired     Grooming: Min guard;Standing   Upper Body Bathing: Minimal assistance;Sitting   Lower Body Bathing: Sit to/from stand;Moderate assistance   Upper Body Dressing : Minimal assistance;Sitting   Lower Body Dressing: Maximal assistance;Sit to/from stand   Toilet Transfer: Moderate assistance;Cueing for safety;Cueing for sequencing;Ambulation;Regular Toilet;Grab bars;RW Armed forces technical officer Details (indicate cue type and reason): mod assist to ascend from commode, max cueing for seqeucing and hand placement Toileting- Clothing Manipulation and Hygiene: Minimal assistance;Sit to/from stand       Functional mobility during ADLs: Minimal assistance;Rolling walker(in room mobility with assist for balance and walker mgmt) General ADL Comments: Pt requires increased time for all activiites, cueing for sequencing and attention to task.      Vision Baseline Vision/History: Wears glasses Wears Glasses: Reading only Patient Visual Report: No change from baseline(reporting needing new glasses) Additional  Comments: further assessment recommended, WFL in functional ADL context       Perception     Praxis      Pertinent Vitals/Pain Pain Assessment: Faces Faces Pain Scale: No hurt     Hand Dominance Right   Extremity/Trunk Assessment Upper Extremity Assessment Upper Extremity Assessment: Generalized weakness;RUE deficits/detail RUE Deficits / Details: shoulder flexion to 90 degrees only, reports hx of humerus fx  RUE Sensation: WNL RUE Coordination: decreased gross motor;decreased fine motor   Lower Extremity Assessment Lower Extremity Assessment: Defer to PT evaluation       Communication Communication Communication: HOH   Cognition Arousal/Alertness: Awake/alert Behavior During Therapy: WFL for tasks assessed/performed Overall Cognitive Status: No family/caregiver present to determine baseline cognitive functioning Area of Impairment: Attention;Memory;Following commands;Safety/judgement;Awareness;Problem solving                   Current Attention Level: Sustained Memory: Decreased short-term memory;Decreased recall of precautions Following Commands: Follows one step commands consistently;Follows one step commands with increased time Safety/Judgement: Decreased awareness of safety;Decreased awareness of deficits Awareness: Emergent Problem Solving: Slow processing;Decreased initiation;Difficulty sequencing;Requires verbal cues     General Comments  pt oxgyen removed during toileting/mobilty, stable on RA at 97% saturations    Exercises     Shoulder Instructions      Home Living Family/patient expects to be discharged to:: Private residence Living Arrangements: Alone(son, checks on her) Available Help at Discharge: Family;Available PRN/intermittently(reports lives alone and son checks on her) Type of Home: Mobile home Home Access: Stairs to enter Technical brewer of Steps: 4-5 Entrance Stairs-Rails: Right Home Layout: One level           Bathroom Accessibility: Yes   Home Equipment: Walker - 2 wheels;Shower seat;Cane -  single point          Prior Functioning/Environment Level of Independence: Needs assistance  Gait / Transfers Assistance Needed: household ambulator without AD per patient ADL's / Homemaking Assistance Needed: reports independent with ADLs and limited IADLs (microwave meals), completed med mgmt indepenently and friend helps with fiancial mgmt             OT Problem List: Decreased strength;Decreased range of motion;Decreased activity tolerance;Impaired balance (sitting and/or standing);Decreased coordination;Decreased cognition;Decreased safety awareness;Decreased knowledge of use of DME or AE;Decreased knowledge of precautions;Impaired UE functional use      OT Treatment/Interventions: Self-care/ADL training;Therapeutic exercise;Energy conservation;DME and/or AE instruction;Manual therapy;Therapeutic activities;Cognitive remediation/compensation;Patient/family education;Balance training    OT Goals(Current goals can be found in the care plan section) Acute Rehab OT Goals Patient Stated Goal: to get home OT Goal Formulation: With patient Time For Goal Achievement: 02/05/18 Potential to Achieve Goals: Good  OT Frequency: Min 2X/week   Barriers to D/C:            Co-evaluation              AM-PAC PT "6 Clicks" Daily Activity     Outcome Measure Help from another person eating meals?: None Help from another person taking care of personal grooming?: A Little Help from another person toileting, which includes using toliet, bedpan, or urinal?: A Little Help from another person bathing (including washing, rinsing, drying)?: A Lot Help from another person to put on and taking off regular upper body clothing?: A Little Help from another person to put on and taking off regular lower body clothing?: A Lot 6 Click Score: 17   End of Session Equipment Utilized During Treatment: Gait belt;Rolling walker;Oxygen Nurse Communication: Mobility status  Activity  Tolerance: Patient  tolerated treatment well Patient left: in chair;with call bell/phone within reach;with chair alarm set  OT Visit Diagnosis: Unsteadiness on feet (R26.81);Other abnormalities of gait and mobility (R26.89);Muscle weakness (generalized) (M62.81);History of falling (Z91.81);Other symptoms and signs involving cognitive function                Time: 4076-8088 OT Time Calculation (min): 30 min Charges:  OT General Charges $OT Visit: 1 Visit OT Evaluation $OT Eval Moderate Complexity: 1 Mod OT Treatments $Self Care/Home Management : 8-22 mins  Delight Stare, OT Acute Rehabilitation Services Pager [phone] Office [phone]   Delight Stare 01/22/2018, 10:48 AM

## 2018-01-23 ENCOUNTER — Observation Stay (HOSPITAL_BASED_OUTPATIENT_CLINIC_OR_DEPARTMENT_OTHER): Payer: Medicare Other

## 2018-01-23 DIAGNOSIS — G9340 Encephalopathy, unspecified: Secondary | ICD-10-CM | POA: Diagnosis not present

## 2018-01-23 DIAGNOSIS — I5030 Unspecified diastolic (congestive) heart failure: Secondary | ICD-10-CM

## 2018-01-23 DIAGNOSIS — S065X9A Traumatic subdural hemorrhage with loss of consciousness of unspecified duration, initial encounter: Secondary | ICD-10-CM

## 2018-01-23 DIAGNOSIS — R413 Other amnesia: Secondary | ICD-10-CM | POA: Diagnosis not present

## 2018-01-23 LAB — ECHOCARDIOGRAM COMPLETE
Height: 67 in
Weight: 2657.6 oz

## 2018-01-23 LAB — FOLATE RBC
Folate, Hemolysate: 579.4 ng/mL
Folate, RBC: 1452 ng/mL (ref >498)
Hematocrit: 39.9 % (ref 34.0–46.6)

## 2018-01-23 NOTE — Progress Notes (Signed)
  Echocardiogram 2D Echocardiogram has been performed.  Merrie Roof F 01/23/2018, 12:00 PM

## 2018-01-23 NOTE — Care Management Note (Signed)
Case Management Note  Patient Details  Name: Jamie [name] MRN: [id number] Date of Birth: 1951/01/21  Subjective/Objective:    Pt admitted with subdural hematoma. She states her youngest son and his girlfriend and children are living in her home currently. Pt has a cane at home. She states she doesn't drive and her friend provides transportation for her. She denies any issues obtaining her medications.                Action/Plan: Recommendations are for SNF. CM met with her and she is agreeable. She would like a facility in [place]. FL2 completed and pt faxed out. CM attempted to call both her sons without success and unable to leave a message.  CSW updated and CM following.  Expected Discharge Date:  01/24/18               Expected Discharge Plan:  Skilled Nursing Facility  In-House Referral:  Clinical Social Work  Discharge planning Services  CM Consult  Post Acute Care Choice:    Choice offered to:     DME Arranged:    DME Agency:     HH Arranged:    [place] Agency:     Status of Service:  In process, will continue to follow  If discussed at Long Length of Stay Meetings, dates discussed:    Additional Comments:  Jamie Friar, RN 01/23/2018, 5:17 PM

## 2018-01-23 NOTE — Progress Notes (Signed)
PROGRESS NOTE  RAKSHA WOLFGANG ZOX:[id number] DOB: 1950-06-22 DOA: 01/21/2018 PCP: Terald Sleeper, PA-C  Brief Narrative: 67 year old woman lives alone, PMH lung cancer with brain metastasis, status post craniotomy, son found her acutely confused early this a.m., EMS thought there might of been a left-sided deficit and called a code stroke, on arrival to the ED, patient was confused but there were no focal findings documented.  She was last seen normal greater than 8 hours and was therefore not a candidate for TPA.  Son reported patient had started a new sleeping medication (in addition to Valium) and it was thought that this would account for her mental status changes.  Had a similar presentation in July.  Admitted for acute encephalopathy.  Assessment/Plan Subdural left cerebral convexity with increased signal indicating interval hemorrhage, discussed with Dr. Nevada Crane radiology.  In retrospect, small subdural hematoma seen on admission CT 10/5 as well as previous MRI 7/15.  On previous study this appeared to be more isodense, MRI brain from this admission appearance consistent with hemorrhage, interval increase in size. Discussed with Dr. Trenton Gammon neurosurgery, he concurs that this was present on previous studies, it would not be expected that this would cause any complications or grow in size.  He recommends conservative management, no further imaging. --Remains asymptomatic.  Per friend at bedside patient has returned mentally to baseline.  At baseline has some confusion and memory loss. --Presume fluid collection is traumatic in nature.  Not on any anticoagulation.  Was taking aspirin twice a week.  This will be discontinued.  Acute encephalopathy, no focal deficits noted other than some dysarthria.  Head CT no acute findings. Hospitalized July 2019, underwent MRI brain, EEG which were grossly unremarkable and patient gradually returned to baseline without any specific etiology identified. --Resolved.   Wonder if drug related.  Patient on Valium.  Would recommend weaning off this under medical supervision as an outpatient.  Unclear whether syncope may have played a role but there is no indication of arrhythmia.  Telemetry unremarkable.  EKG nonacute.  Repeat echocardiogram is pending as last was done in 2012. --We will check carotid ultrasound as well to complete work-up.  If this and echocardiogram are unremarkable, no further work-up would be planned.  CKD stage III --Stable.  PMH lung cancer with brain metastasis, status post left frontoparietal craniotomy with gross total evacuation of brain tumor July 2001 by Dr. Ellene Route.  Pathology showed metastatic poorly differentiated adenocarcinoma..  Memory loss managed by neurology at [facility]. At baseline walks with a cane, lives alone her son, oriented to person, place and situation. --At baseline.  Continue Aricept.  Appears to be at baseline.  Complete echocardiogram and carotid ultrasound.  Follow-up PT recommendations for SNF and social work consultation.  Anticipate medical clearance for discharge later today.  DVT prophylaxis: enoxaparin Code Status: Full Family Communication: Discussed in detail with friend/caretaker [name] who helps the patient, pays bills who provides other assistance.  She reports the patient's mental status is at baseline.  I attempted to contact the patient's sons yesterday, the phone rang with no answer.  I called [name] today and the voice mailbox is full. Disposition Plan: SNF?   Murray Hodgkins, MD  [facility] Direct contact: [phone] --Via amion app OR  --[email or web address]; password [email or web address]  7PM-7AM contact night coverage as above 01/23/2018, 10:06 AM  LOS: 0 days   Consultants:    Procedures:    Antimicrobials:    Interval history/Subjective: Feels well, no complaints  Objective:  Vitals:  Vitals:   01/23/18 0503 01/23/18 0825  BP: 135/90 125/71  Pulse: 76 89  Resp: 18 18    Temp: 97.9 F (36.6 C) 98.5 F (36.9 C)  SpO2: 94% 97%    Exam: Constitutional:   . Appears calm and comfortable Eyes:  . pupils and irises appear normal . Normal lids ENMT:  . grossly normal hearing  Respiratory:  . CTA bilaterally, no w/r/r.  . Respiratory effort normal.  Cardiovascular:  . RRR, no m/r/g . No LE extremity edema   . Telemetry SR Musculoskeletal:  . RUE, LUE, RLE, LLE   o strength and tone normal, no atrophy, no abnormal movements Neurologic:  . CN 2-12 intact . Sensation all 4 extremities intact . No pronator drift . No UE dysdiadokinesis Psychiatric:  . Mental status o Mood, affect appropriate  I have personally reviewed the following:   Data:  CBG:   Labs: Ammonia within normal limits   Scheduled Meds: . donepezil  5 mg Oral QHS  . folic acid  1 mg Oral Daily  . rosuvastatin  10 mg Oral q1800  . sodium chloride flush  3 mL Intravenous Q12H   Continuous Infusions:   Principal Problem:   Subdural hematoma (HCC) Active Problems:   Hx of brain cancer   Memory loss   CKD (chronic kidney disease), stage III (HCC)   Acute encephalopathy   LOS: 0 days     Time spent 35 minutes, in discussion with patient, friend at bedside, attempting to contact son.

## 2018-01-23 NOTE — Care Management Obs Status (Signed)
[place] NOTIFICATION   Patient Details  Name: CLARISSA LAIRD MRN: [id number] Date of Birth: Aug 28, 1950   Medicare Observation Status Notification Given:  Yes    Pollie Friar, RN 01/23/2018, 3:11 PM

## 2018-01-23 NOTE — NC FL2 (Signed)
[place] LEVEL OF CARE SCREENING TOOL     IDENTIFICATION  Patient Name: Jamie [name] Birthdate: 26-Apr-1950 Sex: female Admission Date (Current Location): 01/21/2018  [facility] and [place] Number:  [employer] and Address:  [facility] [place], [place] [address] [place], [place] [address]      Provider Number: [id number]  Attending Physician Name and Address:  Samuella Cota, MD  Relative Name and Phone Number:       Current Level of Care: Hospital Recommended Level of Care: [place] Prior Approval Number:    Date Approved/Denied:   PASRR Number: [id number]  Discharge Plan: SNF    Current Diagnoses: Patient Active Problem List   Diagnosis Date Noted  . Subdural hematoma ([place]) 01/22/2018  . Acute encephalopathy 01/21/2018  . CKD (chronic kidney disease), stage III ([place]) 10/31/2017  . Altered mental status 10/30/2017  . Herniated intervertebral disc of lumbar spine 10/24/2017  . Memory loss 02/16/2017  . History of therapeutic radiation 08/03/2016  . Osteoporosis 10/09/2014  . At high risk for falls 10/09/2014  . Hx of cancer of lung 08/01/2014  . Hx of brain cancer 08/01/2014  . Personal history of colonic polyps - adenomas 07/18/2013  . Memory impairment 03/22/2011  . History of cancer 03/22/2011  . Hyperlipidemia 03/22/2011    Orientation RESPIRATION BLADDER Height & Weight     Self, Place  Normal Continent Weight: 75.3 kg Height:  5\' 7"  (170.2 cm)  BEHAVIORAL SYMPTOMS/MOOD NEUROLOGICAL BOWEL NUTRITION STATUS      Continent Diet(Regular with thin liquids)  AMBULATORY STATUS COMMUNICATION OF NEEDS Skin   Limited Assist Verbally Normal                       Personal Care Assistance Level of Assistance  Bathing, Feeding, Dressing Bathing Assistance: Limited assistance Feeding assistance: Independent Dressing Assistance: Limited assistance     Functional Limitations Info  Sight, Hearing,  Speech Sight Info: Adequate Hearing Info: Impaired Speech Info: Adequate    SPECIAL CARE FACTORS FREQUENCY  PT (By licensed PT), OT (By licensed OT), Speech therapy     PT Frequency: 5x/wk OT Frequency: 5x/wk     Speech Therapy Frequency: 5x/wk      Contractures Contractures Info: Not present    Additional Factors Info  Code Status, Allergies, Psychotropic Code Status Info: Full Allergies Info: FOSAMAX ALENDRONATE SODIUM, SULFONAMIDE DERIVATIVES  Psychotropic Info: Aricept 5 mg Daily         Current Medications (01/23/2018):  This is the current hospital active medication list Current Facility-Administered Medications  Medication Dose Route Frequency Provider Last Rate Last Dose  . acetaminophen (TYLENOL) tablet 650 mg  650 mg Oral Q6H PRN Opyd, Ilene Qua, MD   650 mg at 01/21/18 2251   Or  . acetaminophen (TYLENOL) suppository 650 mg  650 mg Rectal Q6H PRN Opyd, Ilene Qua, MD      . donepezil (ARICEPT) tablet 5 mg  5 mg Oral QHS Opyd, Ilene Qua, MD   5 mg at 01/22/18 2155  . folic acid (FOLVITE) tablet 1 mg  1 mg Oral Daily Opyd, Ilene Qua, MD   1 mg at 01/23/18 0916  . LORazepam (ATIVAN) injection 1 mg  1 mg Intravenous Q6H PRN Opyd, Ilene Qua, MD      . ondansetron (ZOFRAN) tablet 4 mg  4 mg Oral Q6H PRN Opyd, Ilene Qua, MD       Or  .  ondansetron (ZOFRAN) injection 4 mg  4 mg Intravenous Q6H PRN Opyd, Ilene Qua, MD      . rosuvastatin (CRESTOR) tablet 10 mg  10 mg Oral q1800 Opyd, Ilene Qua, MD   10 mg at 01/22/18 1733  . sodium chloride flush (NS) 0.9 % injection 3 mL  3 mL Intravenous Q12H Opyd, Ilene Qua, MD   3 mL at 01/23/18 6979     Discharge Medications: Please see discharge summary for a list of discharge medications.  Relevant Imaging Results:  Relevant Lab Results:   Additional Information SS#: [id number]  Pollie Friar, RN

## 2018-01-24 DIAGNOSIS — G9340 Encephalopathy, unspecified: Secondary | ICD-10-CM | POA: Diagnosis not present

## 2018-01-24 DIAGNOSIS — R413 Other amnesia: Secondary | ICD-10-CM | POA: Diagnosis not present

## 2018-01-24 DIAGNOSIS — N183 Chronic kidney disease, stage 3 (moderate): Secondary | ICD-10-CM | POA: Diagnosis not present

## 2018-01-24 DIAGNOSIS — S065X9A Traumatic subdural hemorrhage with loss of consciousness of unspecified duration, initial encounter: Secondary | ICD-10-CM | POA: Diagnosis not present

## 2018-01-24 NOTE — Progress Notes (Addendum)
PROGRESS NOTE  Jamie [name]:[id number] DOB: November 26, 1950 DOA: 01/21/2018 PCP: Terald Sleeper, PA-C  Brief Narrative: 67 year old woman lives alone, PMH lung cancer with brain metastasis, status post craniotomy, son found her acutely confused early this a.m., EMS thought there might of been a left-sided deficit and called a code stroke, on arrival to the ED, patient was confused but there were no focal findings documented.  She was last seen normal greater than 8 hours and was therefore not a candidate for TPA.  Son reported patient had started a new sleeping medication (in addition to Valium) and it was thought that this would account for her mental status changes.  Had a similar presentation in July.  Admitted for acute encephalopathy.  This subsequently cleared spontaneously and may be related to medication.  Incidental finding of subdural fluid collection was noted, requires no further evaluation and no intervention.  Awaiting authorization for skilled nursing facility, patient medically stable for discharge.  Assessment/Plan Subdural left cerebral convexity with increased signal indicating interval hemorrhage, discussed with Dr. Nevada Crane radiology.  In retrospect, small subdural hematoma seen on admission CT 10/5 as well as previous MRI 7/15.  On previous study this appeared to be more isodense, MRI brain from this admission appearance consistent with hemorrhage, interval increase in size. Discussed with Dr. Trenton Gammon neurosurgery, he concurs that this was present on previous studies, it would not be expected that this would cause any complications or grow in size.  He recommends conservative management, no further imaging. --Asymptomatic.  Mentally at baseline.   --Presume fluid collection is traumatic in nature.  Not on any anticoagulation.  Was taking aspirin twice a week.  This will be discontinued.  Acute encephalopathy, no focal deficits noted other than some dysarthria.  Head CT no acute findings.  Hospitalized July 2019, underwent MRI brain, EEG which were grossly unremarkable and patient gradually returned to baseline without any specific etiology identified. --Resolved.  Could be drug related.  Patient on Valium at home.  Would recommend weaning off this under medical supervision.   --No evidence to suggest syncope.  Telemetry unremarkable, EKG nonacute, echocardiogram and carotid ultrasound unremarkable.  No further evaluation suggested.    CKD stage III --Stable.  PMH lung cancer with brain metastasis, status post left frontoparietal craniotomy with gross total evacuation of brain tumor July 2001 by Dr. Ellene Route.  Pathology showed metastatic poorly differentiated adenocarcinoma..  Memory loss managed by neurology at [facility]. At baseline walks with a cane, lives alone her son, oriented to person, place and situation. --Appears to be at baseline.  Continue Aricept.  Patient is medically stable for transfer to skilled nursing facility.  DVT prophylaxis: enoxaparin Code Status: Full Family Communication:  Disposition Plan: SNF   Murray Hodgkins, MD  [facility] Direct contact: [phone] --Via amion app OR  --[email or web address]; password [email or web address]  7PM-7AM contact night coverage as above 01/24/2018, 4:30 PM  LOS: 0 days   Consultants:    Procedures:  Echo Study Conclusions  - Left ventricle: The cavity size was normal. Wall thickness was   normal. Systolic function was normal. The estimated ejection   fraction was in the range of 60% to 65%. Wall motion was normal;   there were no regional wall motion abnormalities. Doppler   parameters are consistent with abnormal left ventricular   relaxation (grade 1 diastolic dysfunction). - Aortic valve: There was no stenosis. - Mitral valve: There was no significant regurgitation. - Right ventricle: The cavity size was normal. Systolic function  was normal. - Pulmonary arteries: PA peak pressure: 18 mm Hg (S). - Inferior  vena cava: The vessel was normal in size. The   respirophasic diameter changes were in the normal range (>= 50%),   consistent with normal central venous pressure.  Impressions:  - Normal LV size with EF 60-65%. Normal RV size and systolic   function. No significant valvular abnormalities.  Antimicrobials:    Interval history/Subjective: Continues to feel well, no complaints.  Objective: Vitals:  Vitals:   01/24/18 0736 01/24/18 1111  BP: 131/72 122/77  Pulse: 78 81  Resp:  16  Temp: 98 F (36.7 C) 98.6 F (37 C)  SpO2: 99% 95%    Exam: Constitutional:   . Appears calm and comfortable Respiratory:  . CTA bilaterally, no w/r/r.  . Respiratory effort normal.  Cardiovascular:  . RRR, no m/r/g . No LE extremity edema   . Telemetry SR Musculoskeletal:  . RUE, LUE, RLE, LLE   o strength and tone normal, no atrophy, no abnormal movements Psychiatric:  . Mental status o Mood, affect appropriate   I have personally reviewed the following:   Data:  Echocardiogram and carotid Dopplers noted.   Scheduled Meds: . donepezil  5 mg Oral QHS  . folic acid  1 mg Oral Daily  . rosuvastatin  10 mg Oral q1800  . sodium chloride flush  3 mL Intravenous Q12H   Continuous Infusions:   Principal Problem:   Subdural hematoma (HCC) Active Problems:   Hx of brain cancer   Memory loss   CKD (chronic kidney disease), stage III (HCC)   Acute encephalopathy   LOS: 0 days

## 2018-01-24 NOTE — Progress Notes (Signed)
  Speech Language Pathology Treatment: Dysphagia  Patient Details Name: Jamie [name] MRN: [id number] DOB: 1950/11/20 Today's Date: 01/24/2018 Time: 4210-3128 SLP Time Calculation (min) (ACUTE ONLY): 10 min  Assessment / Plan / Recommendation Clinical Impression  Pt consumed regular textures and thin liquids with no overt s/s of aspiration. Oral prep and clearance was efficient even when taking large boluses. No cueing needed. Pt has no subjective complaints. Recommend to continue with regular textures and thin liquids. No additional SLP f/u indicated for swallowing.   Pt today is also much more communicative than during initial evaluation, and recalls general daily events and POC. Per MD notes, friend/caretaker reports that pt's mentation has returned to baseline. SLP to s/o acutely.   HPI HPI: Pt is a 67 y.o. female with medical history significant for lung cancer with brain metastasis status post remote craniotomy and resection, memory loss, chronic kidney disease stage III, and insomnia, who presented to the emergency department for evaluation of somnolence, confusion, and possible left-sided weakness. CT Head negative; MRI pending. Swallowing appeared functional during BSE in July 2019, with regular diet and thin liquids recommended at that time despite AMS.      SLP Plan  All goals met       Recommendations  Diet recommendations: Regular;Thin liquid Liquids provided via: Cup;Straw Medication Administration: Whole meds with liquid Supervision: Patient able to self feed;Intermittent supervision to cue for compensatory strategies Compensations: Slow rate;Small sips/bites Postural Changes and/or Swallow Maneuvers: Seated upright 90 degrees                Oral Care Recommendations: Oral care BID Follow up Recommendations: 24/7 supervision SLP Visit Diagnosis: Dysphagia, unspecified (R13.10) Plan: All goals met       GO                [name] 01/24/2018, 9:48  AM  [name], M.A. [facility] Acute Environmental education officer [phone] Office [phone]

## 2018-01-24 NOTE — Progress Notes (Signed)
Physical Therapy Treatment Patient Details Name: Jamie [name] MRN: [id number] DOB: April 16, 1951 Today's Date: 01/24/2018    History of Present Illness Pt is a 67 y.o. female with medical history significant for lung cancer with brain metastasis status post remote craniotomy and resection, memory loss, chronic kidney disease stage III, and insomnia, who presented to the emergency department for evaluation of somnolence, confusion, and possible left-sided weakness. CT Head negative; MRI revealing subdural left cerebral convexity with increased signal indicating interval hemorrhage (increase in size).     PT Comments    Patient tolerated treatment well. Patient continues to require physical assistance for transfers and ambulation. Will continue to benefit from skilled PT to maximize function. Current plan of care remains appropriate.   Follow Up Recommendations  SNF;Supervision/Assistance - 24 hour     Equipment Recommendations  None recommended by PT    Recommendations for Other Services       Precautions / Restrictions Precautions Precautions: Fall Restrictions Weight Bearing Restrictions: No    Mobility  Bed Mobility Overal bed mobility: Needs Assistance Bed Mobility: Supine to Sit     Supine to sit: Min assist     General bed mobility comments: Pt requires min assist for trunk support to EOB  Transfers Overall transfer level: Needs assistance Equipment used: 1 person hand held assist Transfers: Sit to/from Stand Sit to Stand: Min assist         General transfer comment: min assist for stability and power up from EOB to standing  Ambulation/Gait Ambulation/Gait assistance: Min assist Gait Distance (Feet): 60 Feet Assistive device: 1 person hand held assist Gait Pattern/deviations: Shuffle;Decreased stride length;Step-to pattern Gait velocity: decreased Gait velocity interpretation: <1.31 ft/sec, indicative of household ambulator General Gait Details: Pt  required hand held assist for stability and balance. VC's for increased cadence. Pt cued to not use railing in hallways for extra external support.    Stairs             Wheelchair Mobility    Modified Rankin (Stroke Patients Only)       Balance Overall balance assessment: History of Falls;Needs assistance Sitting-balance support: Feet supported Sitting balance-Leahy Scale: Fair     Standing balance support: During functional activity;Single extremity supported Standing balance-Leahy Scale: Poor Standing balance comment: Pt prefers to use B UE support in standing                            Cognition Arousal/Alertness: Awake/alert Behavior During Therapy: WFL for tasks assessed/performed Overall Cognitive Status: No family/caregiver present to determine baseline cognitive functioning Area of Impairment: Attention;Memory;Following commands;Safety/judgement;Awareness;Problem solving                   Current Attention Level: Sustained Memory: Decreased short-term memory Following Commands: Follows one step commands consistently;Follows one step commands with increased time Safety/Judgement: Decreased awareness of safety;Decreased awareness of deficits Awareness: Emergent Problem Solving: Slow processing;Decreased initiation;Difficulty sequencing;Requires verbal cues        Exercises      General Comments        Pertinent Vitals/Pain Pain Assessment: Faces Faces Pain Scale: No hurt    Home Living                      Prior Function            PT Goals (current goals can now be found in the care plan section) Acute Rehab PT Goals Patient  Stated Goal: to get home PT Goal Formulation: With patient Time For Goal Achievement: 02/04/18 Potential to Achieve Goals: Good Progress towards PT goals: Progressing toward goals    Frequency    Min 3X/week      PT Plan      Co-evaluation              AM-PAC PT "6 Clicks"  Daily Activity  Outcome Measure  Difficulty turning over in bed (including adjusting bedclothes, sheets and blankets)?: A Little Difficulty moving from lying on back to sitting on the side of the bed? : Unable Difficulty sitting down on and standing up from a chair with arms (e.g., wheelchair, bedside commode, etc,.)?: Unable Help needed moving to and from a bed to chair (including a wheelchair)?: A Little Help needed walking in hospital room?: A Little Help needed climbing 3-5 steps with a railing? : A Lot 6 Click Score: 13    End of Session Equipment Utilized During Treatment: Gait belt Activity Tolerance: Patient limited by fatigue Patient left: in chair;with call bell/phone within reach;with chair alarm set Nurse Communication: Mobility status PT Visit Diagnosis: Unsteadiness on feet (R26.81);Difficulty in walking, not elsewhere classified (R26.2);Ataxic gait (R26.0)     Time: 4315-4008 PT Time Calculation (min) (ACUTE ONLY): 21 min  Charges:  $Gait Training: 8-22 mins                     Selinda Michaels, SPT   Selinda Michaels 01/24/2018, 10:38 AM

## 2018-01-25 DIAGNOSIS — E782 Mixed hyperlipidemia: Secondary | ICD-10-CM | POA: Diagnosis not present

## 2018-01-25 DIAGNOSIS — R404 Transient alteration of awareness: Secondary | ICD-10-CM | POA: Diagnosis not present

## 2018-01-25 DIAGNOSIS — N183 Chronic kidney disease, stage 3 (moderate): Secondary | ICD-10-CM

## 2018-01-25 DIAGNOSIS — G9340 Encephalopathy, unspecified: Secondary | ICD-10-CM | POA: Diagnosis not present

## 2018-01-25 DIAGNOSIS — D649 Anemia, unspecified: Secondary | ICD-10-CM | POA: Diagnosis not present

## 2018-01-25 DIAGNOSIS — I10 Essential (primary) hypertension: Secondary | ICD-10-CM | POA: Diagnosis not present

## 2018-01-25 DIAGNOSIS — Z7401 Bed confinement status: Secondary | ICD-10-CM | POA: Diagnosis not present

## 2018-01-25 DIAGNOSIS — R413 Other amnesia: Secondary | ICD-10-CM | POA: Diagnosis not present

## 2018-01-25 DIAGNOSIS — I6200 Nontraumatic subdural hemorrhage, unspecified: Secondary | ICD-10-CM | POA: Diagnosis not present

## 2018-01-25 DIAGNOSIS — Z85841 Personal history of malignant neoplasm of brain: Secondary | ICD-10-CM | POA: Diagnosis not present

## 2018-01-25 DIAGNOSIS — Z87891 Personal history of nicotine dependence: Secondary | ICD-10-CM | POA: Diagnosis not present

## 2018-01-25 DIAGNOSIS — Z79899 Other long term (current) drug therapy: Secondary | ICD-10-CM | POA: Diagnosis not present

## 2018-01-25 DIAGNOSIS — E039 Hypothyroidism, unspecified: Secondary | ICD-10-CM | POA: Diagnosis not present

## 2018-01-25 DIAGNOSIS — I808 Phlebitis and thrombophlebitis of other sites: Secondary | ICD-10-CM | POA: Diagnosis not present

## 2018-01-25 DIAGNOSIS — G92 Toxic encephalopathy: Secondary | ICD-10-CM | POA: Diagnosis not present

## 2018-01-25 DIAGNOSIS — R531 Weakness: Secondary | ICD-10-CM | POA: Diagnosis not present

## 2018-01-25 DIAGNOSIS — R4781 Slurred speech: Secondary | ICD-10-CM | POA: Diagnosis not present

## 2018-01-25 DIAGNOSIS — S065X9A Traumatic subdural hemorrhage with loss of consciousness of unspecified duration, initial encounter: Secondary | ICD-10-CM | POA: Diagnosis not present

## 2018-01-25 DIAGNOSIS — E785 Hyperlipidemia, unspecified: Secondary | ICD-10-CM | POA: Diagnosis not present

## 2018-01-25 DIAGNOSIS — S065X0D Traumatic subdural hemorrhage without loss of consciousness, subsequent encounter: Secondary | ICD-10-CM | POA: Diagnosis not present

## 2018-01-25 DIAGNOSIS — G4700 Insomnia, unspecified: Secondary | ICD-10-CM | POA: Diagnosis not present

## 2018-01-25 DIAGNOSIS — G9341 Metabolic encephalopathy: Secondary | ICD-10-CM | POA: Diagnosis not present

## 2018-01-25 DIAGNOSIS — R279 Unspecified lack of coordination: Secondary | ICD-10-CM | POA: Diagnosis not present

## 2018-01-25 DIAGNOSIS — Z85118 Personal history of other malignant neoplasm of bronchus and lung: Secondary | ICD-10-CM | POA: Diagnosis not present

## 2018-01-25 DIAGNOSIS — Z7982 Long term (current) use of aspirin: Secondary | ICD-10-CM | POA: Diagnosis not present

## 2018-01-25 DIAGNOSIS — E119 Type 2 diabetes mellitus without complications: Secondary | ICD-10-CM | POA: Diagnosis not present

## 2018-01-25 DIAGNOSIS — C7800 Secondary malignant neoplasm of unspecified lung: Secondary | ICD-10-CM | POA: Diagnosis not present

## 2018-01-25 DIAGNOSIS — C7931 Secondary malignant neoplasm of brain: Secondary | ICD-10-CM | POA: Diagnosis not present

## 2018-01-25 MED ORDER — ACETAMINOPHEN 325 MG PO TABS: 650.0000 mg | ORAL_TABLET | Freq: Four times a day (QID) | ORAL | 0 refills | Status: DC | PRN

## 2018-01-25 NOTE — Discharge Summary (Addendum)
Physician Discharge Summary  Jamie [name] YIR:[id number] DOB: [birth year] DOA: 01/21/2018  PCP: Terald Sleeper, PA-C  Admit date: 01/21/2018 Discharge date: 01/25/2018  Admitted From: Home   Disposition:  SNF   Recommendations for Outpatient Follow-up and new medication changes:  1. Follow up with Particia Nearing PA-C in 7 days.  2. Avoid benzodiazepines   Home Health: na   Equipment/Devices: na    Discharge Condition: stable  CODE STATUS: full  Diet recommendation: heart healthy   Brief/Interim Summary: 67 year old female who presented with confusion, left-sided weakness and decreased responsiveness.  She does have significant past medical history for lung cancer with brain metastasis, dementia, chronic kidney disease stage III and insomnia.  At home patient was noted to be somnolent, poorly responsive and more confused than her baseline.  At home taking 10 mg of diazepam at night.  On her initial physical examination blood pressure 136/83, heart rate 92, respiratory rate 17, oxygen saturation 97%, moist mucous membranes, lungs were clear to auscultation bilaterally, heart S1-S2 present rhythmic, abdomen soft nontender, no lower extremity edema, she was somnolent, very slow to respond to basic questions.  Sodium 142, potassium 4.4, chloride 105, bicarb 27, glucose 119, BUN 11, creatinine 1.15, AST 20, ALT 15, white count 13.4, hemoglobin 14.1, hematocrit 42.1, platelets 285, urine analysis negative for infection.  Drug screen positive for benzodiazepines.  Head CT with no acute intracranial abnormalities.  Chronic white matter changes and left frontal vertex encephalomalacia.  Chest x-ray with bibasilar atelectasis.  EKG with sinus rhythm, normal axis, normal intervals.  Patient was admitted to the hospital with a working diagnosis of acute metabolic/ toxic encephalopathy.  1.  Acute toxic/metabolic encephalopathy.  Patient was admitted to the medical ward, had frequent neuro checks,  benzodiazepines were discontinued.  Patient received supportive medical therapy with improvement of her symptoms.  She was evaluated by physical therapy with recommendations to discharge to skilled nursing facility for rehabilitation.  2.  Subdural left cerebral convexity with increased signal indicated interval hemorrhage. (Present on admission).  In retrospect small subdural hematoma seen on CT on admission.  Further work-up with MRI showed slight increase in thickness of thin subdural collection, 4 mm, over the left cerebral convexity.  Discussed with neurosurgery, recommendations to continue conservative therapy, no surgical intervention, avoid anticoagulants including aspirin.  3.  History of lung cancer with brain metastasis.  Status post left frontoparietal craniotomy.  Continue conservative care, follow-up as an outpatient.  4.  Chronic kidney impairment/dementia, follow-up at [facility].  Continue Aricept.  Avoid benzodiazepines.  5.  Stage III chronic kidney disease.  Follow-up kidney function as an outpatient, discharge creatinine 1.3. Discontinue ibuprofen.   6. Dyslipidemia. Continue rosuvastatin.    7. Right forearm superficial thrombophlebitis. Apply cold compresses, and as needed tylenol for pain control, close follow up.    Discharge Diagnoses:  Principal Problem:   Subdural hematoma (HCC) Active Problems:   Hx of brain cancer   Memory loss   CKD (chronic kidney disease), stage III (HCC)   Acute encephalopathy    Discharge Instructions   Allergies as of 01/25/2018      Reactions   Fosamax [alendronate Sodium] Other (See Comments)   "unknown"   Sulfonamide Derivatives    REACTION: Hives, tongue swells      Medication List    STOP taking these medications   diazepam 10 MG tablet Commonly known as:  VALIUM   ibuprofen 800 MG tablet Commonly known as:  ADVIL,MOTRIN  valACYclovir 1000 MG tablet Commonly known as:  VALTREX     TAKE these  medications   acetaminophen 325 MG tablet Commonly known as:  TYLENOL Take 2 tablets (650 mg total) by mouth every 6 (six) hours as needed for mild pain (or Fever >/= 101).   donepezil 5 MG tablet Commonly known as:  ARICEPT Take 1 tablet (5 mg total) by mouth at bedtime.   folic acid 1 MG tablet Commonly known as:  FOLVITE Take 1 tablet (1 mg total) by mouth daily.   rosuvastatin 10 MG tablet Commonly known as:  CRESTOR TAKE ONE TABLET AT BEDTIME      Contact information for after-discharge care    Destination    [facility] .   Service:  Skilled Nursing Contact information: [place] [phone]             Allergies  Allergen Reactions  . Fosamax [Alendronate Sodium] Other (See Comments)    "unknown"  . Sulfonamide Derivatives     REACTION: Hives, tongue swells    Consultations:     Procedures/Studies: Ct Head Wo Contrast  Result Date: 01/21/2018 CLINICAL DATA:  Acute mental status change. EXAM: CT HEAD WITHOUT CONTRAST TECHNIQUE: Contiguous axial images were obtained from the base of the skull through the vertex without intravenous contrast. COMPARISON:  October 30, 2017 CT scan FINDINGS: Brain: No subdural, epidural, or subarachnoid hemorrhage. Encephalomalacia at the left frontal vertex is stable. Cerebellum, brainstem, and basal cisterns are normal. Ventricles and sulci are unremarkable. White matter changes are stable. No acute cortical ischemia infarct. No mass effect or midline shift. Vascular: No hyperdense vessel or unexpected calcification. Skull: Previous left craniotomy at the vertex frontally. Sinuses/Orbits: Opacification of left mastoid air cells posteriorly and inferiorly are stable. Paranasal sinuses, mastoid air cells, and middle ears are otherwise normal. Other: None. IMPRESSION: No acute intracranial abnormalities identified. Chronic white matter changes and left frontal vertex encephalomalacia.  Electronically Signed   By: Dorise Bullion III M.D   On: 01/21/2018 02:35   Mr Brain Wo Contrast  Result Date: 01/22/2018 CLINICAL DATA:  67 y/o F; acute mental status change. Focal neuro deficit, > 6 hrs, stroke suspected. EXAM: MRI HEAD WITHOUT CONTRAST TECHNIQUE: Multiplanar, multiecho pulse sequences of the brain and surrounding structures were obtained without intravenous contrast. COMPARISON:  01/21/2018 CT head.  10/31/2017 MRI head. FINDINGS: Brain: Interval slight increase in thickness of a thin subdural hematoma over the left cerebral convexity measuring up to 4 mm (series 11, image 18) with increased T1 and T2 FLAIR signal indicating interval hemorrhage. No acute infarction, brain parenchymal hemorrhage, hydrocephalus, extra-axial collection or mass lesion. Chronic left frontal craniotomy postsurgical changes with subjacent encephalomalacia in the left frontal lobe. Stable chronic microvascular ischemic changes and volume loss of the brain. Numerous diffuse foci of susceptibility hypointensity compatible chronic microhemorrhage are present throughout the brain which may represent radiation changes and/or sequelae of chronic hypertension. Vascular: Normal flow voids. Skull and upper cervical spine: Mild mucosal thickening of the paranasal sinuses. Left-greater-than-right mastoid air cell opacification. Bilateral intra-ocular lens replacement. Sinuses/Orbits: Negative. Other: Joint effusion within the left atlantooccipital and atlantoaxial joints, likely degenerative. IMPRESSION: 1. Slight increase in thickness of thin (4 mm) subdural collection over the left cerebral convexity from the prior MRI of the brain with increased signal indicating interval hemorrhage. No associated mass effect. 2. Stable postsurgical changes related to left frontal craniotomy with subjacent encephalomalacia. 3. Stable chronic microvascular ischemic changes  and volume loss of the brain. 4. Stable scattered foci of chronic  microhemorrhage throughout the brain which may be due to chronic hypertension or possibly radiation therapy. Clinical correlation recommended. These results will be called to the ordering clinician or representative by the Radiologist Assistant, and communication documented in the PACS or zVision Dashboard. Electronically Signed   By: Kristine Garbe M.D.   On: 01/22/2018 00:46   Dg Chest Port 1 View  Result Date: 01/21/2018 CLINICAL DATA:  Mental status change. EXAM: PORTABLE CHEST 1 VIEW COMPARISON:  10/30/2017 FINDINGS: The cardiomediastinal contours are unchanged with aortic atherosclerosis and tortuosity. Minor basilar atelectasis. Pulmonary vasculature is normal. No consolidation, pleural effusion, or pneumothorax. No acute osseous abnormalities are seen. IMPRESSION: Mild bibasilar atelectasis. Electronically Signed   By: Keith Rake M.D.   On: 01/21/2018 04:45       Subjective: Slow to respond to questions, denies any chest pain, nausea or vomiting, positive weakness. Limited history due to patient's altered mental status.   Discharge Exam: Vitals:   01/25/18 0754 01/25/18 1141  BP: (!) 104/93 132/85  Pulse: 89 88  Resp: 18 18  Temp: 99 F (37.2 C) 97.7 F (36.5 C)  SpO2: 98% 98%   Vitals:   01/25/18 0437 01/25/18 0500 01/25/18 0754 01/25/18 1141  BP: (!) 146/86  (!) 104/93 132/85  Pulse: 86  89 88  Resp: 18  18 18   Temp: 98.1 F (36.7 C)  99 F (37.2 C) 97.7 F (36.5 C)  TempSrc: Oral  Oral Oral  SpO2: 94%  98% 98%  Weight:  79.4 kg    Height:       General: deconditioned  Neurology: Awake and alert, non focal. Slow to respond to questions, only short simple answers.   E ENT: mild pallor, no icterus, oral mucosa moist Cardiovascular: No JVD. S1-S2 present, rhythmic, no gallops, rubs, or murmurs. No lower extremity edema. Pulmonary: positive breath sounds bilaterally, adequate air movement, no wheezing, rhonchi or rales. Gastrointestinal. Abdomen with  no organomegaly, non tender, no rebound or guarding Skin. Right arm with mild erythema and tenderness, positive induration, at the sie of the peripheral IV.  Musculoskeletal: no joint deformities    The results of significant diagnostics from this hospitalization (including imaging, microbiology, ancillary and laboratory) are listed below for reference.     Microbiology: No results found for this or any previous visit (from the past 240 hour(s)).   Labs: BNP (last 3 results) No results for input(s): BNP in the last 8760 hours. Basic Metabolic Panel: Recent Labs  Lab 01/21/18 0223 01/21/18 0240  NA 142 140  K 4.4 4.2  CL 107 104  CO2 27  --   GLUCOSE 119* 114*  BUN 11 10  CREATININE 1.15* 1.30*  CALCIUM 9.4  --    Liver Function Tests: Recent Labs  Lab 01/21/18 0223  AST 20  ALT 15  ALKPHOS 57  BILITOT 0.6  PROT 7.7  ALBUMIN 5.0   No results for input(s): LIPASE, AMYLASE in the last 168 hours. Recent Labs  Lab 01/21/18 0535  AMMONIA 14   CBC: Recent Labs  Lab 01/21/18 0223 01/21/18 0240 01/21/18 0535  WBC 13.4*  --   --   NEUTROABS 11.0*  --   --   HGB 14.1 13.6  --   HCT 42.1 40.0 39.9  MCV 92.1  --   --   PLT 285  --   --    Cardiac Enzymes: No results for input(s):  CKTOTAL, CKMB, CKMBINDEX, TROPONINI in the last 168 hours. BNP: Invalid input(s): POCBNP CBG: No results for input(s): GLUCAP in the last 168 hours. D-Dimer No results for input(s): DDIMER in the last 72 hours. Hgb A1c No results for input(s): HGBA1C in the last 72 hours. Lipid Profile No results for input(s): CHOL, HDL, LDLCALC, TRIG, CHOLHDL, LDLDIRECT in the last 72 hours. Thyroid function studies No results for input(s): TSH, T4TOTAL, T3FREE, THYROIDAB in the last 72 hours.  Invalid input(s): FREET3 Anemia work up No results for input(s): VITAMINB12, FOLATE, FERRITIN, TIBC, IRON, RETICCTPCT in the last 72 hours. Urinalysis    Component Value Date/Time   COLORURINE  YELLOW 01/21/2018 0424   APPEARANCEUR CLOUDY (A) 01/21/2018 0424   LABSPEC 1.015 01/21/2018 0424   PHURINE 7.0 01/21/2018 0424   GLUCOSEU NEGATIVE 01/21/2018 0424   HGBUR NEGATIVE 01/21/2018 0424   BILIRUBINUR NEGATIVE 01/21/2018 0424   KETONESUR NEGATIVE 01/21/2018 0424   PROTEINUR NEGATIVE 01/21/2018 0424   UROBILINOGEN 0.2 09/08/2011 1045   NITRITE NEGATIVE 01/21/2018 0424   LEUKOCYTESUR NEGATIVE 01/21/2018 0424   Sepsis Labs Invalid input(s): PROCALCITONIN,  WBC,  LACTICIDVEN Microbiology No results found for this or any previous visit (from the past 240 hour(s)).   Time coordinating discharge: 45 minutes  SIGNED:   Tawni Millers, MD  [facility] 01/25/2018, 12:35 PM Pager [phone]  If 7PM-7AM, please contact night-coverage [email or web address] Password [email or web address]

## 2018-01-25 NOTE — Progress Notes (Addendum)
Attempted to call report x 2 to [facility], left voicemail. Will attempt again.  Gave report to [place], Therapist, sports at [facility]

## 2018-01-25 NOTE — Clinical Social Work Placement (Signed)
Nurse to call report to [phone], Room [place]  NOTE  Date:  01/25/2018  Patient Details  Name: Jamie [name] MRN: [id number] Date of Birth: Sep 04, 1950  Clinical Social Work is seeking post-discharge placement for this patient at the [place] level of care (*CSW will initial, date and re-position this form in  chart as items are completed):  Yes   Patient/family provided with [place] Work Department's list of facilities offering this level of care within the geographic area requested by the patient (or if unable, by the patient's family).  Yes   Patient/family informed of their freedom to choose among providers that offer the needed level of care, that participate in Medicare, Medicaid or managed care program needed by the patient, have an available bed and are willing to accept the patient.  Yes   Patient/family informed of [facility]'s ownership interest in [facility] and [facility], as well as of the fact that they are under no obligation to receive care at these facilities.  PASRR submitted to EDS on 01/24/18     PASRR number received on 01/24/18     Existing PASRR number confirmed on       FL2 transmitted to all facilities in geographic area requested by pt/family on 01/24/18     FL2 transmitted to all facilities within larger geographic area on       Patient informed that his/her managed care company has contracts with or will negotiate with certain facilities, including the following:        Yes   Patient/family informed of bed offers received.  Patient chooses bed at [facility]     Physician recommends and patient chooses bed at      Patient to be transferred to [facility] on 01/25/18.  Patient to be transferred to facility by [facility]     Patient family notified on 01/25/18 of transfer.  Name of family member notified:  Son     PHYSICIAN       Additional Comment:     _______________________________________________ Geralynn Ochs, LCSW 01/25/2018, 2:02 PM

## 2018-01-25 NOTE — Progress Notes (Addendum)
When removing IV, area of redness and heat with nodule noted in upper arm. MD notified. MD to assess.  MD said will manage with pain meds at facility, not urgent matter

## 2018-01-31 DIAGNOSIS — I6200 Nontraumatic subdural hemorrhage, unspecified: Secondary | ICD-10-CM | POA: Diagnosis not present

## 2018-01-31 DIAGNOSIS — G9341 Metabolic encephalopathy: Secondary | ICD-10-CM | POA: Diagnosis not present

## 2018-01-31 DIAGNOSIS — C7800 Secondary malignant neoplasm of unspecified lung: Secondary | ICD-10-CM | POA: Diagnosis not present

## 2018-02-07 DIAGNOSIS — C7931 Secondary malignant neoplasm of brain: Secondary | ICD-10-CM | POA: Diagnosis not present

## 2018-02-10 ENCOUNTER — Telehealth: Payer: Self-pay | Admitting: Physician Assistant

## 2018-02-16 ENCOUNTER — Other Ambulatory Visit: Payer: Self-pay

## 2018-02-16 NOTE — Patient Outreach (Signed)
[facility]) Care Management  02/16/2018  [name] 1951-01-21 [id number]   Medication Adherence call to Mrs. Jamie [name] spoke with patient she said she already order Rosuvastatin 10 mg and will pick up tomorrow on her way to the doctor's office patient is due on Rosuvastatin 10 mg under [place].   [place] Management Direct Dial [phone]  Fax [phone] [name]

## 2018-03-01 ENCOUNTER — Other Ambulatory Visit: Payer: Self-pay | Admitting: Physician Assistant

## 2018-05-12 ENCOUNTER — Ambulatory Visit: Payer: Medicare Other | Admitting: Physician Assistant

## 2018-05-18 ENCOUNTER — Telehealth: Payer: Self-pay | Admitting: Physician Assistant

## 2018-05-18 NOTE — Telephone Encounter (Signed)
Can you do this letter, it is okay with me.

## 2018-05-18 NOTE — Telephone Encounter (Signed)
[name], caregiver for Ileene Rubens, is needing a letter written for her employer to have time off to help care for Ms. Kunde and not lose her job.  Letter needs to state that Shirlean Mylar is the "primary caregiver" to Ileene Rubens and will need intermittent days off to care for her as needed.  Please use start date of April 19, 2018.   If you could do this for her and call when ready to pick up.  Thank you.

## 2018-05-19 NOTE — Telephone Encounter (Signed)
Letter placed up front to be picked up.

## 2018-06-05 ENCOUNTER — Ambulatory Visit (INDEPENDENT_AMBULATORY_CARE_PROVIDER_SITE_OTHER): Payer: Medicare Other | Admitting: Physician Assistant

## 2018-06-05 ENCOUNTER — Encounter: Payer: Self-pay | Admitting: Physician Assistant

## 2018-06-05 VITALS — BP 130/76 | HR 68 | Temp 98.5°F | Ht 67.0 in | Wt 171.2 lb

## 2018-06-05 DIAGNOSIS — R4182 Altered mental status, unspecified: Secondary | ICD-10-CM

## 2018-06-05 DIAGNOSIS — N183 Chronic kidney disease, stage 3 unspecified: Secondary | ICD-10-CM

## 2018-06-05 DIAGNOSIS — R413 Other amnesia: Secondary | ICD-10-CM | POA: Diagnosis not present

## 2018-06-05 DIAGNOSIS — E785 Hyperlipidemia, unspecified: Secondary | ICD-10-CM

## 2018-06-05 DIAGNOSIS — G9340 Encephalopathy, unspecified: Secondary | ICD-10-CM

## 2018-06-05 DIAGNOSIS — Z9181 History of falling: Secondary | ICD-10-CM
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
## 2018-06-06 LAB — CMP14+EGFR
ALT: 18 IU/L (ref 0–32)
AST: 17 IU/L (ref 0–40)
Albumin/Globulin Ratio: 2.7 — ABNORMAL HIGH (ref 1.2–2.2)
Albumin: 4.8 g/dL (ref 3.8–4.8)
Alkaline Phosphatase: 61 IU/L (ref 39–117)
BUN/Creatinine Ratio: 10 — ABNORMAL LOW (ref 12–28)
BUN: 10 mg/dL (ref 8–27)
Bilirubin Total: 0.3 mg/dL (ref 0.0–1.2)
CO2: 25 mmol/L (ref 20–29)
Calcium: 9.4 mg/dL (ref 8.7–10.3)
Chloride: 104 mmol/L (ref 96–106)
Creatinine, Ser: 1.02 mg/dL — ABNORMAL HIGH (ref 0.57–1.00)
GFR calc Af Amer: 67 mL/min/{1.73_m2} (ref >59)
GFR calc non Af Amer: 58 mL/min/{1.73_m2} — ABNORMAL LOW (ref >59)
Globulin, Total: 1.8 g/dL (ref 1.5–4.5)
Glucose: 92 mg/dL (ref 65–99)
Potassium: 3.8 mmol/L (ref 3.5–5.2)
Sodium: 144 mmol/L (ref 134–144)
Total Protein: 6.6 g/dL (ref 6.0–8.5)

## 2018-06-06 LAB — CBC WITH DIFFERENTIAL/PLATELET
Basophils Absolute: 0.1 10*3/uL (ref 0.0–0.2)
Basos: 1 %
EOS (ABSOLUTE): 0.2 10*3/uL (ref 0.0–0.4)
Eos: 3 %
Hematocrit: 38.7 % (ref 34.0–46.6)
Hemoglobin: 13.2 g/dL (ref 11.1–15.9)
Immature Grans (Abs): 0 10*3/uL (ref 0.0–0.1)
Immature Granulocytes: 0 %
Lymphocytes Absolute: 1.7 10*3/uL (ref 0.7–3.1)
Lymphs: 20 %
MCH: 30.1 pg (ref 26.6–33.0)
MCHC: 34.1 g/dL (ref 31.5–35.7)
MCV: 88 fL (ref 79–97)
Monocytes Absolute: 0.4 10*3/uL (ref 0.1–0.9)
Monocytes: 5 %
Neutrophils Absolute: 6.2 10*3/uL (ref 1.4–7.0)
Neutrophils: 71 %
Platelets: 279 10*3/uL (ref 150–450)
RBC: 4.38 x10E6/uL (ref 3.77–5.28)
RDW: 13 % (ref 11.7–15.4)
WBC: 8.6 10*3/uL (ref 3.4–10.8)

## 2018-06-06 LAB — LIPID PANEL
Chol/HDL Ratio: 3 ratio (ref 0.0–4.4)
Cholesterol, Total: 144 mg/dL (ref 100–199)
HDL: 48 mg/dL (ref >39)
LDL Calculated: 53 mg/dL (ref 0–99)
Triglycerides: 214 mg/dL — ABNORMAL HIGH (ref 0–149)
VLDL Cholesterol Cal: 43 mg/dL — ABNORMAL HIGH (ref 5–40)

## 2018-06-07 ENCOUNTER — Ambulatory Visit: Payer: Self-pay | Admitting: Licensed Clinical Social Worker

## 2018-06-07 DIAGNOSIS — Z9181 History of falling: Secondary | ICD-10-CM

## 2018-06-07 DIAGNOSIS — R413 Other amnesia: Secondary | ICD-10-CM

## 2018-06-07 DIAGNOSIS — E785 Hyperlipidemia, unspecified: Secondary | ICD-10-CM

## 2018-06-07 NOTE — Patient Instructions (Signed)
Licensed Clinical Social Worker Visit Information   Materials provided: No  Jamie [name] was given information about Chronic Care Management services today including:  1. CCM service includes personalized support from designated clinical staff supervised by her physician, including individualized plan of care and coordination with other care providers 2. 24/7 contact phone numbers for assistance for urgent and routine care needs. 3. Service will only be billed when office clinical staff spend 20 minutes or more in a month to coordinate care. 4. Only one practitioner may furnish and bill the service in a calendar month. 5. The patient may stop CCM services at any time (effective at the end of the month) by phone call to the office staff. 6. The patient will be responsible for cost sharing (co-pay) of up to 20% of the service fee (after annual deductible is met).  Patient did not agree to services and wishes to consider information provided before deciding about enrollment in CCM services.    Follow Up Plan:  LCSW to call client in one week to further discuss with client CCM services available.  The patient verbalized understanding of instructions provided today and declined a print copy of patient instruction materials.   Norva Riffle.Aicha Clingenpeel MSW, LCSW Licensed Clinical Social Worker [facility] [phone]

## 2018-06-07 NOTE — Chronic Care Management (AMB) (Signed)
  Chronic Care Management    Clinical Social Work CCM Outreach Note  06/07/2018 Name: Jamie [name] MRN: [id number] DOB: 11-Jul-1950  LCSW reached out to Jamie [name] today by phone to introduce and offer CCM services.   Ms. Straka was given information about Chronic Care Management services today including:  1. CCM service includes personalized support from designated clinical staff supervised by her physician, including individualized plan of care and coordination with other care providers 2. 24/7 contact phone numbers for assistance for urgent and routine care needs. 3. Service will only be billed when office clinical staff spend 20 minutes or more in a month to coordinate care. 4. Only one practitioner may furnish and bill the service in a calendar month. 5. The patient may stop CCM services at any time (effective at the end of the month) by phone call to the office staff. 6. The patient will be responsible for cost sharing (co-pay) of up to 20% of the service fee (after annual deductible is met).  Patient did not agree to services and wishes to consider information provided before deciding about enrollment in CCM services.   Follow Up Plan: LCSW to call client in one week to further discuss with client CCM services available.  Norva Riffle.Jehad Bisono MSW, LCSW Licensed Clinical Social Worker [facility] [phone]

## 2018-06-09 LAB — TOXASSURE SELECT 13 (MW), URINE

## 2018-06-13 ENCOUNTER — Ambulatory Visit (INDEPENDENT_AMBULATORY_CARE_PROVIDER_SITE_OTHER): Payer: Medicare Other | Admitting: Licensed Clinical Social Worker

## 2018-06-13 ENCOUNTER — Telehealth: Payer: Self-pay | Admitting: Physician Assistant

## 2018-06-13 DIAGNOSIS — E785 Hyperlipidemia, unspecified: Secondary | ICD-10-CM

## 2018-06-13 DIAGNOSIS — Z9181 History of falling: Secondary | ICD-10-CM

## 2018-06-13 DIAGNOSIS — R413 Other amnesia: Secondary | ICD-10-CM

## 2018-06-13 NOTE — Chronic Care Management (AMB) (Signed)
Chronic Care Management    Clinical Social Work General Note  06/13/2018 Name: BOBIE CARIS MRN: [id number] DOB: 1950-10-14  Referred by: PCP, Particia Nearing PA-C for psychosocial assessment and community resources information  Ms. Kohlman was given information about Chronic Care Management services today including:  1. CCM service includes personalized support from designated clinical staff supervised by her physician, including individualized plan of care and coordination with other care providers 2. 24/7 contact phone numbers for assistance for urgent and routine care needs. 3. Service will only be billed when office clinical staff spend 20 minutes or more in a month to coordinate care. 4. Only one practitioner may furnish and bill the service in a calendar month. 5. The patient may stop CCM services at any time (effective at the end of the month) by phone call to the office staff. 6. The patient will be responsible for cost sharing (co-pay) of up to 20% of the service fee (after annual deductible is met).  Patient agreed to services and verbal consent obtained.   Review of patient status, including review of consultants reports, relevant laboratory and other test results, and collaboration with appropriate care team members and the patient's provider was performed as part of comprehensive patient evaluation and provision of chronic care management services.    Last CCM Appointment: 06/13/2018  Depression screen [facility] 2/9 06/05/2018 01/10/2018 10/24/2017  Decreased Interest _0 Down, Depressed, Hopeless _1 PHQ - 2 Score _2 Altered sleeping 1 0 2  Tired, decreased energy _3 Change in appetite 0 0 0  Feeling bad or failure about yourself  0 0 0  Trouble concentrating 0 0 3  Moving slowly or fidgety/restless 0 0 3  Suicidal thoughts 0 0 0  PHQ-9 Score _4 Regarding Social Determinants of Health, LCSW has talked with client and [name], caregiver of client. Client  lives at her mobile home with son and friends of son.  Client is having stress related to home environment situation.  [name] has informed LCSW that client would like to look for an apartment in the [place] are where she could live alone. Client, per Shirlean Mylar, is having memory issues and would like to be alone in her home environment and have a set daily routine.  Current Barriers:  . Financial constraints . Limited social support . Housing barriers  . Family member living with client but not paying any bills with client  Goal:  Patient Stated:  "I want help in improving my living situation and want to look for other housing in area"  Clinical Social Work Clinical Goal(s):  Marland Kitchen Over the next 30 days, client will work with SW to address concerns related to housing issues of client and discuss housing options for client in area.  Interventions: . Patient interviewed and appropriate assessments performed . Provided patient with information about CCM program services  .  Provided education to patient/caregiver regarding level of care options. for client    Encouraged client or [name] to call LCSW as needed to discuss psychosocial needs of client  Patient Self Care Activities:  . Attends all scheduled provider appointments . Performs ADL's independently . Contacts [name], caregiver, to discuss client needs  Plan:  . Patient will contact LCSW as needed to discuss psychosocial needs of client . LCSW willcall client/Robin Joyner,caregiver, in two weeks to assess client housing needs at that time. . Patient  will communicate with RN CM as needed to discuss nursing needs of client  *initial goal documentation  Follow Up Plan: LCSW to call client/Robin Berkley Harvey, caregiver, in two weeks to assess current housing needs of client at that time.  Norva Riffle.Joao Mccurdy MSW, LCSW Licensed Clinical Social Worker [facility] [phone]

## 2018-06-13 NOTE — Patient Instructions (Signed)
Licensed Clinical Social Worker Visit Information  Materials Provided:  No  Ms. Tamala Julian Shirlean Mylar Berkley Harvey, caregiver, were given information about Chronic Care Management services today including:  1. CCM service includes personalized support from designated clinical staff supervised by her physician, including individualized plan of care and coordination with other care providers 2. 24/7 contact phone numbers for assistance for urgent and routine care needs. 3. Service will only be billed when office clinical staff spend 20 minutes or more in a month to coordinate care. 4. Only one practitioner may furnish and bill the service in a calendar month. 5. The patient may stop CCM services at any time (effective at the end of the month) by phone call to the office staff. 6. The patient will be responsible for cost sharing (co-pay) of up to 20% of the service fee (after annual deductible is met).  Patient agreed to services and verbal consent obtained.    Current Barriers:   Financial constraints  Limited social support  Housing barriers   Family member living with client but not paying any bills with client  Goal:  Patient Stated:  "I want help in improving my living situation and want to look for other housing in area"  Clinical Social Work Clinical Goal(s):   Over the next 30 days, client will work with SW to address concerns related to housing issues of client and discuss housing options for client in area.  Interventions:  Patient interviewed and appropriate assessments performed  Provided patient with information about CCM program services    Provided education to patient/caregiver regarding level of care options. for client    Encouraged client or [name] to call LCSW as needed to discuss psychosocial needs of client  Patient Self Care Activities:   Attends all scheduled provider appointments  Performs ADL's independently  Contacts [name], caregiver, to  discuss client needs  Plan:   Patient will contact LCSW as needed to discuss psychosocial needs of client  LCSW willcall client/Robin Joyner,caregiver, in two weeks to assess client housing needs at that time.  Patient will communicate with RN CM as needed to discuss nursing needs of client  *initial goal documentation  Follow Up Plan: LCSW to call client/Robin Berkley Harvey, caregiver, in two weeks to assess current housing needs of client at that time.  The patient [name], caregiver, verbalized understanding of instructions provided today and declined a print copy of patient instruction materials.   Norva Riffle.Amarianna Abplanalp MSW, LCSW Licensed Clinical Social Worker [facility] [phone]

## 2018-06-14 ENCOUNTER — Ambulatory Visit: Payer: Self-pay | Admitting: Licensed Clinical Social Worker

## 2018-06-14 DIAGNOSIS — E785 Hyperlipidemia, unspecified: Secondary | ICD-10-CM

## 2018-06-14 DIAGNOSIS — R413 Other amnesia: Secondary | ICD-10-CM

## 2018-06-14 DIAGNOSIS — Z9181 History of falling: Secondary | ICD-10-CM

## 2018-06-14 NOTE — Chronic Care Management (AMB) (Signed)
Care Management Note   Jamie [name] is a 68 y.o. year old female who sees Jamie Sleeper, PA-C for primary care.Jamie Nearing PA-C asked the CCM team to consult the patient for assistance with chronic disease management related psychosocial assessment and housing resources.   Review of patient status, including review of consultants reports, relevant laboratory and other test results, and collaboration with appropriate care team members and the patient's provider was performed as part of comprehensive patient evaluation and provision of chronic care management services. Telephone outreach to patient today to introduce CCM services.   I reached out to Jamie [name] by phone today.   Jamie [name] was given information about Chronic Care Management services today including:  1. CCM service includes personalized support from designated clinical staff supervised by her physician, including individualized plan of care and coordination with other care providers 2. 24/7 contact phone numbers for assistance for urgent and routine care needs. 3. Service will only be billed when office clinical staff spend 20 minutes or more in a month to coordinate care. 4. Only one practitioner may furnish and bill the service in a calendar month. 5. The patient may stop CCM services at any time (effective at the end of the month) by phone call to the office staff. 6. The patient will be responsible for cost sharing (co-pay) of up to 20% of the service fee (after annual deductible is met).  Patient agreed to services and verbal consent obtained.       Regarding Social Determinants of Health, Jamie [name] has talked with Jamie [name] and Jamie [name], caregiver of Jamie [name]. Jamie [name] lives at her mobile home with son and friends of son.  Jamie [name] is having stress related to home environment situation.  Jamie [name] has informed Jamie [name] that Jamie [name] would like to look for an apartment in the [place] are where she could live alone. Jamie [name], per  Jamie [name], is having memory issues and would like to be alone in her home environment and have a set daily routine.However, on Jamie [name] phone call today with Jamie [name], Jamie [name] said she would rather stay at her mobile home and have son and others move to another residence.  Current Barriers:   Financial constraints  Limited social support  Housing barriers   Family member living with Jamie [name] but not paying any bills with Jamie [name]  Goal:  Patient Stated:  "I want help in improving my living situation and want to look for other housing in area"  Clinical Social Work Clinical Goal(s):   Over the next 30 days, Jamie [name] will work with SW to address concerns related to housing issues of Jamie [name] and discuss housing options for Jamie [name] in area.  Interventions:  Patient interviewed and appropriate assessments performed    Provided education to patient/caregiver regarding level of care options. for Jamie [name]    Encouraged Jamie [name] or Jamie [name] to call Jamie [name] as needed to discuss psychosocial needs of Jamie [name]  Talked with Jamie [name] about Jamie [name] safety status  Encouraged Jamie [name] to talk with son and friends about paying some of the household bills  Patient Self Care Activities:   Attends all scheduled provider appointments  Performs ADL's independently  Contacts Jamie [name], caregiver, to discuss Jamie [name] needs  Plan:   Patient will contact Jamie [name] as needed to discuss psychosocial needs of client  Jamie [name] willcall Jamie [name]/Jamie [name],caregiver, in two weeks to assess Jamie [name] housing needs at that time.  Patient will communicate with RN CM as needed to discuss nursing needs of Jamie [name]  Jamie [name] to  call 911 for emergency assistance as needed  *initial goal documentation  Follow Up Plan: Jamie [name] to call Jamie [name]/Jamie [name], caregiver, in two weeks to assess current housing needs of Jamie [name] at that time.  Jamie [name].Jamie [name] MSW, Jamie [name] Licensed Clinical Social Worker [facility]  [facility] [phone]

## 2018-06-14 NOTE — Patient Instructions (Signed)
Licensed Clinical Water engineer Provided:  No  Goals we discussed today:    Current Barriers:  Financial constraints  Limited social support  Housing barriers  Family member living with client but not paying any bills with client  Goal: Patient Stated: "I want help in improving my living situation and want to look for other housing in area"  Clinical Social Work Clinical Goal(s):  Over the next30days, client will work with SW to address concerns related to housing issues of client and discuss housing options for client in area.  Interventions:  Patient interviewed and appropriate assessments performed   Provided education to patient/caregiver regarding level of care options.for client   Encouraged client or [name] to call LCSW as needed to discuss psychosocial needs of client  Talked with client about client safety status  Encouraged client to talk with son and friends about paying some of the household bills  Patient Self Care Activities:  Attends all scheduled provider appointments  Performs ADL's independently  Contacts [name], caregiver, to discuss client needs  Plan:  Patient willcontact LCSW as needed to discuss psychosocial needs of client  LCSW willcall client/Robin Joyner,caregiver, in two weeks to assess client housing needs at that time.  Patient willcommunicate with RN CM as needed to discuss nursing needs of client  Client to call 911 for emergency assistance as needed  *initial goal documentation  Follow Up Plan:LCSW to call client/Robin Berkley Harvey, caregiver, in two weeks to assess current housing needs of client at that time.  The patient verbalized understanding of instructions provided today and declined a print copy of patient instruction materials.    Norva Riffle.Delano Scardino MSW, LCSW Licensed Clinical Social Worker [facility] [phone]

## 2018-06-27 ENCOUNTER — Ambulatory Visit (INDEPENDENT_AMBULATORY_CARE_PROVIDER_SITE_OTHER): Payer: Medicare Other | Admitting: Licensed Clinical Social Worker

## 2018-06-27 DIAGNOSIS — R413 Other amnesia: Secondary | ICD-10-CM

## 2018-06-27 DIAGNOSIS — Z9181 History of falling: Secondary | ICD-10-CM

## 2018-06-27 DIAGNOSIS — E785 Hyperlipidemia, unspecified: Secondary | ICD-10-CM | POA: Diagnosis not present

## 2018-06-27 NOTE — Chronic Care Management (AMB) (Signed)
  Chronic Care Management    Clinical Social Work General Follow Up Note  06/27/2018 Name: CHRISTINNA SPRUNG MRN: [id number] DOB: 12-05-1950  MAXCINE STRONG is a 68 y.o. year old female who is a primary care patient of Terald Sleeper, PA-C. Terald Sleeper, PA-C  asked the CCM team to consult the patient for assistance with psychosocial assessment.   Review of patient status, including review of consultants reports, relevant laboratory and other test results, and collaboration with appropriate care team members and the patient's provider was performed as part of comprehensive patient evaluation and provision of chronic care management services.    Depression screen PHQ 2/9 06/05/2018  Decreased Interest 1  Down, Depressed, Hopeless 1  PHQ - 2 Score 2  Altered sleeping 1  Tired, decreased energy 1  Change in appetite 0  Feeling bad or failure about yourself  0  Trouble concentrating 0  Moving slowly or fidgety/restless 0  Suicidal thoughts 0  PHQ-9 Score 4    Social Determinants of Health:  LCSW has talked with client  and [name], caregiver of client. Client lives at her mobile home with son and friends of son. Client is having stress related to home environment situation.  Client has mentioned her looking for another residence in area.  Client is having some memory issues, per [name].On recent call with client, LCSW was informed that client would now rather stay at her mobile home and have son and others to move to another residence. .   Goals Addressed            This Visit's Progress   . Client said she wants help in improving her living situation and wants to look for other housing options in area. (pt-stated)       Current Barriers:  . Financial constraints . Limited social support . Housing barriers . Family member living with client but not paying any bills with client  Clinical Social Work Clinical Goal(s):  Marland Kitchen Over next 30 days, client will work with LCSW to address  concerns related to housing issues of client and discuss housing options for client in the area.   Interventions: . Provided education to patient/caregiver regarding level of care options for client . Encouraged client or [name] to call LCSW to discuss psychosocial needs of client . Talked with client about client safety status . Encouraged client to talk with son and friends about paying some of the household bills.  . Encouraged client to call RN CM to discuss nursing needs of client  Patient Self Care Activities:  . Attends all scheduled provider appointments  . Performs ADL's independently . Contacts [name], caregiver to discuss client needs  Plan:  . Patient will contact LCSW to discuss psychosical needs of client  . LCSW will call client/Robin Berkley Harvey, caregiver in three weeks to assess client housing needs . Patient will communicate with RN CM to discuss nursing needs of client . Client to call 911 for emergency assistance as needed.   Initial goal documentation       Follow Up Plan: LCSW to call client in 3 weeks to assess client housing situation and housing needs of client at that time.   Norva Riffle.Johnnie Goynes MSW, LCSW Licensed Clinical Social Worker [facility] [phone]

## 2018-06-27 NOTE — Patient Instructions (Signed)
Licensed Clinical Social Worker Visit Information  Goals we discussed today:  Goals Addressed            This Visit's Progress   . Client said she wants help in improving her living situation and wants to look for other housing options in area. (pt-stated)       Current Barriers:  . Financial constraints . Limited social support . Housing barriers . Family member living with client but not paying any bills with client  Clinical Social Work Clinical Goal(s):  Marland Kitchen Over next 30 days, client will work with LCSW to address concerns related to housing issues of client and discuss housing options for client in the area.   Interventions: . Patient interviewed and appropriate assessments performed  . Provided education to patient/caregiver regarding level of care options for client . Encouraged client or [name] to call LCSW to discuss psychosocial needs of client . Talked with client about client safety status . Encouraged client to talk with son and friends about paying some of the household bills.   Patient Self Care Activities:  . Attends all scheduled provider appointments  . Performs ADL's independently . Contacts [name], caregiver to discuss client needs  Plan:  . Patient will contact LCSW to discuss psychosical needs of client  . LCSW will call client/Robin Berkley Harvey, caregiver in three weeks to assess client housing needs . Patient will communicate with RN CM to discuss nursing needs of client . Client to call 911 for emergency assistance as needed.   Initial goal documentation       Materials Provided: No  Follow Up Plan: LCSW to call client in 3 weeks to to assess client housing needs at that time.  The patient verbalized understanding of instructions provided today and declined a print copy of patient instruction materials.   Norva Riffle.Sheddrick Lattanzio MSW, LCSW Licensed Clinical Social Worker [facility] [phone]

## 2018-07-17 ENCOUNTER — Ambulatory Visit: Payer: Self-pay | Admitting: Licensed Clinical Social Worker

## 2018-07-17 DIAGNOSIS — R413 Other amnesia: Secondary | ICD-10-CM

## 2018-07-17 DIAGNOSIS — N183 Chronic kidney disease, stage 3 unspecified: Secondary | ICD-10-CM

## 2018-07-17 DIAGNOSIS — Z9181 History of falling: Secondary | ICD-10-CM

## 2018-07-17 DIAGNOSIS — E785 Hyperlipidemia, unspecified: Secondary | ICD-10-CM

## 2018-07-17 NOTE — Chronic Care Management (AMB) (Signed)
  Care Management Note   Jamie [name] is a 68 y.o. year old female who is a primary care patient of Terald Sleeper, PA-C. The CM team was consulted for assistance with chronic disease management and care coordination.   I reached out to [name] by phone today.   Review of patient status, including review of consultants reports, relevant laboratory and other test results, and collaboration with appropriate care team members and the patient's provider was performed as part of comprehensive patient evaluation and provision of chronic care management services.   Social Determinants of Health: At risk for Depression  Goals Addressed            This Visit's Progress   . Client said she wants help in improving her living situation and wants to look for other housing options in area. (pt-stated)       Current Barriers:  . Financial constraints . Limited social support . Housing barriers . Family member living with client but not paying any bills with client  Clinical Social Work Clinical Goal(s):  Marland Kitchen Over next 30 days, client will work with LCSW to address concerns related to housing issues of client and discuss housing options for client in the area.   Interventions: . Encouraged client or [name] to call LCSW to discuss psychosocial needs of client . Talked with client about client safety status . Encouraged client to talk with son and friends about paying some of the household bills.  . Talked with client about support she receives from friend, [name] . Talked with client about client's current residence situation . Provided counseling support for client  Patient Self Care Activities:  . Attends all scheduled provider appointments  . Performs ADL's independently . Contacts [name], caregiver to discuss client needs  Plan:  . Patient will contact LCSW to discuss psychosical needs of client  . LCSW will call client/Robin Berkley Harvey, caregiver in three weeks to  assess client housing needs . Patient will communicate with RN CM to discuss nursing needs of client . Client to call 911 for emergency assistance as needed.  . Client to attend scheduled medical appointments  Initial goal documentation    LCSW talked with client about housing situation. Client said that her son and friends are paying a little on some of the household bills. She said that one household member is cooking routinely for group and this is helpful. She said son and friends are paying for their own grocery items. Client said she planned to talk with son about his paying a utility bill each month and that this could be very helpful financially to client to help with monthly household expenses. Client said she felt safe at home currently and she said she is planning on remaining at current location. She said:  "This is my home; I own it. "  Follow Up Plan: LCSW will call client/Robin Joyner in 3 weeks to assess housing needs of client at that time.  Norva Riffle.Jacorian Golaszewski MSW, LCSW Licensed Clinical Social Worker [facility] [phone]

## 2018-07-17 NOTE — Patient Instructions (Addendum)
Licensed Clinical Social Worker Visit Information  Goals we discussed today:  Goals Addressed            This Visit's Progress   . Client said she wants help in improving her living situation and wants to look for other housing options in area. (pt-stated)       Current Barriers:  . Financial constraints . Limited social support . Housing barriers . Family member living with client but not paying any bills with client  Clinical Social Work Clinical Goal(s):  Marland Kitchen Over next 30 days, client will work with LCSW to address concerns related to housing issues of client and discuss housing options for client in the area.   Interventions: . Encouraged client or [name] to call LCSW to discuss psychosocial needs of client  .  Talked with client about client safety status   .  Encouraged client to talk with son and friends about paying some of the household bills.  . Talked with client about support she receives from friend, [name] . Provided counseling support for client. . Talked with client about client's current residence situation  Patient Self Care Activities:  . Attends all scheduled provider appointments  . Performs ADL's independently . Contacts [name], caregiver to discuss client needs  Plan:  . Patient will contact LCSW to discuss psychosical needs of client  . LCSW will call client/Robin Berkley Harvey, caregiver in three weeks to assess client housing needs . Patient will communicate with RN CM to discuss nursing needs of client . Client to call 911 for emergency assistance as needed.   Initial goal documentation    Materials Provided: No  Follow Up Plan: LCSW will call client/Robin Berkley Harvey, caregiver, in 3 weeks to assess client housing needs  The patient verbalized understanding of instructions provided today and declined a print copy of patient instruction materials.   Norva Riffle.Leahmarie Gasiorowski MSW, LCSW Licensed Clinical Social Worker [facility] [phone]

## 2018-07-20 ENCOUNTER — Ambulatory Visit: Payer: Medicare Other | Admitting: *Deleted

## 2018-07-20 DIAGNOSIS — Z9181 History of falling: Secondary | ICD-10-CM

## 2018-07-20 DIAGNOSIS — R413 Other amnesia: Secondary | ICD-10-CM

## 2018-07-26 NOTE — Patient Instructions (Signed)
Visit Information  Goals Addressed    . "I don't want to fall" (pt-stated)       Current Barriers:  . Lacks caregiver support.  . Cognitive Deficits  Nurse Case Manager Clinical Goal(s):  Marland Kitchen Over the next 7 days, patient will verbalize understanding of plan for fall prevention.  Interventions:  . Advised patient to move carefully and change positions slowly. . Provided education to patient re: fall prevention strategies in the home  . Recommended to use her walker for support and balance  Patient Self Care Activities:  . Currently UNABLE TO independently drive or manage her home and finances . Performs most ADL's independently       The patient verbalized understanding of instructions provided today and declined a print copy of patient instruction materials.   The CM team will reach out to the patient again over the next 7 days.   [name], RN-BC, BSN Nurse Case Manager [place] [phone]

## 2018-07-26 NOTE — Chronic Care Management (AMB) (Signed)
  Chronic Care Management   Initial Note RN Case Management   07/20/2018 Name: Jamie [name] MRN: [id number] DOB: 05-19-1950  Referred by: Terald Sleeper, PA-C Reason for referral : Chronic Care Management (RNCM telephone call to discuss nursing care needs)   Jamie [name] is a 68 y.o. year old female who is a primary care patient of Terald Sleeper, PA-C. The CCM team was consulted for assistance with chronic disease management and care coordination needs.    Review of patient status, including review of consultants reports, relevant laboratory and other test results, and collaboration with CCM LCSW was performed as part of comprehensive patient evaluation and provision of chronic care management services.    I spoke with Jamie [name] by telephone today. But the phone call disconnected after 11 minutes and I was unable to reach her again after multiple attempts. We were able to complete some screenings for Social Determinants of Health.   Subjective: Jamie [name] lives with her son and she has help from her friend Constance Holster. She has a walker but does not use it often. She is not very physically active and actually stated that she doesn't go outdoors. In the past she would walk to the mailbox but she says that her and his girlfriend have the mailbox keys and so she doesn't go anymore. She has steps onto her porch with one railing and no other steps in the home. She is not able to manage her finances, housekeeping, or medications due to decline in memory.   Goals Addressed    . "I don't want to fall" (pt-stated)       Current Barriers:  . Lacks caregiver support.  . Cognitive Deficits  Nurse Case Manager Clinical Goal(s):  Marland Kitchen Over the next 7 days, patient will verbalize understanding of plan for fall prevention.  Interventions:  . Advised patient to move carefully and change positions slowly. . Provided education to patient re: fall prevention strategies in the home  . Recommended to use her  walker for support and balance  Patient Self Care Activities:  . Currently UNABLE TO independently drive or manage her home and finances . Performs most ADL's independently  Initial goal documentation       Patient Instructions: Verbal education provided on fall prevention. Recommended to move carefully and change positions slowly. Use walker if sufficient room in home. Keep a light on at night for trips to the bathroom. Encouraged to move around more and to go outside at least once a day when the weather permits. Do not go out in the heat of the day.   Plan RNCM will reach out to patient via telephone over the next 7 days to complete initial visit. (Length of time is due to Social Distancing Orders and scheduled time off)   [name], RN-BC, BSN Nurse Case Manager [place] [phone]

## 2018-08-07 ENCOUNTER — Ambulatory Visit (INDEPENDENT_AMBULATORY_CARE_PROVIDER_SITE_OTHER): Payer: Medicare Other | Admitting: Licensed Clinical Social Worker

## 2018-08-07 DIAGNOSIS — M810 Age-related osteoporosis without current pathological fracture: Secondary | ICD-10-CM | POA: Diagnosis not present

## 2018-08-07 DIAGNOSIS — N183 Chronic kidney disease, stage 3 unspecified: Secondary | ICD-10-CM

## 2018-08-07 DIAGNOSIS — Z9181 History of falling: Secondary | ICD-10-CM

## 2018-08-07 DIAGNOSIS — E785 Hyperlipidemia, unspecified: Secondary | ICD-10-CM | POA: Diagnosis not present

## 2018-08-07 DIAGNOSIS — R413 Other amnesia: Secondary | ICD-10-CM

## 2018-08-07 NOTE — Chronic Care Management (AMB) (Signed)
  Care Management Note   Jamie [name] is a 68 y.o. year old female who is a primary care patient of Terald Sleeper, PA-C. The CM team was consulted for assistance with chronic disease management and care coordination.   I reached out to [name] by phone today.   Review of patient status, including review of consultants reports, relevant laboratory and other test results, and collaboration with appropriate care team members and the patient's provider was performed as part of comprehensive patient evaluation and provision of chronic care management services.   Social Determinants of Health:At Risk for Depression  Depression screen [facility] 2/9 07/17/2018 06/05/2018 01/10/2018 10/24/2017 02/16/2017  Decreased Interest 1 1 1 1 2   Down, Depressed, Hopeless 1 1 1 2 3   PHQ - 2 Score 2 2 2 3 5   Altered sleeping 1 1 0 2 2  Tired, decreased energy 1 1 1 2 2   Change in appetite 0 0 0 0 0  Feeling bad or failure about yourself  0 0 0 0 0  Trouble concentrating 0 0 0 3 0  Moving slowly or fidgety/restless 0 0 0 3 3  Suicidal thoughts 0 0 0 0 0  PHQ-9 Score 4 4 3 13 12   Difficult doing work/chores Somewhat difficult - - - -    Goals Addressed            This Visit's Progress   . Client said she wants help in improving her living situation and wants to look for other housing options in area. (pt-stated)       Current Barriers:  . Financial constraints . Limited social support . Housing barriers . Family member living with client but not paying any bills with client  Clinical Social Work Clinical Goal(s):  Marland Kitchen Over next 30 days, client will work with LCSW to address concerns related to housing issues of client and discuss housing options for client in the area.   Interventions: . Encouraged client or [name] to call LCSW to discuss psychosocial needs of client . Talked with client about client safety status . Encouraged client to talk with son and friends about paying some of the  household bills.  .  Encouraged client to use relaxation techniques of choice to help her manage stress issues (listens to church music, watch TV, watches religious programs)  Patient Self Care Activities:  . Attends all scheduled provider appointments  . Performs ADL's independently . Contacts [name], caregiver to discuss client needs  Plan:  . Patient will contact LCSW to discuss psychosical needs of client  . LCSW will call client/Robin Berkley Harvey, caregiver in three weeks to assess client housing needs . Patient will communicate with RN CM to discuss nursing needs of client . Client to call 911 for emergency assistance as needed.  . Client to attend scheduled medical appointments  Initial goal documentation     Client spoke of eye issues. She said she is having some trouble seeing. She has used reading glasses previously. She said she is eating adequately and sleeping adequately.  She said son and girlfriend are helping with meal preparation and obtaining food items needed. She said she feels safe at current residence and plans to continue residing at current residence  Follow Up Plan: LCSW will call client in the next 3 weeks to assess client housing needs at that time.  Norva Riffle.Toinette Lackie MSW, LCSW Licensed Clinical Social Worker [facility] [phone]

## 2018-08-07 NOTE — Patient Instructions (Signed)
Licensed Clinical Social Worker Visit Information  Goals we discussed today:  Goals Addressed            This Visit's Progress   . Client said she wants help in improving her living situation and wants to look for other housing options in area. (pt-stated)       Current Barriers:  . Financial constraints . Limited social support . Housing barriers . Family member living with client but not paying any bills with client  Clinical Social Work Clinical Goal(s):  Marland Kitchen Over next 30 days, client will work with LCSW to address concerns related to housing issues of client and discuss housing options for client in the area.   Interventions: . Encouraged client or [name] to call LCSW to discuss psychosocial needs of client . Talked with client about client safety status . Encouraged client to talk with son and friends about paying some of the household bills.  . Encouraged client to use relaxation techniques of choice to help her manage stress issues (watches TV, listens go [employer], watches religious programs on TV)  Patient Self Care Activities:  . Attends all scheduled provider appointments  . Performs ADL's independently . Contacts [name], caregiver to discuss client needs  Plan:  . Patient will contact LCSW to discuss psychosical needs of client  . LCSW will call client/Robin Berkley Harvey, caregiver in three weeks to assess client housing needs . Patient will communicate with RN CM to discuss nursing needs of client . Client to call 911 for emergency assistance as needed.  . Client to attend scheduled client medical appointments  Initial goal documentation    Materials Provided: No  Follow Up Plan: LCSW will call client in next 3 weeks to assess client housing needs at that time  The patient verbalized understanding of instructions provided today and declined a print copy of patient instruction materials.   Norva Riffle.Gem Conkle MSW, LCSW Licensed Clinical Social  Worker [facility] [phone]

## 2018-08-23 ENCOUNTER — Other Ambulatory Visit: Payer: Self-pay

## 2018-08-23 ENCOUNTER — Encounter: Payer: Self-pay | Admitting: Physician Assistant

## 2018-08-23 ENCOUNTER — Ambulatory Visit (INDEPENDENT_AMBULATORY_CARE_PROVIDER_SITE_OTHER): Payer: Medicare Other | Admitting: Physician Assistant

## 2018-08-23 DIAGNOSIS — N3000 Acute cystitis without hematuria: Secondary | ICD-10-CM

## 2018-08-23 MED ORDER — LORATADINE 10 MG PO TABS: 10.0000 mg | ORAL_TABLET | Freq: Every day | ORAL | 11 refills | Status: DC

## 2018-08-23 MED ORDER — CEPHALEXIN 250 MG PO CAPS: 250.0000 mg | ORAL_CAPSULE | Freq: Four times a day (QID) | ORAL | 0 refills | Status: DC

## 2018-08-23 NOTE — Progress Notes (Signed)
Jamie [name]  Bladder Frequent Depends     Telephone visit  Subjective: CC:uti PCP: Terald Sleeper, PA-C MMN:Jamie [name] is a 68 y.o. female calls for telephone consult today. Patient provides verbal consent for consult held via phone.  Patient is identified with 2 separate identifiers.  At this time the entire area is on COVID-19 social distancing and stay home orders are in place.  Patient is of higher risk and therefore we are performing this by a virtual method.  Location of patient: home Location of provider: HOME Others present for call: no  This patient has had several days of dysuria, frequency and nocturia. There is also pain over the bladder in the suprapubic region, no back pain. Denies leakage or hematuria.  Denies fever or chills. No pain in flank area.   ROS: Per HPI  Allergies  Allergen Reactions  . Fosamax [Alendronate Sodium] Other (See Comments)    "unknown"  . Sulfonamide Derivatives     REACTION: Hives, tongue swells   Past Medical History:  Diagnosis Date  . Brain cancer ([place])   . Cancer ([place])   . CKD (chronic kidney disease), stage III ([place])   . Dementia ([place])   . High cholesterol   . Insomnia   . Lung cancer ([place])   . Osteoporosis     Current Outpatient Medications:  .  cephALEXin (KEFLEX) 250 MG capsule, Take 1 capsule (250 mg total) by mouth 4 (four) times daily., Disp: 40 capsule, Rfl: 0 .  donepezil (ARICEPT) 5 MG tablet, Take 1 tablet (5 mg total) by mouth at bedtime., Disp: 30 tablet, Rfl: 11 .  folic acid (FOLVITE) 1 MG tablet, TAKE 1 TABLET DAILY, Disp: 90 tablet, Rfl: 0 .  Ibuprofen-diphenhydrAMINE Cit (ADVIL PM PO), Take by mouth at bedtime., Disp: , Rfl:  .  loratadine (CLARITIN) 10 MG tablet, Take 1 tablet (10 mg total) by mouth daily., Disp: 30 tablet, Rfl: 11 .  Multiple Vitamin (MULTIVITAMIN) tablet, Take 1 tablet by mouth daily., Disp: , Rfl:  .  rosuvastatin (CRESTOR) 10 MG tablet, TAKE ONE TABLET AT BEDTIME, Disp: 90  tablet, Rfl: 3  Assessment/ Plan: 68 y.o. female   1. Acute cystitis without hematuria - cephALEXin (KEFLEX) 250 MG capsule; Take 1 capsule (250 mg total) by mouth 4 (four) times daily.  Dispense: 40 capsule; Refill: 0   Start time: 11:16 AM End time: 11:21.AM   Meds ordered this encounter  Medications  . cephALEXin (KEFLEX) 250 MG capsule    Sig: Take 1 capsule (250 mg total) by mouth 4 (four) times daily.    Dispense:  40 capsule    Refill:  0    Order Specific Question:   Supervising Provider    Answer:   Janora Norlander [[id number]]  . loratadine (CLARITIN) 10 MG tablet    Sig: Take 1 tablet (10 mg total) by mouth daily.    Dispense:  30 tablet    Refill:  11    Order Specific Question:   Supervising Provider    Answer:   Janora Norlander [[id number]]    Particia Nearing PA-C [place] [phone]

## 2018-08-28 ENCOUNTER — Ambulatory Visit (INDEPENDENT_AMBULATORY_CARE_PROVIDER_SITE_OTHER): Payer: Medicare Other | Admitting: Licensed Clinical Social Worker

## 2018-08-28 DIAGNOSIS — R413 Other amnesia: Secondary | ICD-10-CM

## 2018-08-28 DIAGNOSIS — E785 Hyperlipidemia, unspecified: Secondary | ICD-10-CM

## 2018-08-28 DIAGNOSIS — M810 Age-related osteoporosis without current pathological fracture: Secondary | ICD-10-CM | POA: Diagnosis not present

## 2018-08-28 DIAGNOSIS — N183 Chronic kidney disease, stage 3 unspecified: Secondary | ICD-10-CM

## 2018-08-28 DIAGNOSIS — Z9181 History of falling: Secondary | ICD-10-CM

## 2018-08-28 NOTE — Patient Instructions (Signed)
Licensed Clinical Social Worker Visit Information  Goals we discussed today:  Goals Addressed            This Visit's Progress   . Client said she wants help in improving her living situation and wants to look for other housing options in area. (pt-stated)       Current Barriers:  . Financial constraints . Limited social support  .  Housing barriers   Clinical Social Work Clinical Goal(s):  Marland Kitchen Over next 30 days, client will work with LCSW to address concerns related to housing issues of client and discuss housing options for client in the area.   Interventions: . Encouraged client or Jamie [name] to call LCSW to discuss psychosocial needs of client . Talked with client about client safety status . Encouraged client to talk with son and friends about paying some of the household bills.  . Encouraged client to use relaxation techniques of choice to help her manage stress issues (watch TV, observe nature scenes or birds outdoors, listen to music)  Patient Self Care Activities:  . Attends all scheduled provider appointments  . Performs ADL's independently . Contacts Jamie [name], caregiver to discuss client needs  Plan:  . Patient will contact LCSW to discuss psychosical needs of client  . LCSW will call [name], caregiver in three weeks to assess client housing needs . Patient will communicate with RN CM to discuss nursing needs of client . Client to call 911 for emergency assistance as needed.  . Client to attend scheduled client medical appointments  Initial goal documentation      Materials Provided: No  Follow Up Plan: LCSW to call client in next 3 weeks to assess client housing needs at that time.  The patient verbalized understanding of instructions provided today and declined a print copy of patient instruction materials.   Jamie [name].Jamie [name] MSW, LCSW Licensed Clinical Social Worker [facility] Care Management [phone]

## 2018-08-28 NOTE — Chronic Care Management (AMB) (Signed)
  Care Management Note   Jamie [name] is a 68 y.o. year old female who is a primary care patient of Jamie Sleeper, PA-C. The CM team was consulted for assistance with chronic disease management and care coordination.   I reached out to Jamie [name] by phone today.   Review of patient status, including review of consultants reports, relevant laboratory and other test results, and collaboration with appropriate care team members and the patient's provider was performed as part of comprehensive patient evaluation and provision of chronic care management services.   Social Determinants of Health:Risk for Depression    Chronic Care Management from 07/17/2018 in [place]  PHQ-9 Total Score  4     Goals Addressed            This Visit's Progress   . Client said she wants help in improving her living situation and wants to look for other housing options in area. (pt-stated)       Current Barriers:  . Financial constraints . Limited social support . Housing barriers   Clinical Social Work Clinical Goal(s):  Marland Kitchen Over next 30 days, client will work with LCSW to address concerns related to housing issues of client and discuss housing options for client in the area.   Interventions: . Encouraged client or Jamie [name] to call LCSW to discuss psychosocial needs of client . Talked with client about client safety status . Encouraged client to talk with son and friends about paying some of the household bills.  . Encouraged client to use relaxation techniques of choice to help her manage stress issues (watch TV,look at nature scenes or birds outdoors, listen to music)  Patient Self Care Activities:  . Attends all scheduled provider appointments  . Performs ADL's independently . Contacts Jamie [name], caregiver to discuss client needs  . Plan:   . Patient will contact LCSW to discuss psychosical needs of client  . LCSW will call [name], caregiver in  three weeks to assess client housing needs . Patient will communicate with RN CM to discuss nursing needs of client . Client to call 911 for emergency assistance as needed.  . Client to attend scheduled client medical appointments  Initial goal documentation     Client said that her son helps with some of the bills each month. She said that son and his girlfriend do grocery shopping and do most of the cooking in the home; this food procurement and preparation is helpful to client. Client said she has her prescribed medications. She said she is doing well with ADLs.  She has difficulty in walking (balance).She has a cane and walker to help in ambulation. She did not report any pain issues. She said she is sleeping adequately. She said she feels safe at her home and wants to continue to living there at present.  Client said her friend, Jamie [name] manages client's finances for client.  Client has had some difficulty walking and has talked with RNCM about ambulation challenges   Follow Up Plan: LCSW to call client in next 3 weeks to assess client housing needs at that time  Norva Riffle.Jamie [name] MSW, LCSW Licensed Clinical Social Worker [facility] Care Management [phone]

## 2018-09-06 ENCOUNTER — Ambulatory Visit (INDEPENDENT_AMBULATORY_CARE_PROVIDER_SITE_OTHER): Payer: Medicare Other | Admitting: Physician Assistant

## 2018-09-06 ENCOUNTER — Encounter: Payer: Self-pay | Admitting: Physician Assistant

## 2018-09-06 ENCOUNTER — Other Ambulatory Visit: Payer: Self-pay

## 2018-09-06 DIAGNOSIS — R413 Other amnesia: Secondary | ICD-10-CM

## 2018-09-06 DIAGNOSIS — J301 Allergic rhinitis due to pollen: Secondary | ICD-10-CM | POA: Diagnosis not present

## 2018-09-06 DIAGNOSIS — E538 Deficiency of other specified B group vitamins: Secondary | ICD-10-CM | POA: Insufficient documentation

## 2018-09-06 DIAGNOSIS — E785 Hyperlipidemia, unspecified: Secondary | ICD-10-CM

## 2018-09-06 MED ORDER — CETIRIZINE HCL 10 MG PO TABS: 10.0000 mg | ORAL_TABLET | Freq: Every day | ORAL | 3 refills | Status: DC

## 2018-09-06 MED ORDER — DONEPEZIL HCL 5 MG PO TABS: 5.0000 mg | ORAL_TABLET | Freq: Every day | ORAL | 3 refills | Status: DC

## 2018-09-06 MED ORDER — FOLIC ACID 1 MG PO TABS: 1.0000 mg | ORAL_TABLET | Freq: Every day | ORAL | 3 refills | Status: DC

## 2018-09-06 NOTE — Progress Notes (Signed)
Telephone visit  Subjective: CC: Chronic medical conditions PCP: Terald Sleeper, PA-C XNA:TFTDD Jamie [name] is a 68 y.o. female calls for telephone consult today. Patient provides verbal consent for consult held via phone.  Patient is identified with 2 separate identifiers.  At this time the entire area is on COVID-19 social distancing and stay home orders are in place.  Patient is of higher risk and therefore we are performing this by a virtual method.  Location of patient: Home Location of provider: [facility] Others present for call: No  The patient seems very clear today and was a very good historian.  She was able to have all of her medications we went over each 1.  She does need some refills.  Her medical history does include memory impairment, history of brain tumor, hyperlipidemia, allergic rhinitis, folate deficiency.  She states that she is doing very well.  Some family members are still living at her home.  And she has decided not to leave at this point.  She is working with our chronic care management team in case services are needed for shelter.   ROS: Per HPI  Allergies  Allergen Reactions  . Fosamax [Alendronate Sodium] Other (See Comments)    "unknown"  . Sulfonamide Derivatives     REACTION: Hives, tongue swells   Past Medical History:  Diagnosis Date  . Brain cancer ([place])   . Cancer ([place])   . CKD (chronic kidney disease), stage III ([place])   . Dementia ([place])   . High cholesterol   . Insomnia   . Lung cancer ([place])   . Osteoporosis     Current Outpatient Medications:  .  cetirizine (ZYRTEC) 10 MG tablet, Take 1 tablet (10 mg total) by mouth daily., Disp: 90 tablet, Rfl: 3 .  donepezil (ARICEPT) 5 MG tablet, Take 1 tablet (5 mg total) by mouth at bedtime., Disp: 90 tablet, Rfl: 3 .  folic acid (FOLVITE) 1 MG tablet, Take 1 tablet (1 mg total) by mouth daily., Disp: 90 tablet, Rfl: 3 .  Ibuprofen-diphenhydrAMINE Cit (ADVIL PM PO), Take by mouth at bedtime., Disp: ,  Rfl:  .  Multiple Vitamin (MULTIVITAMIN) tablet, Take 1 tablet by mouth daily., Disp: , Rfl:  .  rosuvastatin (CRESTOR) 10 MG tablet, TAKE ONE TABLET AT BEDTIME, Disp: 90 tablet, Rfl: 3  Assessment/ Plan: 68 y.o. female   1. Memory impairment - donepezil (ARICEPT) 5 MG tablet; Take 1 tablet (5 mg total) by mouth at bedtime.  Dispense: 90 tablet; Refill: 3  2. Hyperlipidemia, unspecified hyperlipidemia type Labs in the summertime  3. Seasonal allergic rhinitis due to pollen - cetirizine (ZYRTEC) 10 MG tablet; Take 1 tablet (10 mg total) by mouth daily.  Dispense: 90 tablet; Refill: 3  4. Folate deficiency - folic acid (FOLVITE) 1 MG tablet; Take 1 tablet (1 mg total) by mouth daily.  Dispense: 90 tablet; Refill: 3   Start time: 9:13 AM End time: 9:21 AM  Meds ordered this encounter  Medications  . folic acid (FOLVITE) 1 MG tablet    Sig: Take 1 tablet (1 mg total) by mouth daily.    Dispense:  90 tablet    Refill:  3    $    Order Specific Question:   Supervising Provider    Answer:   Janora Norlander [[id number]]  . donepezil (ARICEPT) 5 MG tablet    Sig: Take 1 tablet (5 mg total) by mouth at bedtime.    Dispense:  90 tablet    Refill:  3    Order Specific Question:   Supervising Provider    Answer:   Janora Norlander [[id number]]  . cetirizine (ZYRTEC) 10 MG tablet    Sig: Take 1 tablet (10 mg total) by mouth daily.    Dispense:  90 tablet    Refill:  3    Order Specific Question:   Supervising Provider    Answer:   Janora Norlander [[id number]]    Particia Nearing PA-C [place] [phone]

## 2018-09-18 ENCOUNTER — Ambulatory Visit (INDEPENDENT_AMBULATORY_CARE_PROVIDER_SITE_OTHER): Payer: Medicare Other | Admitting: Licensed Clinical Social Worker

## 2018-09-18 DIAGNOSIS — Z9181 History of falling: Secondary | ICD-10-CM

## 2018-09-18 DIAGNOSIS — M810 Age-related osteoporosis without current pathological fracture: Secondary | ICD-10-CM | POA: Diagnosis not present

## 2018-09-18 DIAGNOSIS — N183 Chronic kidney disease, stage 3 unspecified: Secondary | ICD-10-CM

## 2018-09-18 DIAGNOSIS — E785 Hyperlipidemia, unspecified: Secondary | ICD-10-CM

## 2018-09-18 DIAGNOSIS — R413 Other amnesia: Secondary | ICD-10-CM

## 2018-09-18 NOTE — Patient Instructions (Signed)
Licensed Clinical Social Worker Visit Information  Goals we discussed today:  Goals Addressed            This Visit's Progress   . Client said she wants help in improving her living situation and wants to look for other housing options in area. (pt-stated)       Current Barriers:  . Financial constraints . Limited social support  .  Housing barriers  Clinical Social Work Clinical Goal(s):  Marland Kitchen Over next 30 days, client will work with LCSW to address concerns related to housing issues of client and discuss housing options for client in the area.   Interventions: . Encouraged client or [name] to call LCSW to discuss psychosocial needs of client . Talked with client about client safety status . Encouraged client to talk with son and friends about paying some of the household bills.  . Encouraged client to use relaxation techniques of choice to help her manage stress issues . Talked with client about self care activities of choice  Patient Self Care Activities:  . Attends all scheduled provider appointments  . Performs ADL's independently . Contacts [name], caregiver to discuss client needs  Plan:  . Patient will contact LCSW to discuss psychosical needs of client  . LCSW will call client/Robin Berkley Harvey, caregiver in three weeks to assess client housing needs . Patient will communicate with RN CM to discuss nursing needs of client . Client to call 911 for emergency assistance as needed.  . Client to attend scheduled client medical appointments  Initial goal documentation      Materials Provided: No  Follow Up Plan: LCSW will call client in 3 weeks to assess client housing needs  The patient verbalized understanding of instructions provided today and declined a print copy of patient instruction materials.   Norva Riffle.Redith Drach MSW, LCSW Licensed Clinical Social Worker [facility] Care Management [phone]

## 2018-09-18 NOTE — Chronic Care Management (AMB) (Signed)
  Care Management Note   Jamie [name] is a 68 y.o. year old female who is a primary care patient of Terald Sleeper, PA-C. The CM team was consulted for assistance with chronic disease management and care coordination.   I reached out to [name] by phone today  Review of patient status, including review of consultants reports, relevant laboratory and other test results, and collaboration with appropriate care team members and the patient's provider was performed as part of comprehensive patient evaluation and provision of chronic care management services.   Social Determinants of Health:Risk for Depression    Chronic Care Management from 07/17/2018 in [place]  PHQ-9 Total Score  4     Goals Addressed            This Visit's Progress   . Client said she wants help in improving her living situation and wants to look for other housing options in area. (pt-stated)       Current Barriers:  . Financial constraints . Limited social support . Housing barriers  Clinical Social Work Clinical Goal(s):  Marland Kitchen Over next 30 days, client will work with LCSW to address concerns related to housing issues of client and discuss housing options for client in the area.   Interventions: . Encouraged client or [name] to call LCSW to discuss psychosocial needs of client . Talked with client about client safety status . Encouraged client to talk with son and friends about paying some of the household bills.  . Encouraged client to use relaxation techniques of choice to help her manage stress issues . Talked with client about self care activities of choice  Patient Self Care Activities:  . Attends all scheduled provider appointments  . Performs ADL's independently . Contacts [name], caregiver to discuss client needs  Plan:  . Patient will contact LCSW to discuss psychosical needs of client  . LCSW will call client/Robin Berkley Harvey, caregiver in three weeks to  assess client housing needs . Patient will communicate with RN CM to discuss nursing needs of client . Client to call 911 for emergency assistance as needed.  . Client to attend scheduled client medical appointments  Initial goal documentation      Client spoke of eye issue. She said she had appointment with eye specialist in April of 2020. She said she sees eye specialist nearby. She did not speak of any pain issues. She has prescribed medications and is taking medications as prescribed.  She has not had any falls recently. But, she uses a cane or walker as needed to ambulate. She said her son and his girlfriend help procure food and prepare meals for client.  She has support from friend [name].  Client wants to continue residing at current location. She did not mention any safety issues. She likes to visit with family or friends to help her relax.  She said she has some difficulty sleeping occasionally. She said her friend, [name], helps to manage finances of client.  Follow Up Plan: LCSW will call client in next 3 weeks to talk with client about housing situation of client and to talk with her about social work needs of client  Norva Riffle.Selicia Windom MSW, LCSW Licensed Clinical Social Worker [facility] Care Management [phone]

## 2018-10-09 ENCOUNTER — Ambulatory Visit: Payer: Self-pay | Admitting: Licensed Clinical Social Worker

## 2018-10-09 DIAGNOSIS — E785 Hyperlipidemia, unspecified: Secondary | ICD-10-CM

## 2018-10-09 DIAGNOSIS — Z9181 History of falling: Secondary | ICD-10-CM

## 2018-10-09 DIAGNOSIS — N183 Chronic kidney disease, stage 3 unspecified: Secondary | ICD-10-CM

## 2018-10-09 DIAGNOSIS — M810 Age-related osteoporosis without current pathological fracture: Secondary | ICD-10-CM

## 2018-10-09 DIAGNOSIS — R413 Other amnesia: Secondary | ICD-10-CM

## 2018-10-09 NOTE — Chronic Care Management (AMB) (Signed)
Chronic Care Management    Clinical Social Work CCM Outreach Note  10/09/2018 Name: Jamie [name] MRN: [id number] DOB: 07/20/1950  Jamie [name] is a 68 y.o. year old female who is a primary care patient of Terald Sleeper, PA-C . The CCM team was consulted for assistance with assessment of psychosocial needs.   LCSW reached out to Jamie [name] today by phone    Social Determinants of Health: Risk for tobacco exposure ; Risk for Depression    Chronic Care Management from 07/17/2018 in [place]  PHQ-9 Total Score  4     Goals    . Client said she wants help in improving her living situation and wants to look for other housing options in area. (pt-stated)     Current Barriers:  . Financial constraints . Limited social support . Housing barriers  Clinical Social Work Clinical Goal(s):  Jamie [name] Kitchen Over next 30 days, client will work with LCSW to address concerns related to housing issues of client and discuss housing options for client in the area.   Interventions: . Encouraged client to call LCSW to discuss psychosocial needs of client . Talked with client about client safety status . Encouraged client to continue to talk with son and friends about paying some of the household bills.  . Encouraged client to use relaxation techniques of choice to help her manage stress issues . Talked with client about self care activities of choice  Patient Self Care Activities:  . Attends all scheduled provider appointments  . Performs ADL's independently . Contacts Jamie [name], caregiver to discuss client needs  Plan:  . Patient will contact LCSW to discuss psychosical needs of client   . LCSW will call client in three weeks to assess client's social work needs at that time.  . Patient will communicate with RN CM to discuss nursing needs of client . Client to call 911 for emergency assistance as needed.  . Client to attend scheduled client medical appointments  Initial  goal documentation   Client has prescribed medications and is taking medications as prescribed.  She said she had a fall at her front door in past week but did not have any serious injury . Client said she uses a cane or walker as needed to ambulate. She said her son and his girlfriend help procure food and prepare meals for client.  She has support from friend Jamie [name].  Client wants to continue residing at current location. She did not mention any safety issues. She likes to visit with family or friends to help her relax.  She said she has some difficulty sleeping occasionally. She said her friend, Jamie [name], helps to manage finances of client. Client said she wants to  continue to stay at her current residence. However, client is seeming to have a few more falls and seems to need more help with ADLs. Her son, Jamie [name] spoke with LCSW via phone on 10/09/2018. Jamie [name] and his girlfriend reside at home of client and help client in the home. Jamie [name] suggested that home health aide may be helpful with client to help client periodically in bathing and ADLs support.  Client has [employer].  Jamie [name] said that client has cane and walker but sometimes does not use these devices to help her walk and this may contribute to her falls periodically. LCSW thanked Jamie [name] (son of client) and Jamie [name]  (client) for phone call with LCSW on 10/09/2018  Follow Up Plan: LCSW  to call client in next 3 weeks to assess client's social work needs at that time.   Norva Riffle.Lillianah Swartzentruber MSW, LCSW Licensed Clinical Social Worker [facility] Care Management [phone]

## 2018-10-09 NOTE — Patient Instructions (Signed)
Licensed Clinical Social Worker Visit Information  Goals we discussed today:  Goals Addressed            This Visit's Progress   . Client said she wants help in improving her living situation and wants to look for other housing options in area. (pt-stated)       Current Barriers:  . Financial constraints . Limited social support  .  Housing barriers   Clinical Social Work Clinical Goal(s):  Marland Kitchen Over next 30 days, client will work with LCSW to address concerns related to housing issues of client and discuss housing options for client in the area.   Interventions: . Encouraged client to call LCSW to discuss psychosocial needs of client . Talked with client about client safety status . Encouraged client to talk with son and friends about paying some of the household bills.  . Encouraged client to use relaxation techniques of choice to help her manage stress issues . Talked with client about self care activities of choice  Patient Self Care Activities:  . Attends all scheduled provider appointments  . Performs ADL's independently . Contacts [name], caregiver to discuss client needs  Plan:  . Patient will contact LCSW to discuss psychosical needs of client   . LCSW will call client in next three weeks to assess social work needs of client . at that time . Patient will communicate with RN CM to discuss nursing needs of client . Client to call 911 for emergency assistance as needed.  . Client to attend scheduled client medical appointments  Initial goal documentation      Materials Provided: No  Follow Up Plan: LCSW to call client in next 3 weeks to assess social work needs of client at  that time.  The patient verbalized understanding of instructions provided today and declined a print copy of patient instruction materials.   Norva Riffle.Shanena Pellegrino MSW, LCSW Licensed Clinical Social Worker [facility] Care Management [phone]

## 2018-10-17 ENCOUNTER — Ambulatory Visit: Payer: Medicare Other | Admitting: *Deleted

## 2018-10-17 DIAGNOSIS — Z9181 History of falling: Secondary | ICD-10-CM

## 2018-10-17 DIAGNOSIS — R413 Other amnesia: Secondary | ICD-10-CM

## 2018-10-17 NOTE — Chronic Care Management (AMB) (Signed)
.  ccmfollow Chronic Care Management   Telephone Follow Up Note   10/17/2018 Name: Jamie [name] MRN: [id number] DOB: Nov 24, 1950  Referred by: Terald Sleeper, PA-C Reason for referral : Chronic Care Management (RNCM Follow Up)   Jamie [name] is a 68 y.o. year old female who is a primary care patient of Terald Sleeper, PA-C. The CCM team was consulted for assistance with chronic disease management and care coordination needs.    I placed a follow-up telephone call to [name] today but I was unable to speak with her. Her voicemail picked up but I was unable to leave a message.   Follow Up Plan The care management team will reach out to the patient again over the next 14 days.    [name], RN-BC, BSN Nurse Care Manager [facility] [phone]

## 2018-10-21 DIAGNOSIS — R5381 Other malaise: Secondary | ICD-10-CM | POA: Diagnosis not present

## 2018-10-21 DIAGNOSIS — R531 Weakness: Secondary | ICD-10-CM | POA: Diagnosis not present

## 2018-10-30 ENCOUNTER — Ambulatory Visit (INDEPENDENT_AMBULATORY_CARE_PROVIDER_SITE_OTHER): Payer: Medicare Other | Admitting: Licensed Clinical Social Worker

## 2018-10-30 DIAGNOSIS — R413 Other amnesia: Secondary | ICD-10-CM

## 2018-10-30 DIAGNOSIS — N183 Chronic kidney disease, stage 3 unspecified: Secondary | ICD-10-CM

## 2018-10-30 DIAGNOSIS — Z9181 History of falling: Secondary | ICD-10-CM

## 2018-10-30 DIAGNOSIS — E785 Hyperlipidemia, unspecified: Secondary | ICD-10-CM | POA: Diagnosis not present

## 2018-10-30 DIAGNOSIS — M810 Age-related osteoporosis without current pathological fracture: Secondary | ICD-10-CM | POA: Diagnosis not present

## 2018-10-30 NOTE — Patient Instructions (Signed)
Licensed Clinical Social Worker Visit Information  Goals we discussed today:  Goals Addressed            This Visit's Progress   . Client said she wants help in improving her living situation and wants to look for other housing options in area. (pt-stated)       Current Barriers:  . Financial constraints . Limited social support  Clinical Social Work Clinical Goal(s):  Marland Kitchen Over next 30 days, client will work with LCSW to address concerns related to housing issues of client and discuss housing options for client in the area.   Interventions: . Previously encouraged client to call LCSW to discuss psychosocial needs of client . Previously talked with client about client safety status . Previously encouraged client to talk with son and friends about paying some of the household bills.  . Previously encouraged client to use relaxation techniques of choice to help her manage stress issues . Talked with client about self care activities of choice  Patient Self Care Activities:  . Attends all scheduled provider appointments  . Performs ADL's independently . Contacts [name], caregiver to discuss client needs  Plan:  . Patient will contact LCSW to discuss psychosical needs of client   . LCSW will call client in three weeks to assess client housing needs . Patient will communicate with RN CM to discuss nursing needs of client . Client to call 911 for emergency assistance as needed.  . Client to attend scheduled client medical appointments  Initial goal documentation        Materials Provided: No  Follow Up Plan: LCSW to call client in next 3 weeks to assess client housing needs at that time  The patient verbalized understanding of instructions provided today and declined a print copy of patient instruction materials.   Norva Riffle.Jamie [name] MSW, LCSW Licensed Clinical Social Worker [facility] [phone]

## 2018-10-30 NOTE — Chronic Care Management (AMB) (Signed)
  Care Management Note   Jamie [name] is a 68 y.o. year old female who is a primary care patient of Terald Sleeper, PA-C. The CM team was consulted for assistance with chronic disease management and care coordination.   I reached out to Jamie [name] by phone today.   Review of patient status, including review of consultants reports, relevant laboratory and other test results, and collaboration with appropriate care team members and the patient's provider was performed as part of comprehensive patient evaluation and provision of chronic care management services.   Social Determinants of Health: Risk of tobacco use; Risk of Depression    Chronic Care Management from 07/17/2018 in [place]  PHQ-9 Total Score  4     Goals Addressed            This Visit's Progress   . Client said she wants help in improving her living situation and wants to look for other housing options in area. (pt-stated)       Current Barriers:  . Financial constraints . Limited social support  Clinical Social Work Clinical Goal(s):  Marland Kitchen Over next 30 days, client will work with LCSW to address concerns related to housing issues of client and discuss housing options for client in the area.   Interventions: . Previously encouraged client to call LCSW to discuss psychosocial needs of client . Previously talked with client about client safety status . Previously encouraged client to talk with son and friends about paying some of the household bills.  . Previously encouraged client to use relaxation techniques of choice to help her manage stress issues . Talked with client about self care activities of choice  Patient Self Care Activities:  . Attends all scheduled provider appointments  . Performs ADL's independently . Contacts [name], caregiver to discuss client needs  Plan:  . Patient will contact LCSW to discuss psychosical needs of client   . LCSW will call client in three  weeks to assess client housing needs . Patient will communicate with RN CM to discuss nursing needs of client . Client to call 911 for emergency assistance as needed.  . Client to attend scheduled client medical appointments  Initial goal documentation     Client has prescribed medications and is taking medications as prescribed. Client said she uses a cane or walker as needed to ambulate. She said her son and his girlfriend help procure food and prepare meals for client. She has support from friend [name]. Client wants to continue residing at current location. She did not mention any safety issues. She likes to visit with family or friends to help her relax. She said she has some difficulty sleeping occasionally. She said her friend, [name], helps to manage finances of client.    Follow Up Plan: LCSW will call client in next 3 weeks to assess housing needs of client at that time  Norva Riffle.Ashwath Lasch MSW, LCSW Licensed Clinical Social Worker [facility] [phone]

## 2018-11-20 ENCOUNTER — Ambulatory Visit: Payer: Self-pay | Admitting: Licensed Clinical Social Worker

## 2018-11-20 DIAGNOSIS — M810 Age-related osteoporosis without current pathological fracture: Secondary | ICD-10-CM

## 2018-11-20 DIAGNOSIS — N183 Chronic kidney disease, stage 3 unspecified: Secondary | ICD-10-CM

## 2018-11-20 DIAGNOSIS — Z9181 History of falling: Secondary | ICD-10-CM

## 2018-11-20 DIAGNOSIS — E785 Hyperlipidemia, unspecified: Secondary | ICD-10-CM

## 2018-11-20 DIAGNOSIS — R413 Other amnesia: Secondary | ICD-10-CM

## 2018-11-20 NOTE — Chronic Care Management (AMB) (Signed)
  Care Management Note   Jamie [name] is a 68 y.o. year old female who is a primary care patient of Terald Sleeper, PA-C. The CM team was consulted for assistance with chronic disease management and care coordination.   I reached out to [place] by phone today.    Review of patient status, including review of consultants reports, relevant laboratory and other test results, and collaboration with appropriate care team members and the patient's provider was performed as part of comprehensive patient evaluation and provision of chronic care management services.   Social Determinants of Health: risk for Depression; risk for tobacco use    Chronic Care Management from 07/17/2018 in [place]  PHQ-9 Total Score  4     Goals        . Client said she wants help in improving her living situation and wants to look for other housing options in area. (pt-stated)     Current Barriers:  . Financial constraints . Limited social support  Clinical Social Work Clinical Goal(s):  Marland Kitchen Over next 30 days, client will work with LCSW to address concerns related to housing issues of client and discuss housing options for client in the area.   Interventions: . Encouraged client/son  to call LCSW to discuss psychosocial needs of client . Previously encouraged client to talk with son and friends about paying some of the household bills.  . Previously encouraged client to use relaxation techniques of choice to help her manage stress issues . Previously talked with client about self care activities of choice . Talked with son of client about client mobility issues and insurance number to call to inquire about patient insurance benefits  Patient Self Care Activities:  . Attends all scheduled provider appointments  . Performs ADL's independently . Contacts [name], caregiver to discuss client needs  Plan:  . Patient will contact LCSW to discuss psychosical  needs of client   . LCSW will call client in three weeks to assess client housing needs . Patient will communicate with RN CM to discuss nursing needs of client . Client to call 911 for emergency assistance as needed.  . Client to attend scheduled client medical appointments  Initial goal documentation    Clienthas prescribed medications and is taking medications as prescribed. Client saidshe uses a cane or walker as needed to ambulate. She said her son and his girlfriend help procure food for client. A friend is currently preparing meals for client as needed. Client has some support from friend [name]. Client wants to continue residing at current location. She did not mention any safety issues. She likes to visit with family or friends to help her relax. She said she has some difficulty sleeping occasionally. She said her friend, [name], helps to manage finances of client. Jamie [name], son of client, and LCSW spoke of client mobility issues and her use of a cane or walker to ambulate. LCSW encouraged Jamie [name] and client to call [facility] member benefits number to inquire about member insurance benefits that could help client at this time. LCSW encouraged client/son Jamie [name] to call RNCM to discuss nursing needs of client  Follow Up Plan: LCSW to call client/son Jamie [name], in next 3 weeks to assess client housing needs and to assess social work needs of client  Norva Riffle.Sasuke Yaffe MSW, LCSW Licensed Clinical Social Worker [facility] [phone]

## 2018-11-20 NOTE — Patient Instructions (Addendum)
Licensed Clinical Social Worker Visit Information  Goals we discussed today:  Goals        . Client said she wants help in improving her living situation and wants to look for other housing options in area. (pt-stated)     v  Current Barriers:   Financial constraints  Limited social support  Clinical Social Work Clinical Goal(s):   Over next 30 days, client will work with LCSW to address concerns related to housing issues of client and discuss housing options for client in the area.   Interventions:  Encouraged client/son  to call LCSW to discuss psychosocial needs of client  Previously encouraged client to talk with son and friends about paying some of the household bills.   Previously encouraged client to use relaxation techniques of choice to help her manage stress issues  Previously talked with client about self care activities of choice  Talked with son of client about client mobility issues and insurance number to call to inquire about patient insurance benefits  Patient Self Care Activities:   Attends all scheduled provider appointments   Performs ADL's independently  Contacts [name], caregiver to discuss client needs  Plan:   Patient will contact LCSW to discuss psychosical needs of client    LCSW will call client in three weeks to assess client housing needs  Patient will communicate with RN CM to discuss nursing needs of client  Client to call 911 for emergency assistance as needed.   Client to attend scheduled client medical appointments    Materials Provided: No  Follow Up Plan: LCSW to call client or son of client in next 3 week to assess housing needs of client and assess social work needs of client  The patient Jamie [name] verbalized understanding of instructions provided today and declined a print copy of patient instruction materials.   Norva Riffle.Kathe Wirick MSW, LCSW Licensed Clinical Social Worker [facility] [phone]
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
## 2018-11-23 ENCOUNTER — Other Ambulatory Visit: Payer: Self-pay

## 2018-11-23 ENCOUNTER — Ambulatory Visit (INDEPENDENT_AMBULATORY_CARE_PROVIDER_SITE_OTHER): Payer: Medicare Other | Admitting: Ophthalmology

## 2018-11-23 ENCOUNTER — Encounter (INDEPENDENT_AMBULATORY_CARE_PROVIDER_SITE_OTHER): Payer: Self-pay | Admitting: Ophthalmology

## 2018-11-23 DIAGNOSIS — H35033 Hypertensive retinopathy, bilateral: Secondary | ICD-10-CM | POA: Diagnosis not present

## 2018-11-23 DIAGNOSIS — I10 Essential (primary) hypertension: Secondary | ICD-10-CM

## 2018-11-23 DIAGNOSIS — Z961 Presence of intraocular lens: Secondary | ICD-10-CM

## 2018-11-23 DIAGNOSIS — H33321 Round hole, right eye: Secondary | ICD-10-CM

## 2018-11-23 DIAGNOSIS — E113313 Type 2 diabetes mellitus with moderate nonproliferative diabetic retinopathy with macular edema, bilateral: Secondary | ICD-10-CM

## 2018-11-23 DIAGNOSIS — H3581 Retinal edema: Secondary | ICD-10-CM | POA: Diagnosis not present

## 2018-11-23 MED ORDER — BEVACIZUMAB CHEMO INJECTION 1.25MG/0.05ML SYRINGE FOR KALEIDOSCOPE
1.2500 mg | INTRAVITREAL | Status: AC | PRN
  Administered 2018-11-23: 1.25 mg via INTRAVITREAL

## 2018-12-06 ENCOUNTER — Other Ambulatory Visit: Payer: Self-pay | Admitting: Physician Assistant

## 2018-12-06 DIAGNOSIS — R413 Other amnesia: Secondary | ICD-10-CM

## 2018-12-06 NOTE — Progress Notes (Signed)
[facility] Note  12/08/2018     CHIEF COMPLAINT Patient presents for Retina Follow Up   HISTORY OF PRESENT ILLNESS: Jamie [name] is a 67 y.o. female who presents to the clinic today for:   HPI    Retina Follow Up    Patient presents with  Diabetic Retinopathy.  In both eyes.  This started 2 weeks ago.  Severity is moderate.  Duration of 2 weeks.  Since onset it is gradually improving.  I, the attending physician,  performed the HPI with the patient and updated documentation appropriately.          Comments    68 y/o female pt here for 2 wk f/u for NPDR OU.  Feels VA OU may be a little worse since last visit.  Denies pain, flashes, floaters.  No gtts.  BS and A1C unknown.       Last edited by Bernarda Caffey, MD on 12/08/2018  3:13 PM. (History)    pt states she feel like her vision is doing better since last visit  Referring physician: Terald Sleeper, PA-C [place],  [place] [id number]  HISTORICAL INFORMATION:   Selected notes from the MEDICAL RECORD NUMBER Referred by Dr. Melina Schools for concern of retinal heme  LEE: 02.17.20 Ricci Barker) [BCVA: OD: 20/25- OS: 20/80+]  Ocular Hx-CSME, BRVO, macular cyst, optic neuropathy, DES, pseudo OU, s/p IVA (7 total, 2008, Dr. Zadie Rhine), s/p focal laser (2008), s/p YAG (2011) PMH-brain tumor, cancer, HLD, lung cancer   CURRENT MEDICATIONS: Current Outpatient Medications (Ophthalmic Drugs)  Medication Sig  . prednisoLONE acetate (PRED FORTE) 1 % ophthalmic suspension Place 1 drop into the right eye 4 (four) times daily for 7 days.   No current facility-administered medications for this visit.  (Ophthalmic Drugs)   Current Outpatient Medications (Other)  Medication Sig  . cetirizine (ZYRTEC) 10 MG tablet Take 1 tablet (10 mg total) by mouth daily.  Marland Kitchen donepezil (ARICEPT) 5 MG tablet Take 1 tablet (5 mg total) by mouth at bedtime.  . folic acid (FOLVITE) 1 MG tablet Take 1 tablet (1 mg total) by mouth  daily.  . Ibuprofen-diphenhydrAMINE Cit (ADVIL PM PO) Take by mouth at bedtime.  . Multiple Vitamin (MULTIVITAMIN) tablet Take 1 tablet by mouth daily.  . rosuvastatin (CRESTOR) 10 MG tablet TAKE ONE TABLET AT BEDTIME   No current facility-administered medications for this visit.  (Other)      REVIEW OF SYSTEMS: ROS    Positive for: Gastrointestinal, Neurological, Musculoskeletal, Endocrine, Eyes, Psychiatric   Negative for: Constitutional, Skin, Genitourinary, HENT, Cardiovascular, Respiratory, Allergic/Imm, Heme/Lymph   Last edited by Matthew Folks, COA on 12/08/2018  2:20 PM. (History)       ALLERGIES Allergies  Allergen Reactions  . Fosamax [Alendronate Sodium] Other (See Comments)    "unknown"  . Sulfonamide Derivatives     REACTION: Hives, tongue swells    PAST MEDICAL HISTORY Past Medical History:  Diagnosis Date  . Brain cancer ([place])   . Cancer ([place])   . CKD (chronic kidney disease), stage III ([place])   . Dementia ([place])   . Diabetic retinopathy ([place])    NPDR OU  . High cholesterol   . Hypertensive retinopathy    OU  . Insomnia   . Lung cancer ([place])   . Osteoporosis    Past Surgical History:  Procedure Laterality Date  . ABDOMINAL HYSTERECTOMY  1986  . BRAIN SURGERY  2001  TUMOR REMOVAL  . catarac Bilateral   . CATARACT EXTRACTION Bilateral   . EYE SURGERY    . LOBECTOMY      FAMILY HISTORY Family History  Problem Relation Age of Onset  . Cancer Mother        cirrhois of liver  . Heart attack Father   . Colon cancer Father   . Asthma Brother     SOCIAL HISTORY Social History   Tobacco Use  . Smoking status: Former Smoker    Years: 33.00    Types: Cigarettes    Quit date: 04/20/1999    Years since quitting: 19.6  . Smokeless tobacco: Never Used  Substance Use Topics  . Alcohol use: Yes    Alcohol/week: 3.0 standard drinks    Types: 3 Cans of beer per week    Comment: occassional Saturday night 3 dark beer.  . Drug use: No          OPHTHALMIC EXAM:  Base Eye Exam    Visual Acuity (Snellen - Linear)      Right Left   Dist [place] 20/30 20/150   Dist ph [place] 20/25 20/100       Tonometry (Tonopen, 2:24 PM)      Right Left   Pressure 18 18       Pupils      Dark Light Shape React APD   Right 3 2 Round Brisk None   Left 3 2 Round Brisk None       Visual Fields (Counting fingers)      Left Right    Full Full       Extraocular Movement      Right Left    Full, Ortho Full, Ortho       Neuro/Psych    Oriented x3: Yes   Mood/Affect: Normal       Dilation    Both eyes: 1.0% Mydriacyl, 2.5% Phenylephrine @ 2:24 PM        Slit Lamp and Fundus Exam    Slit Lamp Exam      Right Left   Lids/Lashes Dermatochalasis - lower lid, Meibomian gland dysfunction, Telangiectasia Dermatochalasis - lower lid, Meibomian gland dysfunction, Telangiectasia   Conjunctiva/Sclera White and quiet White and quiet   Cornea Arcus, 1-2+ Punctate epithelial erosions Arcus, Well healed cataract wounds, mild tear film debris, trace PEE   Anterior Chamber Deep and quiet Deep and quiet   Iris Round and dilated, No NVI Round and dilated, No NVI   Lens PC IOL in good position with open PC PC IOL in good position with open PC   Vitreous Vitreous syneresis Vitreous syneresis, Posterior vitreous detachment, vitreous debris       Fundus Exam      Right Left   Disc trace Pallor, Sharp rim 1-2+ Pallor, Sharp rim   C/D Ratio 0.35 0.5   Macula Flat, Blunted foveal reflex, central Cystic changes, Microaneurysms with perifoveal cluster temporal to fovea Blunted foveal reflex, central edema, scattered Microaneurysms, focal Exudates inferior macula, RPE mottling and clumping   Vessels Vascular attenuation, mild Tortuousity Vascular attenuation, mild Tortuousity   Periphery Attached, scattered IRH, operculated hole with pigment surrounding, no SRF at 0800 Attached, rare MA          IMAGING AND PROCEDURES  Imaging and Procedures for  @TODAY @  OCT, Retina - OU - Both Eyes       Right Eye Quality was good. Central Foveal Thickness: 348. Progression has improved. Findings include abnormal  foveal contour, no SRF, intraretinal fluid, intraretinal hyper-reflective material (Interval improvement in foveal profile and IRF).   Left Eye Quality was good. Central Foveal Thickness: 447. Progression has improved. Findings include abnormal foveal contour, intraretinal fluid, no SRF, intraretinal hyper-reflective material, outer retinal atrophy (Interval improvement in foveal profile and IRF).   Notes *Images captured and stored on drive  Diagnosis / Impression:  OD: central DME; Interval improvement in foveal profile and IRF OS: central DME; Interval improvement in foveal profile and IRF  Clinical management:  See below  Abbreviations: NFP - Normal foveal profile. CME - cystoid macular edema. PED - pigment epithelial detachment. IRF - intraretinal fluid. SRF - subretinal fluid. EZ - ellipsoid zone. ERM - epiretinal membrane. ORA - outer retinal atrophy. ORT - outer retinal tubulation. SRHM - subretinal hyper-reflective material        Fluorescein Angiography Optos (Transit OS)       Right Eye   Progression has no prior data. Early phase findings include vascular perfusion defect, microaneurysm. Mid/Late phase findings include vascular perfusion defect, leakage, microaneurysm (Late hyperfluorescent staining of disc).   Left Eye   Progression has no prior data. Early phase findings include microaneurysm, vascular perfusion defect. Mid/Late phase findings include leakage, microaneurysm, vascular perfusion defect (Late hyperfluorescent staining of disc).   Notes Images stored on drive;   Impression: Moderate NPDR OU No NV OU Mild scattered capillary non-perfusion OU OS with enlarged FAZ         Focal Laser - OD - Right Eye       LASER PROCEDURE NOTE  Diagnosis:   Diabetic macular edema, right  eye  Procedure:  Focal laser photocoagulation using slit lamp laser, right eye  Anesthesia:  Topical  Surgeon: Bernarda Caffey, MD, PhD   Informed consent obtained, operative eye marked, and time out performed prior to initiation of laser.   Lumenis [id number] Focal/Grid laser Power: 90 mW Duration: 50 msec  Spot size: 100 microns  # spots: 33 spots placed to MAs superior and temporal to fovea  Complications: None.  RTC: 2 wks -- DFE OCT possible IVA  Patient tolerated the procedure well and received written and verbal post-procedure care information/education.                   ASSESSMENT/PLAN:    ICD-10-CM   1. Moderate nonproliferative diabetic retinopathy of both eyes with macular edema associated with type 2 diabetes mellitus (HCC)  O67.1245 Fluorescein Angiography Optos (Transit OS)    Focal Laser - OD - Right Eye  2. Retinal edema  H35.81 OCT, Retina - OU - Both Eyes  3. Retinal hole of right eye  H33.321   4. Essential hypertension  I10   5. Hypertensive retinopathy of both eyes  H35.033 Fluorescein Angiography Optos (Transit OS)  6. Pseudophakia of both eyes  Z96.1     1,2. Moderate Non-proliferative diabetic retinopathy w/ DME, OU (OS>OD)  - previously a pt of Dr. Zadie Rhine -- per Dr. Rona Ravens note, s/p multiple IVA OS (x7), s/p focal laser OS  - pt has never been formally diagnosed with DM -- exam very suggestive of DM  - recommend work up / A1c check by PCP  - exam shows scattered [place]; no obvious NV  - IVA #1 OU (08.06.20)  - OCT shows diabetic macular edema, OU (OS > OD) -- today w/ improved IRF OU   - FA shows late leaking MA OU, no NV OU; OD with significant temporal MA as  source of DME  - recommend focal laser OD today, 08.21.20  - pt wishes to proceed  - RBA of procedure discussed, questions answered  - informed consent obtained and signed  - see procedure note  - f/u 2 weeks, DFE, OCT, possible injection(s)  3. Operculated hole OD  - located at  0800, with surrounding pigment, no SRF  - appears chronic and stable  - will monitor for now, but will likely recommend laser retinopexy OD to reinforce and prophylax  4,5 Hypertensive retinopathy OU  - discussed importance of tight BP control and its potential contribution to macular edema  - monitor  6. Pseudophakia OU  - s/p CE/IOL OU -- unknown cataract surgeon  - beautiful surgeries, doing well  - monitor   Ophthalmic Meds Ordered this visit:  Meds ordered this encounter  Medications  . prednisoLONE acetate (PRED FORTE) 1 % ophthalmic suspension    Sig: Place 1 drop into the right eye 4 (four) times daily for 7 days.    Dispense:  10 mL    Refill:  0       Return in about 2 weeks (around 12/22/2018) for f/u NPDR OU, DFE, OCT.  There are no Patient Instructions on file for this visit.   Explained the diagnoses, plan, and follow up with the patient and they expressed understanding.  Patient expressed understanding of the importance of proper follow up care.   This document serves as a record of services personally performed by Gardiner Sleeper, MD, PhD. It was created on their behalf by Roselee Nova, COMT. The creation of this record is the provider's dictation and/or activities during the visit.  Electronically signed by: Roselee Nova, COMT 12/08/18 3:54 PM   Gardiner Sleeper, M.D., Ph.D. Diseases & Surgery of the Retina and Vitreous [facility]  I have reviewed the above documentation for accuracy and completeness, and I agree with the above. Gardiner Sleeper, M.D., Ph.D. 12/08/18 3:54 PM    Abbreviations: M myopia (nearsighted); A astigmatism; H hyperopia (farsighted); P presbyopia; Mrx spectacle prescription;  CTL contact lenses; OD right eye; OS left eye; OU both eyes  XT exotropia; ET esotropia; PEK punctate epithelial keratitis; PEE punctate epithelial erosions; DES dry eye syndrome; MGD meibomian gland dysfunction; ATs artificial tears;  PFAT's preservative free artificial tears; Petrey nuclear sclerotic cataract; PSC posterior subcapsular cataract; ERM epi-retinal membrane; PVD posterior vitreous detachment; RD retinal detachment; DM diabetes mellitus; DR diabetic retinopathy; NPDR non-proliferative diabetic retinopathy; PDR proliferative diabetic retinopathy; CSME clinically significant macular edema; DME diabetic macular edema; dbh dot blot hemorrhages; CWS cotton wool spot; POAG primary open angle glaucoma; C/D cup-to-disc ratio; HVF humphrey visual field; GVF goldmann visual field; OCT optical coherence tomography; IOP intraocular pressure; BRVO Branch retinal vein occlusion; CRVO central retinal vein occlusion; CRAO central retinal artery occlusion; BRAO branch retinal artery occlusion; RT retinal tear; SB scleral buckle; PPV pars plana vitrectomy; VH Vitreous hemorrhage; PRP panretinal laser photocoagulation; IVK intravitreal kenalog; VMT vitreomacular traction; MH Macular hole;  NVD neovascularization of the disc; NVE neovascularization elsewhere; AREDS age related eye disease study; ARMD age related macular degeneration; POAG primary open angle glaucoma; EBMD epithelial/anterior basement membrane dystrophy; ACIOL anterior chamber intraocular lens; IOL intraocular lens; PCIOL posterior chamber intraocular lens; Phaco/IOL phacoemulsification with intraocular lens placement; [place] photorefractive keratectomy; LASIK laser assisted in situ keratomileusis; HTN hypertension; DM diabetes mellitus; COPD chronic obstructive pulmonary disease

## 2018-12-06 NOTE — Telephone Encounter (Signed)
Pt will call pharmacy to check on refill, was not at his mom's house at the time to know name of medication. This was refilled on 09/06/18 #90 with 3 refills

## 2018-12-08 ENCOUNTER — Ambulatory Visit (INDEPENDENT_AMBULATORY_CARE_PROVIDER_SITE_OTHER): Payer: Medicare Other | Admitting: Ophthalmology

## 2018-12-08 ENCOUNTER — Encounter (INDEPENDENT_AMBULATORY_CARE_PROVIDER_SITE_OTHER): Payer: Self-pay | Admitting: Ophthalmology

## 2018-12-08 ENCOUNTER — Other Ambulatory Visit: Payer: Self-pay

## 2018-12-08 DIAGNOSIS — Z961 Presence of intraocular lens: Secondary | ICD-10-CM

## 2018-12-08 DIAGNOSIS — H35033 Hypertensive retinopathy, bilateral: Secondary | ICD-10-CM | POA: Diagnosis not present

## 2018-12-08 DIAGNOSIS — H33321 Round hole, right eye: Secondary | ICD-10-CM | POA: Diagnosis not present

## 2018-12-08 DIAGNOSIS — E113313 Type 2 diabetes mellitus with moderate nonproliferative diabetic retinopathy with macular edema, bilateral: Secondary | ICD-10-CM

## 2018-12-08 DIAGNOSIS — H3581 Retinal edema: Secondary | ICD-10-CM

## 2018-12-08 DIAGNOSIS — I10 Essential (primary) hypertension: Secondary | ICD-10-CM

## 2018-12-08 MED ORDER — PREDNISOLONE ACETATE 1 % OP SUSP: 1.0000 [drp] | Freq: Four times a day (QID) | OPHTHALMIC | 0 refills | Status: AC

## 2018-12-11 ENCOUNTER — Ambulatory Visit (INDEPENDENT_AMBULATORY_CARE_PROVIDER_SITE_OTHER): Payer: Medicare Other | Admitting: Licensed Clinical Social Worker

## 2018-12-11 ENCOUNTER — Ambulatory Visit (INDEPENDENT_AMBULATORY_CARE_PROVIDER_SITE_OTHER): Payer: Medicare Other | Admitting: Physician Assistant

## 2018-12-11 ENCOUNTER — Encounter: Payer: Self-pay | Admitting: Physician Assistant

## 2018-12-11 DIAGNOSIS — N183 Chronic kidney disease, stage 3 unspecified: Secondary | ICD-10-CM

## 2018-12-11 DIAGNOSIS — E785 Hyperlipidemia, unspecified: Secondary | ICD-10-CM

## 2018-12-11 DIAGNOSIS — R413 Other amnesia: Secondary | ICD-10-CM

## 2018-12-11 DIAGNOSIS — Z9181 History of falling: Secondary | ICD-10-CM

## 2018-12-11 DIAGNOSIS — E7800 Pure hypercholesterolemia, unspecified: Secondary | ICD-10-CM

## 2018-12-11 DIAGNOSIS — M810 Age-related osteoporosis without current pathological fracture: Secondary | ICD-10-CM | POA: Diagnosis not present

## 2018-12-11 DIAGNOSIS — E538 Deficiency of other specified B group vitamins: Secondary | ICD-10-CM | POA: Diagnosis not present

## 2018-12-11 MED ORDER — ROSUVASTATIN CALCIUM 10 MG PO TABS: 10.0000 mg | ORAL_TABLET | Freq: Every day | ORAL | 3 refills | Status: DC

## 2018-12-11 NOTE — Patient Instructions (Signed)
Licensed Clinical Social Worker Visit Information  Goals we discussed today:  Goals        . Client said she wants help in improving her living situation and wants to look for other housing options in area. (pt-stated)     Current Barriers:  . Financial constraints . Limited social support  Clinical Social Work Clinical Goal(s):  Marland Kitchen Over next 30 days, client will work with LCSW to address concerns related to housing issues of client and discuss housing options for client in the area.   Interventions: . Encouraged client to call LCSW to discuss psychosocial needs of client . Talked with client about client safety status . Encouraged client to talk with son and friends about paying some of the household bills.  . Encouraged client to use relaxation techniques of choice to help her manage stress issues . Talked with client about financial management of client's finances  Patient Self Care Activities:  . Attends all scheduled provider appointments  . Performs ADL's independently . Contacts [name], caregiver to discuss client needs  Plan:  . Patient will contact LCSW to discuss psychosical needs of client   . LCSW will call client in three weeks to assess client housing needs . Patient will communicate with RN CM to discuss nursing needs of client . Client to call 911 for emergency assistance as needed.  . Client to attend scheduled client medical appointments  Initial goal documentation            Materials Provided: No  Follow Up Plan: LCSW will call client in next 3 weeks to assess client housing needs and to assess the social work needs of client at that time  The patient verbalized understanding of instructions provided today and declined a print copy of patient instruction materials.   Norva Riffle.Davon Folta MSW, LCSW Licensed Clinical Social Worker [facility] [phone]

## 2018-12-11 NOTE — Progress Notes (Signed)
Telephone visit  Subjective: CC: Chronic conditions PCP: Terald Sleeper, PA-C [id number] Jamie [name] is a 68 y.o. female calls for telephone consult today. Patient provides verbal consent for consult held via phone.  Patient is identified with 2 separate identifiers.  At this time the entire area is on COVID-19 social distancing and stay home orders are in place.  Patient is of higher risk and therefore we are performing this by a virtual method.  Location of patient: Home Location of provider: [facility] Others present for call: Son and daughter-in-law  This patient is having a chronic follow-up for medical conditions but she states that she is currently under the care of an ophthalmologist for recent bleed in her eye.  I do see an ophthalmic drop on her medicine list.  And she is going every 2 weeks.  I have encouraged her to continue with this to make sure her eye gets fully healed.  She states that overall she is doing very well with everything else.  She does have memory loss, allergies, folic acid deficiency, hyperlipidemia.  A lab order will be placed and she can come in the following weeks for fasting labs.  She does need some refills on her Crestor.  The remainder of her medications have refills until next May.  I have encouraged her to give [place] a call if anything else is going on her she is having any difficulties.    ROS: Per HPI  Allergies  Allergen Reactions  . Fosamax [Alendronate Sodium] Other (See Comments)    "unknown"  . Sulfonamide Derivatives     REACTION: Hives, tongue swells   Past Medical History:  Diagnosis Date  . Brain cancer ([place])   . Cancer ([place])   . CKD (chronic kidney disease), stage III ([place])   . Dementia ([place])   . Diabetic retinopathy ([place])    NPDR OU  . High cholesterol   . Hypertensive retinopathy    OU  . Insomnia   . Lung cancer ([place])   . Osteoporosis     Current Outpatient Medications:  .  cetirizine (ZYRTEC) 10 MG tablet, Take 1 tablet  (10 mg total) by mouth daily., Disp: 90 tablet, Rfl: 3 .  donepezil (ARICEPT) 5 MG tablet, Take 1 tablet (5 mg total) by mouth at bedtime., Disp: 90 tablet, Rfl: 3 .  folic acid (FOLVITE) 1 MG tablet, Take 1 tablet (1 mg total) by mouth daily., Disp: 90 tablet, Rfl: 3 .  Ibuprofen-diphenhydrAMINE Cit (ADVIL PM PO), Take by mouth at bedtime., Disp: , Rfl:  .  Multiple Vitamin (MULTIVITAMIN) tablet, Take 1 tablet by mouth daily., Disp: , Rfl:  .  prednisoLONE acetate (PRED FORTE) 1 % ophthalmic suspension, Place 1 drop into the right eye 4 (four) times daily for 7 days., Disp: 10 mL, Rfl: 0 .  rosuvastatin (CRESTOR) 10 MG tablet, Take 1 tablet (10 mg total) by mouth at bedtime., Disp: 90 tablet, Rfl: 3  Assessment/ Plan: 68 y.o. female   1. Pure hypercholesterolemia - rosuvastatin (CRESTOR) 10 MG tablet; Take 1 tablet (10 mg total) by mouth at bedtime.  Dispense: 90 tablet; Refill: 3 - Lipid panel; Future - TSH; Future  2. Memory impairment - CMP14+EGFR; Future - CBC with Differential/Platelet; Future - Lipid panel; Future - TSH; Future  3. Folate deficiency - CBC with Differential/Platelet; Future   Return in about 4 months (around 04/12/2019) for recheck medications.  Continue all other maintenance medications as listed above.  Start  time: 9:53 AM End time: 10:04 AM  Meds ordered this encounter  Medications  . rosuvastatin (CRESTOR) 10 MG tablet    Sig: Take 1 tablet (10 mg total) by mouth at bedtime.    Dispense:  90 tablet    Refill:  3    $    Order Specific Question:   Supervising Provider    Answer:   GOTTSCHALK, ASHLY M [[id number]]      PA-C [facility] [phone]  

## 2018-12-11 NOTE — Chronic Care Management (AMB) (Signed)
Care Management Note   Jamie [name] is a 68 y.o. year old female who is a primary care patient of Jamie Sleeper, PA-C. The CM team was consulted for assistance with chronic disease management and care coordination.   I reached out to Jamie [name] by phone today.   Review of patient status, including review of consultants reports, relevant laboratory and other test results, and collaboration with appropriate care team members and the patient's provider was performed as part of comprehensive patient evaluation and provision of chronic care management services.   Social Determinants of Health: risk of tobacco use; risk of depression    Chronic Care Management from 07/17/2018 in [facility]  PHQ-9 Total Score  4     Medications    cetirizine (ZYRTEC) 10 MG tablet    donepezil (ARICEPT) 5 MG tablet    folic acid (FOLVITE) 1 MG tablet    Ibuprofen-diphenhydrAMINE Cit (ADVIL PM PO)    Multiple Vitamin (MULTIVITAMIN) tablet    prednisoLONE acetate (PRED FORTE) 1 % ophthalmic suspension    rosuvastatin (CRESTOR) 10 MG tablet    Goals        . Client said she wants help in improving her living situation and wants to look for other housing options in area. (pt-stated)     Current Barriers:  . Financial constraints . Limited social support  Clinical Social Work Clinical Goal(s):  Marland Kitchen Over next 30 days, client will work with LCSW to address concerns related to housing issues of client and discuss housing options for client in the area.   Interventions: . Encouraged client to call LCSW to discuss psychosocial needs of client . Talked with client about client safety status . Encouraged client to talk with son and friends about paying some of the household bills.  . Previously encouraged client to use relaxation techniques of choice to help her manage stress issues . Talked with client about financial management of client's finances.   Patient Self Care Activities:   . Attends all scheduled provider appointments  . Performs ADL's independently . Contacts Jamie [name], caregiver to discuss client needs  Plan:  . Patient will contact LCSW to discuss psychosical needs of client   . LCSW will call client in three weeks to assess client housing needs . Patient will communicate with RN CM to discuss nursing needs of client . Client to call 911 for emergency assistance as needed.  . Client to attend scheduled client medical appointments  Initial goal documentation   Client spoke of recent appointments with opthamologist in [place], [place].  She did not mention any pain issues.  She has her prescribed medications and is taking medications as prescribed. Client said she had a phone visit with Jamie Nearing PA-C today.  She said she wants to continue residing at her current residence. She needs some assistance with ADLs. Client said that person who had previously managed client's money may have taken some money from client. Client  said that individual is no longer managing client's money for client. Client said that her son is now managing client's money for her. She said she was pleased with this arrangement for son to manage her money for her.  Client said she had a little money in savings. Client is attending client's medical appointments. She has support from her son. LCSW encouraged Jamie [name] to call RNCM as needed to discuss nursing needs of client  Follow Up Plan: LCSW will call client in next 3 weeks  to assess client housing needs and to assess client's social work needs  Jamie Riffle.Gunner [name] MSW, LCSW Licensed Clinical Social Worker [facility] [phone]

## 2018-12-20 ENCOUNTER — Ambulatory Visit (INDEPENDENT_AMBULATORY_CARE_PROVIDER_SITE_OTHER): Payer: Medicare Other | Admitting: *Deleted

## 2018-12-20 DIAGNOSIS — Z0000 Encounter for general adult medical examination without abnormal findings: Secondary | ICD-10-CM

## 2018-12-20 NOTE — Progress Notes (Signed)
MEDICARE ANNUAL WELLNESS VISIT  12/20/2018  Telephone Visit Disclaimer This Medicare AWV was conducted by telephone due to national recommendations for restrictions regarding the COVID-19 Pandemic (e.g. social distancing).  I verified, using two identifiers, that I am speaking with Jamie [name] or their authorized healthcare agent. I discussed the limitations, risks, security, and privacy concerns of performing an evaluation and management service by telephone and the potential availability of an in-person appointment in the future. The patient expressed understanding and agreed to proceed.   Subjective:  Jamie [name] is a 68 y.o. female patient of Jamie Sleeper, PA-C who had a Medicare Annual Wellness Visit today via telephone. Her daughter in law was with her today on the phone to help answer questions. Jamie [name] is Retired and lives with their family. she has 2 children. she reports that she is socially active and does interact with friends/family regularly. she is minimally physically active and enjoys watching TV shows.  Patient Care Team: Theodoro Clock as PCP - General (Physician Assistant) Gatha Mayer, MD as Consulting Physician (Gastroenterology) Lavonna Monarch, MD as Consulting Physician (Dermatology) Shea Evans, Norva Riffle, LCSW as Social Worker (Licensed Clinical Social Worker) Ilean China, RN as Case Manager  Advanced Directives 12/20/2018 01/23/2018 01/22/2018 10/30/2017 10/22/2017 01/24/2016 09/20/2014  Does Patient Have a Medical Advance Directive? No No No No No No Yes  Type of Advance Directive - - - - - - Press photographer;Living will  Does patient want to make changes to medical advance directive? - - - - - - Yes - information given  Copy of [place] in Chart? - - - - - - No - copy requested  Would patient like information on creating a medical advance directive? No - Patient declined - No - Patient declined No - Patient declined No -  Patient declined - -  Pre-existing out of facility DNR order (yellow form or pink MOST form) - - - - - - -    Hospital Utilization Over the Past 12 Months: # of hospitalizations or ER visits: 1 # of surgeries: 0  Review of Systems    Patient reports that her overall health is worse due to her increased memory loss r/t dementia compared to last year.  History obtained from chart review  Patient Reported Readings (BP, Pulse, CBG, Weight, etc) none  Pain Assessment Pain : No/denies pain     Current Medications & Allergies (verified) Allergies as of 12/20/2018      Reactions   Fosamax [alendronate Sodium] Other (See Comments)   "unknown"   Sulfonamide Derivatives    REACTION: Hives, tongue swells      Medication List       Accurate as of December 20, 2018  2:41 PM. If you have any questions, ask your nurse or doctor.        STOP taking these medications   cetirizine 10 MG tablet Commonly known as: ZYRTEC     TAKE these medications   ADVIL PM PO Take by mouth at bedtime.   donepezil 5 MG tablet Commonly known as: ARICEPT Take 1 tablet (5 mg total) by mouth at bedtime.   folic acid 1 MG tablet Commonly known as: FOLVITE Take 1 tablet (1 mg total) by mouth daily.   multivitamin tablet Take 1 tablet by mouth daily.   rosuvastatin 10 MG tablet Commonly known as: CRESTOR Take 1 tablet (10 mg total) by mouth at bedtime.  History (reviewed): Past Medical History:  Diagnosis Date  . Brain cancer ([place])   . Cancer ([place])   . CKD (chronic kidney disease), stage III ([place])   . Dementia ([place])   . Diabetic retinopathy ([place])    NPDR OU  . High cholesterol   . Hypertensive retinopathy    OU  . Insomnia   . Lung cancer ([place])   . Osteoporosis    Past Surgical History:  Procedure Laterality Date  . ABDOMINAL HYSTERECTOMY  1986  . BRAIN SURGERY  2001   TUMOR REMOVAL  . catarac Bilateral   . CATARACT EXTRACTION Bilateral   . EYE SURGERY    . LOBECTOMY      Family History  Problem Relation Age of Onset  . Cancer Mother        cirrhois of liver  . Heart attack Father   . Colon cancer Father   . Asthma Brother    Social History   Socioeconomic History  . Marital status: Divorced    Spouse name: Not on file  . Number of children: 2  . Years of education: 9  . Highest education level: 9th grade  Occupational History  . Occupation: retired  Scientific laboratory technician  . Financial resource strain: Not hard at all  . Food insecurity    Worry: Never true    Inability: Never true  . Transportation needs    Medical: No    Non-medical: No  Tobacco Use  . Smoking status: Former Smoker    Years: 33.00    Types: Cigarettes    Quit date: 04/20/1999    Years since quitting: 19.6  . Smokeless tobacco: Never Used  Substance and Sexual Activity  . Alcohol use: Not Currently  . Drug use: No  . Sexual activity: Not Currently  Lifestyle  . Physical activity    Days per week: 0 days    Minutes per session: 0 min  . Stress: Patient refused  Relationships  . Social connections    Talks on phone: More than three times a week    Gets together: More than three times a week    Attends religious service: Never    Active member of club or organization: No    Attends meetings of clubs or organizations: Never    Relationship status: Divorced  Other Topics Concern  . Not on file  Social History Narrative  . Not on file    Activities of Daily Living In your present state of health, do you have any difficulty performing the following activities: 12/20/2018 07/24/2018  Hearing? Y N  Comment hard of hearing -  Vision? Y N  Comment she uses a magnifying glass to dial on the phone and has glasses but doesn't wear them often -  Difficulty concentrating or making decisions? Y Y  Comment due to dementia -  Walking or climbing stairs? Y N  Comment her family always helps her with steps -  Dressing or bathing? N N  Doing errands, shopping? Y N  Comment her family  takes her to all appointments and does all of her errands -  Conservation officer, nature and eating ? Y -  Comment her family prepares all of her meals for her -  Using the Toilet? N -  In the past six months, have you accidently leaked urine? Y -  Comment occasionally, does not have to wear pad or depend -  Do you have problems with loss of bowel control? N -  Managing  your Medications? Y -  Comment family takes care of her medications -  Managing your Finances? Y -  Comment son manages her finances -  Housekeeping or managing your Housekeeping? Y -  Comment her family does all the housework -  Some recent data might be hidden    Patient Education/ Literacy How often do you need to have someone help you when you read instructions, pamphlets, or other written materials from your doctor or pharmacy?: 5 - Always(she can read a little bit-but due to her dementia someone usually has to read for her) What is the last grade level you completed in school?: 9th grade  Exercise Current Exercise Habits: The patient does not participate in regular exercise at present, Exercise limited by: neurologic condition(s)  Diet Patient reports consuming 3 meals a day and 2 snack(s) a day Patient reports that her primary diet is: Regular Patient reports that she does have regular access to food.   Depression Screen PHQ 2/9 Scores 12/20/2018 07/17/2018 06/05/2018 01/10/2018 10/24/2017 02/16/2017 08/03/2016  PHQ - 2 Score - 2 2 2 3 5 2   PHQ- 9 Score - 4 4 3 13 12 10   Exception Documentation Medical reason - - - - - -     Fall Risk Fall Risk  12/20/2018 06/05/2018 01/10/2018 10/24/2017 02/16/2017  Falls in the past year? 1 1 Yes Yes Yes  Comment - - - - -  Number falls in past yr: 1 1 2  or more 2 or more 2 or more  Injury with Fall? 0 1 Yes Yes Yes  Risk Factor Category  - - High Fall Risk High Fall Risk High Fall Risk  Risk for fall due to : History of fall(s);Impaired balance/gait;Impaired vision;Mental status change - - -  -  Risk for fall due to: Comment - - - - -  Follow up Falls prevention discussed - - - Falls prevention discussed  Comment get rid of throw rugs in the house, adequate lighting in walkways and grab bars in bathroom - - - -     Objective:  Jamie [name] seemed alert and oriented and she participated appropriately during our telephone visit.  Blood Pressure Weight BMI  BP Readings from Last 3 Encounters:  06/05/18 130/76  01/25/18 105/85  01/10/18 134/88   Wt Readings from Last 3 Encounters:  06/05/18 171 lb 3.2 oz (77.7 kg)  01/25/18 175 lb 0.7 oz (79.4 kg)  01/10/18 169 lb 6.4 oz (76.8 kg)   BMI Readings from Last 1 Encounters:  06/05/18 26.81 kg/m    *Unable to obtain current vital signs, weight, and BMI due to telephone visit type  Hearing/Vision  . Samiyyah did not seem to have difficulty with hearing/understanding during the telephone conversation . Reports that she has had a formal eye exam by an eye care professional within the past year . Reports that she has not had a formal hearing evaluation within the past year *Unable to fully assess hearing and vision during telephone visit type  Cognitive Function: No flowsheet data found. (Normal:0-7, Significant for Dysfunction: >8)  Normal Cognitive Function Screening: No: pt unable to complete MMSE/6CIT due to Dementia   Immunization & Health Maintenance Record Immunization History  Administered Date(s) Administered  . Hpv 11/16/2006  . Tdap 09/13/2012  . Zoster 12/05/2012    Health Maintenance  Topic Date Due  . Hepatitis C Screening  10/23/1950  . URINE MICROALBUMIN  10/04/1962  . PNA vac Low Risk Adult (1 of 2 -  PCV13) 10/03/2017  . MAMMOGRAM  12/15/2017  . COLONOSCOPY  07/19/2018  . INFLUENZA VACCINE  11/18/2018  . TETANUS/TDAP  09/14/2022  . DEXA SCAN  Completed       Assessment  This is a routine wellness examination for ANAIZA BEHRENS.  Health Maintenance: Due or Overdue Health Maintenance Due  Topic  Date Due  . Hepatitis C Screening  1950/12/03  . URINE MICROALBUMIN  10/04/1962  . PNA vac Low Risk Adult (1 of 2 - PCV13) 10/03/2017  . MAMMOGRAM  12/15/2017  . COLONOSCOPY  07/19/2018  . INFLUENZA VACCINE  11/18/2018    Jamie [name] does not need a referral for Community Assistance: Care Management:   no Social Work:    no Prescription Assistance:  no Nutrition/Diabetes Education:  no   Plan:  Personalized Goals Goals Addressed            This Visit's Progress   . DIET - INCREASE WATER INTAKE       Try to drink 6-8 glasses of water daily.      Personalized Health Maintenance & Screening Recommendations  Pneumococcal vaccine  Influenza vaccine Declines Mammogram and Colonoscopy  Lung Cancer Screening Recommended: no (Low Dose CT Chest recommended if Age 39-80 years, 30 pack-year currently smoking OR have quit w/in past 15 years) Hepatitis C Screening recommended: yes HIV Screening recommended: no  Advanced Directives: Written information was not prepared per patient's request.  Referrals & Orders No orders of the defined types were placed in this encounter.   Follow-up Plan . Follow-up with Jamie Sleeper, PA-C as planned . Consider Prevnar and Flu vaccines at your next visit with your PCP   I have personally reviewed and noted the following in the patient's chart:   . Medical and social history . Use of alcohol, tobacco or illicit drugs  . Current medications and supplements . Functional ability and status . Nutritional status . Physical activity . Advanced directives . List of other physicians . Hospitalizations, surgeries, and ER visits in previous 12 months . Vitals . Screenings to include cognitive, depression, and falls . Referrals and appointments  In addition, I have reviewed and discussed with Jamie [name] certain preventive protocols, quality metrics, and best practice recommendations. A written personalized care plan for preventive services  as well as general preventive health recommendations is available and can be mailed to the patient at her request.      Marylin Crosby, LPN  11/25/9167

## 2018-12-20 NOTE — Patient Instructions (Signed)
Preventive Care 38 Years and Older, Female Preventive care refers to lifestyle choices and visits with your health care provider that can promote health and wellness. This includes:  A yearly physical exam. This is also called an annual well check.  Regular dental and eye exams.  Immunizations.  Screening for certain conditions.  Healthy lifestyle choices, such as diet and exercise. What can I expect for my preventive care visit? Physical exam Your health care provider will check:  Height and weight. These may be used to calculate body mass index (BMI), which is a measurement that tells if you are at a healthy weight.  Heart rate and blood pressure.  Your skin for abnormal spots. Counseling Your health care provider may ask you questions about:  Alcohol, tobacco, and drug use.  Emotional well-being.  Home and relationship well-being.  Sexual activity.  Eating habits.  History of falls.  Memory and ability to understand (cognition).  Work and work Statistician.  Pregnancy and menstrual history. What immunizations do I need?  Influenza (flu) vaccine  This is recommended every year. Tetanus, diphtheria, and pertussis (Tdap) vaccine  You may need a Td booster every 10 years. Varicella (chickenpox) vaccine  You may need this vaccine if you have not already been vaccinated. Zoster (shingles) vaccine  You may need this after age 33. Pneumococcal conjugate (PCV13) vaccine  One dose is recommended after age 33. Pneumococcal polysaccharide (PPSV23) vaccine  One dose is recommended after age 72. Measles, mumps, and rubella (MMR) vaccine  You may need at least one dose of MMR if you were born in 1957 or later. You may also need a second dose. Meningococcal conjugate (MenACWY) vaccine  You may need this if you have certain conditions. Hepatitis A vaccine  You may need this if you have certain conditions or if you travel or work in places where you may be exposed  to hepatitis A. Hepatitis B vaccine  You may need this if you have certain conditions or if you travel or work in places where you may be exposed to hepatitis B. Haemophilus influenzae type b (Hib) vaccine  You may need this if you have certain conditions. You may receive vaccines as individual doses or as more than one vaccine together in one shot (combination vaccines). Talk with your health care provider about the risks and benefits of combination vaccines. What tests do I need? Blood tests  Lipid and cholesterol levels. These may be checked every 5 years, or more frequently depending on your overall health.  Hepatitis C test.  Hepatitis B test. Screening  Lung cancer screening. You may have this screening every year starting at age 39 if you have a 30-pack-year history of smoking and currently smoke or have quit within the past 15 years.  Colorectal cancer screening. All adults should have this screening starting at age 36 and continuing until age 15. Your health care provider may recommend screening at age 23 if you are at increased risk. You will have tests every 1-10 years, depending on your results and the type of screening test.  Diabetes screening. This is done by checking your blood sugar (glucose) after you have not eaten for a while (fasting). You may have this done every 1-3 years.  Mammogram. This may be done every 1-2 years. Talk with your health care provider about how often you should have regular mammograms.  BRCA-related cancer screening. This may be done if you have a family history of breast, ovarian, tubal, or peritoneal cancers.  Other tests  Sexually transmitted disease (STD) testing.  Bone density scan. This is done to screen for osteoporosis. You may have this done starting at age 76. Follow these instructions at home: Eating and drinking  Eat a diet that includes fresh fruits and vegetables, whole grains, lean protein, and low-fat dairy products. Limit  your intake of foods with high amounts of sugar, saturated fats, and salt.  Take vitamin and mineral supplements as recommended by your health care provider.  Do not drink alcohol if your health care provider tells you not to drink.  If you drink alcohol: ? Limit how much you have to 0-1 drink a day. ? Be aware of how much alcohol is in your drink. In the [place] one drink equals one 12 oz bottle of beer (355 mL), one 5 oz glass of wine (148 mL), or one 1 oz glass of hard liquor (44 mL). Lifestyle  Take daily care of your teeth and gums.  Stay active. Exercise for at least 30 minutes on 5 or more days each week.  Do not use any products that contain nicotine or tobacco, such as cigarettes, e-cigarettes, and chewing tobacco. If you need help quitting, ask your health care provider.  If you are sexually active, practice safe sex. Use a condom or other form of protection in order to prevent STIs (sexually transmitted infections).  Talk with your health care provider about taking a low-dose aspirin or statin. What's next?  Go to your health care provider once a year for a well check visit.  Ask your health care provider how often you should have your eyes and teeth checked.  Stay up to date on all vaccines. This information is not intended to replace advice given to you by your health care provider. Make sure you discuss any questions you have with your health care provider. Document Released: 05/02/2015 Document Revised: 03/30/2018 Document Reviewed: 03/30/2018 Elsevier Patient Education  2020 [employer].

## 2018-12-22 ENCOUNTER — Encounter (INDEPENDENT_AMBULATORY_CARE_PROVIDER_SITE_OTHER): Payer: Medicare Other | Admitting: Ophthalmology

## 2018-12-27 ENCOUNTER — Telehealth: Payer: Self-pay | Admitting: Physician Assistant

## 2018-12-27 NOTE — Telephone Encounter (Signed)
No. She is CCM patient, so Cyril Mourning?

## 2018-12-27 NOTE — Telephone Encounter (Signed)
I haven't tried to call either and I can't find anything documented from any other department either. I wonder if it was an automated call from [facility].

## 2018-12-27 NOTE — Progress Notes (Signed)
[facility] Note  12/29/2018     CHIEF COMPLAINT Patient presents for Retina Follow Up   HISTORY OF PRESENT ILLNESS: Jamie [name] is a 68 y.o. female who presents to the clinic today for:   HPI    Retina Follow Up    Patient presents with  Diabetic Retinopathy.  In both eyes.  This started 2 weeks ago.  Since onset it is stable.  I, the attending physician,  performed the HPI with the patient and updated documentation appropriately.          Comments    F/U NPDR OU. Patient states she has occasional flashes OU, denies pain. Pt does not monitor BS, denies hypo/hperglycemic episodes.        Last edited by Bernarda Caffey, MD on 12/29/2018  9:29 AM. (History)    pt    Referring physician: Terald Sleeper, PA-C [place],  [place] [id number]  HISTORICAL INFORMATION:   Selected notes from the MEDICAL RECORD NUMBER Referred by Dr. Melina Schools for concern of retinal heme  LEE: 02.17.20 Ricci Barker) [BCVA: OD: 20/25- OS: 20/80+]  Ocular Hx-CSME, BRVO, macular cyst, optic neuropathy, DES, pseudo OU, s/p IVA (7 total, 2008, Dr. Zadie Rhine), s/p focal laser (2008), s/p YAG (2011) PMH-brain tumor, cancer, HLD, lung cancer   CURRENT MEDICATIONS: No current outpatient medications on file. (Ophthalmic Drugs)   No current facility-administered medications for this visit.  (Ophthalmic Drugs)   Current Outpatient Medications (Other)  Medication Sig  . donepezil (ARICEPT) 5 MG tablet Take 1 tablet (5 mg total) by mouth at bedtime.  . folic acid (FOLVITE) 1 MG tablet Take 1 tablet (1 mg total) by mouth daily.  . Ibuprofen-diphenhydrAMINE Cit (ADVIL PM PO) Take by mouth at bedtime.  . Multiple Vitamin (MULTIVITAMIN) tablet Take 1 tablet by mouth daily.  . rosuvastatin (CRESTOR) 10 MG tablet Take 1 tablet (10 mg total) by mouth at bedtime.   No current facility-administered medications for this visit.  (Other)      REVIEW OF SYSTEMS: ROS    Positive for:  Endocrine, Eyes   Negative for: Constitutional, Gastrointestinal, Neurological, Skin, Genitourinary, Musculoskeletal, HENT, Cardiovascular, Respiratory, Psychiatric, Allergic/Imm, Heme/Lymph   Last edited by Zenovia Jordan, LPN on 6/43/3295  1:88 AM. (History)       ALLERGIES Allergies  Allergen Reactions  . Fosamax [Alendronate Sodium] Other (See Comments)    "unknown"  . Sulfonamide Derivatives     REACTION: Hives, tongue swells    PAST MEDICAL HISTORY Past Medical History:  Diagnosis Date  . Brain cancer ([place])   . Cancer ([place])   . CKD (chronic kidney disease), stage III ([place])   . Dementia ([place])   . Diabetic retinopathy ([place])    NPDR OU  . High cholesterol   . Hypertensive retinopathy    OU  . Insomnia   . Lung cancer ([place])   . Osteoporosis    Past Surgical History:  Procedure Laterality Date  . ABDOMINAL HYSTERECTOMY  1986  . BRAIN SURGERY  2001   TUMOR REMOVAL  . catarac Bilateral   . CATARACT EXTRACTION Bilateral   . EYE SURGERY    . LOBECTOMY      FAMILY HISTORY Family History  Problem Relation Age of Onset  . Cancer Mother        cirrhois of liver  . Heart attack Father   . Colon cancer Father   . Asthma Brother  SOCIAL HISTORY Social History   Tobacco Use  . Smoking status: Former Smoker    Years: 33.00    Types: Cigarettes    Quit date: 04/20/1999    Years since quitting: 19.7  . Smokeless tobacco: Never Used  Substance Use Topics  . Alcohol use: Not Currently  . Drug use: No         OPHTHALMIC EXAM:  Base Eye Exam    Visual Acuity (Snellen - Linear)      Right Left   Dist [place] 20/40 -2 20/100   Dist ph [place] NI NI       Tonometry (Tonopen, 8:36 AM)      Right Left   Pressure 14 17       Pupils      Dark Light Shape React APD   Right 3 2 Round Brisk None   Left 3 2 Round Slow None       Visual Fields (Counting fingers)      Left Right    Full Full       Extraocular Movement      Right Left    Full, Ortho Full,  Ortho       Neuro/Psych    Oriented x3: Yes   Mood/Affect: Normal       Dilation    Both eyes: 1.0% Mydriacyl, 2.5% Phenylephrine @ 8:36 AM        Slit Lamp and Fundus Exam    Slit Lamp Exam      Right Left   Lids/Lashes Dermatochalasis - lower lid, Meibomian gland dysfunction, Telangiectasia Dermatochalasis - lower lid, Meibomian gland dysfunction, Telangiectasia   Conjunctiva/Sclera White and quiet White and quiet   Cornea Arcus, 1-2+ Punctate epithelial erosions Arcus, Well healed cataract wounds, mild tear film debris, trace PEE   Anterior Chamber Deep and quiet Deep and quiet   Iris Round and dilated, No NVI Round and dilated, No NVI   Lens PC IOL in good position with open PC PC IOL in good position with open PC   Vitreous Vitreous syneresis Vitreous syneresis, Posterior vitreous detachment, vitreous debris       Fundus Exam      Right Left   Disc trace Pallor, Sharp rim 1-2+ Pallor, Sharp rim   C/D Ratio 0.35 0.5   Macula Flat, Blunted foveal reflex, central Cystic changes -- increased, Microaneurysms with perifoveal cluster temporal to fovea -- improved Blunted foveal reflex, central edema, scattered Microaneurysms, focal Exudates inferior macula, RPE mottling and clumping   Vessels Vascular attenuation, mild Tortuousity Vascular attenuation, mild Tortuousity   Periphery Attached, scattered IRH, operculated hole with pigment surrounding, no SRF at 0800 Attached, rare MA          IMAGING AND PROCEDURES  Imaging and Procedures for @TODAY @  OCT, Retina - OU - Both Eyes       Right Eye Quality was good. Central Foveal Thickness: 405. Progression has worsened. Findings include abnormal foveal contour, no SRF, intraretinal fluid, intraretinal hyper-reflective material (Interval increase in IRF).   Left Eye Quality was good. Central Foveal Thickness: 462. Progression has worsened. Findings include abnormal foveal contour, intraretinal fluid, no SRF, intraretinal  hyper-reflective material, outer retinal atrophy (Interval increase in IRF).   Notes *Images captured and stored on drive  Diagnosis / Impression:  OU: interval increase in IRF  Clinical management:  See below  Abbreviations: NFP - Normal foveal profile. CME - cystoid macular edema. PED - pigment epithelial detachment. IRF - intraretinal fluid.  SRF - subretinal fluid. EZ - ellipsoid zone. ERM - epiretinal membrane. ORA - outer retinal atrophy. ORT - outer retinal tubulation. SRHM - subretinal hyper-reflective material        Fluorescein Angiography Optos (Transit OS)       Right Eye   Progression has been stable. Early phase findings include vascular perfusion defect, microaneurysm. Mid/Late phase findings include vascular perfusion defect, leakage, microaneurysm (Late hyperfluorescent staining of disc).   Left Eye   Progression has been stable. Early phase findings include microaneurysm, vascular perfusion defect. Mid/Late phase findings include leakage, microaneurysm, vascular perfusion defect (Late hyperfluorescent staining of disc).   Notes Images stored on drive;   Impression: Moderate NPDR OU No NV OU Mild scattered capillary non-perfusion OU OS with enlarged FAZ Stable from prior         Intravitreal Injection, Pharmacologic Agent - OD - Right Eye       Time Out 12/29/2018. 9:37 AM. Confirmed correct patient, procedure, site, and patient consented.   Anesthesia Topical anesthesia was used. Anesthetic medications included Lidocaine 2%, Proparacaine 0.5%.   Procedure Preparation included 5% betadine to ocular surface, eyelid speculum. A 30 gauge needle was used.   Injection:  1.25 mg Bevacizumab (AVASTIN) SOLN   NDC: 70623-762-83, Lot: [id number] , Expiration date: 02/27/2019   Route: Intravitreal, Site: Right Eye, Waste: 0 mL  Post-op Post injection exam found visual acuity of at least counting fingers. The patient tolerated the procedure well.  There were no complications. The patient received written and verbal post procedure care education.        Intravitreal Injection, Pharmacologic Agent - OS - Left Eye       Time Out 12/29/2018. 9:38 AM. Confirmed correct patient, procedure, site, and patient consented.   Anesthesia Topical anesthesia was used. Anesthetic medications included Lidocaine 2%, Proparacaine 0.5%.   Procedure Preparation included 5% betadine to ocular surface, eyelid speculum. A 30 gauge needle was used.   Injection:  1.25 mg Bevacizumab (AVASTIN) SOLN   NDC: 22/02/2019, [id number], Expiration date: 04/05/2019   Route: Intravitreal, Site: Left Eye, Waste: 0 mL  Post-op Post injection exam found visual acuity of at least counting fingers. The patient tolerated the procedure well. There were no complications. The patient received written and verbal post procedure care education.                 ASSESSMENT/PLAN:    ICD-10-CM   1. Moderate nonproliferative diabetic retinopathy of both eyes with macular edema associated with type 2 diabetes mellitus (HCC)  04/18/2019 Intravitreal Injection, Pharmacologic Agent - OD - Right Eye    Intravitreal Injection, Pharmacologic Agent - OS - Left Eye    Bevacizumab (AVASTIN) SOLN 1.25 mg    Bevacizumab (AVASTIN) SOLN 1.25 mg  2. Retinal edema  H35.81 OCT, Retina - OU - Both Eyes  3. Retinal hole of right eye  H33.321   4. Essential hypertension  I10   5. Hypertensive retinopathy of both eyes  H35.033 Fluorescein Angiography Optos (Transit OS)  6. Pseudophakia of both eyes  Z96.1     1,2. Moderate Non-proliferative diabetic retinopathy w/ DME, OU (OS>OD)  - previously a pt of Dr. J24.2683 -- per Dr. Zadie Rhine note, s/p multiple IVA OS (x7), s/p focal laser OS  - pt has never been formally diagnosed with DM -- exam very suggestive of DM  - recommend work up / A1c check by PCP  - The incidence, risk factors for progression, natural history and treatment  options  for diabetic retinopathy  were discussed with patient.    - The need for close monitoring of blood glucose, blood pressure, and serum lipids, avoiding cigarette or any type of tobacco, and the need for long term follow up was also discussed with patient.  - exam shows scattered [place]; no obvious NV  - OCT shows diabetic macular edema, OU (OS > OD)  - s/p focal laser OD (08.21.20)  - s/p IVA OU #1 (08.06.20)  - recommend IVA #2 OU today, 09.11.2020  - pt wishes to proceed  - RBA of procedure discussed, questions answered  - informed consent obtained and signed  - see procedure note  - f/u in 4 wks, DFE, OCT  3. Operculated hole OD  - located at 0800, with surrounding pigment, no SRF  - appears chronic and stable  - will monitor for now, but will likely recommend laser retinopexy OD to reinforce and prophylax  4,5 Hypertensive retinopathy OU  - discussed importance of tight BP control and its potential contribution to macular edema  - monitor  6. Pseudophakia OU  - s/p CE/IOL OU -- unknown cataract surgeon  - beautiful surgeries, doing well  - monitor   Ophthalmic Meds Ordered this visit:  Meds ordered this encounter  Medications  . Bevacizumab (AVASTIN) SOLN 1.25 mg  . Bevacizumab (AVASTIN) SOLN 1.25 mg       Return in about 4 weeks (around 01/26/2019) for f/u NPDR OU, DFE, OCT.  There are no Patient Instructions on file for this visit.   Explained the diagnoses, plan, and follow up with the patient and they expressed understanding.  Patient expressed understanding of the importance of proper follow up care.   This document serves as a record of services personally performed by Gardiner Sleeper, MD, PhD. It was created on their behalf by Roselee Nova, COMT. The creation of this record is the provider's dictation and/or activities during the visit.  Electronically signed by: Roselee Nova, COMT 12/30/18 12:28 AM   Gardiner Sleeper, M.D., Ph.D. Diseases & Surgery  of the Retina and Vitreous [facility]   I have reviewed the above documentation for accuracy and completeness, and I agree with the above. Gardiner Sleeper, M.D., Ph.D. 12/30/18 12:30 AM    Abbreviations: M myopia (nearsighted); A astigmatism; H hyperopia (farsighted); P presbyopia; Mrx spectacle prescription;  CTL contact lenses; OD right eye; OS left eye; OU both eyes  XT exotropia; ET esotropia; PEK punctate epithelial keratitis; PEE punctate epithelial erosions; DES dry eye syndrome; MGD meibomian gland dysfunction; ATs artificial tears; PFAT's preservative free artificial tears; [place] nuclear sclerotic cataract; PSC posterior subcapsular cataract; ERM epi-retinal membrane; PVD posterior vitreous detachment; RD retinal detachment; DM diabetes mellitus; DR diabetic retinopathy; NPDR non-proliferative diabetic retinopathy; PDR proliferative diabetic retinopathy; CSME clinically significant macular edema; DME diabetic macular edema; dbh dot blot hemorrhages; CWS cotton wool spot; POAG primary open angle glaucoma; C/D cup-to-disc ratio; HVF humphrey visual field; GVF goldmann visual field; OCT optical coherence tomography; IOP intraocular pressure; BRVO Branch retinal vein occlusion; CRVO central retinal vein occlusion; CRAO central retinal artery occlusion; BRAO branch retinal artery occlusion; RT retinal tear; SB scleral buckle; PPV pars plana vitrectomy; VH Vitreous hemorrhage; PRP panretinal laser photocoagulation; IVK intravitreal kenalog; VMT vitreomacular traction; MH Macular hole;  NVD neovascularization of the disc; NVE neovascularization elsewhere; AREDS age related eye disease study; ARMD age related macular degeneration; POAG primary open angle glaucoma; EBMD epithelial/anterior basement membrane dystrophy; ACIOL anterior  chamber intraocular lens; IOL intraocular lens; PCIOL posterior chamber intraocular lens; Phaco/IOL phacoemulsification with intraocular lens placement;  [place] photorefractive keratectomy; LASIK laser assisted in situ keratomileusis; HTN hypertension; DM diabetes mellitus; COPD chronic obstructive pulmonary disease

## 2018-12-29 ENCOUNTER — Encounter (INDEPENDENT_AMBULATORY_CARE_PROVIDER_SITE_OTHER): Payer: Self-pay | Admitting: Ophthalmology

## 2018-12-29 ENCOUNTER — Ambulatory Visit (INDEPENDENT_AMBULATORY_CARE_PROVIDER_SITE_OTHER): Payer: Medicare Other | Admitting: Ophthalmology

## 2018-12-29 ENCOUNTER — Other Ambulatory Visit: Payer: Self-pay

## 2018-12-29 DIAGNOSIS — I10 Essential (primary) hypertension: Secondary | ICD-10-CM

## 2018-12-29 DIAGNOSIS — E113313 Type 2 diabetes mellitus with moderate nonproliferative diabetic retinopathy with macular edema, bilateral: Secondary | ICD-10-CM | POA: Diagnosis not present

## 2018-12-29 DIAGNOSIS — H35033 Hypertensive retinopathy, bilateral: Secondary | ICD-10-CM

## 2018-12-29 DIAGNOSIS — H33321 Round hole, right eye: Secondary | ICD-10-CM | POA: Diagnosis not present

## 2018-12-29 DIAGNOSIS — H3581 Retinal edema: Secondary | ICD-10-CM | POA: Diagnosis not present

## 2018-12-29 DIAGNOSIS — Z961 Presence of intraocular lens: Secondary | ICD-10-CM

## 2018-12-29 MED ORDER — BEVACIZUMAB CHEMO INJECTION 1.25MG/0.05ML SYRINGE FOR KALEIDOSCOPE
1.2500 mg | INTRAVITREAL | Status: AC | PRN
  Administered 2018-12-29: 1.25 mg via INTRAVITREAL

## 2019-01-02 ENCOUNTER — Ambulatory Visit (INDEPENDENT_AMBULATORY_CARE_PROVIDER_SITE_OTHER): Payer: Medicare Other | Admitting: Licensed Clinical Social Worker

## 2019-01-02 DIAGNOSIS — N183 Chronic kidney disease, stage 3 unspecified: Secondary | ICD-10-CM

## 2019-01-02 DIAGNOSIS — M810 Age-related osteoporosis without current pathological fracture: Secondary | ICD-10-CM | POA: Diagnosis not present

## 2019-01-02 DIAGNOSIS — E785 Hyperlipidemia, unspecified: Secondary | ICD-10-CM | POA: Diagnosis not present

## 2019-01-02 DIAGNOSIS — R413 Other amnesia: Secondary | ICD-10-CM

## 2019-01-02 DIAGNOSIS — Z9181 History of falling: Secondary | ICD-10-CM

## 2019-01-02 NOTE — Chronic Care Management (AMB) (Signed)
Care Management Note   Jamie [name] is a 68 y.o. year old female who is a primary care patient of Jamie Sleeper, PA-C. The CM team was consulted for assistance with chronic disease management and care coordination.   I reached out to Jamie [name] by phone today.   Review of patient status, including review of consultants reports, relevant laboratory and other test results, and collaboration with appropriate care team members and the patient's provider was performed as part of comprehensive patient evaluation and provision of chronic care management services.   Social Determinants of Health: risk for depression; risk for tobacco use; risk for social isolation    Chronic Care Management from 07/17/2018 in [place]  PHQ-9 Total Score  4     Medications    donepezil (ARICEPT) 5 MG tablet    folic acid (FOLVITE) 1 MG tablet    Ibuprofen-diphenhydrAMINE Cit (ADVIL PM PO)    Multiple Vitamin (MULTIVITAMIN) tablet    rosuvastatin (CRESTOR) 10 MG tablet     Goals    .         Marland Kitchen Client said she wants help in improving her living situation and wants to look for other housing options in area. (pt-stated)     Current Barriers:  . Financial constraints . Limited social support  Clinical Social Work Clinical Goal(s):  Marland Kitchen Over next 30 days, client will work with Jamie [name] to address concerns related to housing issues of client and discuss housing options for client in the area.   Interventions: . Encouraged client to call Jamie [name] to discuss psychosocial needs of client . Talked with client about client safety status . Encouraged client to talk with son and friends about paying some of the household bills.  . Encouraged client to use relaxation techniques of choice to help her manage stress issues . Talked with client about self care activities of choice  Patient Self Care Activities:  . Attends all scheduled provider appointments  . Performs ADL's independently .  Contacts Jamie [name], caregiver to discuss client needs  Plan:  . Patient will contact Jamie [name] to discuss psychosical needs of client   . Jamie [name] will call client in three weeks to assess client housing needs . Patient will communicate with RN CM to discuss nursing needs of client . Client to call 911 for emergency assistance as needed.  . Client to attend scheduled client medical appointments  Initial goal documentation    Client did not mention any pain issues.  She has her prescribed medications and is taking medications as prescribed. She said she wants to continue residing at her current residence. She said she feels safe at current residence and wants to remain at current residence.  She needs some assistance with ADLs. Client said that her son is now managing client's money for her. She said she was pleased with this arrangement for son to manage her money for her.  Client said she had a little money in savings. Client is attending client's medical appointments. She has support from her son. Jamie [name] encouraged Jamie [name] to call RNCM as needed to discuss nursing needs of client. Client said she had recent appointment with opthamologist. She has another appointment with opthamologist on 01/23/2019. She said she has prescribed eye drops she uses 4 times daily. She likes to watch TV to help her relax. She likes cleaning house to help her relax. She ambulates with no use of assistive device. She said she is sleeping well. Jamie [name]  thanked Jamie [name] for phone call with Jamie [name] on 01/02/2019   Follow Up Plan: Jamie [name] to call client in next 3 weeks to assess client housing needs.  Jamie [name].Jamie [name] MSW, Jamie [name] Licensed Clinical Social Worker [facility] Care [facility] [phone]

## 2019-01-02 NOTE — Patient Instructions (Signed)
Licensed Clinical Social Worker Visit Information  Goals we discussed today:  Goals        . Client said she wants help in improving her living situation and wants to look for other housing options in area. (pt-stated)     Current Barriers:  . Financial constraints . Limited social support  Clinical Social Work Clinical Goal(s):  Marland Kitchen Over next 30 days, client will work with LCSW to address concerns related to housing issues of client and discuss housing options for client in the area.   Interventions: . Encouraged client to call LCSW to discuss psychosocial needs of client . Talked with client about client safety status . Encouraged client to talk with son and friends about paying some of the household bills.  . Encouraged client to use relaxation techniques of choice to help her manage stress issues . Talked with client about self care activities of choice  Patient Self Care Activities:  . Attends all scheduled provider appointments  . Performs ADL's independently . Contacts [name], caregiver to discuss client needs  Plan:  . Patient will contact LCSW to discuss psychosical needs of client   . LCSW will call client in three weeks to assess client housing needs . Patient will communicate with RN CM to discuss nursing needs of client . Client to call 911 for emergency assistance as needed.  . Client to attend scheduled client medical appointments  Initial goal documentation       Materials Provided: No  Follow Up Plan: LCSW to call client in next 3 weeks to assess client housing needs  The patient verbalized understanding of instructions provided today and declined a print copy of patient instruction materials.   Norva Riffle.Mattison Golay MSW, LCSW Licensed Clinical Social Worker [facility] [phone]

## 2019-01-24 ENCOUNTER — Ambulatory Visit: Payer: Self-pay | Admitting: Licensed Clinical Social Worker

## 2019-01-24 DIAGNOSIS — Z85118 Personal history of other malignant neoplasm of bronchus and lung: Secondary | ICD-10-CM

## 2019-01-24 DIAGNOSIS — N183 Chronic kidney disease, stage 3 unspecified: Secondary | ICD-10-CM

## 2019-01-24 DIAGNOSIS — M810 Age-related osteoporosis without current pathological fracture: Secondary | ICD-10-CM

## 2019-01-24 DIAGNOSIS — Z85841 Personal history of malignant neoplasm of brain: Secondary | ICD-10-CM

## 2019-01-24 DIAGNOSIS — R413 Other amnesia: Secondary | ICD-10-CM

## 2019-01-24 DIAGNOSIS — Z9181 History of falling: Secondary | ICD-10-CM

## 2019-01-24 DIAGNOSIS — E785 Hyperlipidemia, unspecified: Secondary | ICD-10-CM

## 2019-01-24 NOTE — Patient Instructions (Signed)
Licensed Clinical Social Worker Visit Information  Goals we discussed today:  Goals        . Client said she wants help in improving her living situation and wants to look for other housing options in area. (pt-stated)     Current Barriers:  . Financial constraints . Limited social support  Clinical Social Work Clinical Goal(s):  Marland Kitchen Over next 30 days, client will work with LCSW to address concerns related to housing issues of client and discuss housing options for client in the area.   Interventions: . Encouraged client to call LCSW to discuss psychosocial needs of client . Talked with client about client safety status . Encouraged client to use relaxation techniques of choice to help her manage stress issues . Talked with client about self care activities of choice . Talked with client about upcoming client appointments  Patient Self Care Activities:  . Attends all scheduled provider appointments  . Performs ADL's independently . Contacts [name], caregiver to discuss client needs  Plan:  . Patient will contact LCSW to discuss psychosical needs of client   . LCSW will call client in three weeks to assess client housing needs . Patient will communicate with RN CM to discuss nursing needs of client . Client to call 911 for emergency assistance as needed.  . Client to attend scheduled client medical appointments  Initial goal documentation      Materials Provided: No  Follow Up Plan: LCSW to call client in next 3 weeks to assess client housing needs at that time  The patient verbalized understanding of instructions provided today and declined a print copy of patient instruction materials.   Norva Riffle.Marise Knapper MSW, LCSW Licensed Clinical Social Worker [facility] [phone]

## 2019-01-24 NOTE — Chronic Care Management (AMB) (Signed)
  Care Management Note   Jamie [name] is a 68 y.o. year old female who is a primary care patient of Jamie Sleeper, PA-C. The CM team was consulted for assistance with chronic disease management and care coordination.   I reached out to Jamie [name] by phone today.    Review of patient status, including review of consultants reports, relevant laboratory and other test results, and collaboration with appropriate care team members and the patient's provider was performed as part of comprehensive patient evaluation and provision of chronic care management services.   Social Determinants of Health: risk of depression; risk of tobacco use; risk of social isolation; risk of physical inactivity    Chronic Care Management from 07/17/2018 in [place]  PHQ-9 Total Score  4     Medications    donepezil (ARICEPT) 5 MG tablet    folic acid (FOLVITE) 1 MG tablet    Ibuprofen-diphenhydrAMINE Cit (ADVIL PM PO)    Multiple Vitamin (MULTIVITAMIN) tablet    rosuvastatin (CRESTOR) 10 MG tablet     Goals    .         Marland Kitchen Client said she wants help in improving her living situation and wants to look for other housing options in area. (pt-stated)     Current Barriers:  . Financial constraints . Limited social support  Clinical Social Work Clinical Goal(s):  Marland Kitchen Over next 30 days, client will work with LCSW to address concerns related to housing issues of client and discuss housing options for client in the area.   Interventions: . Encouraged client to call LCSW to discuss psychosocial needs of client . Talked with client about client safety status . Encouraged client to use relaxation techniques of choice to help her manage stress issues . Talked with client about self care activities of choice . Talked with client about upcoming client appointments  Patient Self Care Activities:  . Attends all scheduled provider appointments  . Performs ADL's independently . Contacts  [name], caregiver to discuss client needs  Plan:  . Patient will contact LCSW to discuss psychosical needs of client   . LCSW will call client in three weeks to assess client housing needs . Patient will communicate with RN CM to discuss nursing needs of client . Client to call 911 for emergency assistance as needed.  . Client to attend scheduled client medical appointments  Initial goal documentation      Follow Up Plan: LCSW will call client in next 3 weeks to assess housing needs of client at that time  Norva Riffle.Katrece Roediger MSW, LCSW Licensed Clinical Social Worker [facility] Care [facility] [phone]

## 2019-01-26 ENCOUNTER — Encounter (INDEPENDENT_AMBULATORY_CARE_PROVIDER_SITE_OTHER): Payer: Medicare Other | Admitting: Ophthalmology

## 2019-01-30 NOTE — Progress Notes (Signed)
[facility] Note  02/02/2019     CHIEF COMPLAINT Patient presents for Retina Follow Up   HISTORY OF PRESENT ILLNESS: Jamie [name] is a 68 y.o. female who presents to the clinic today for:   HPI    Retina Follow Up    Patient presents with  Diabetic Retinopathy.  In both eyes.  This started months ago.  Severity is moderate.  Duration of 5 weeks.  Since onset it is stable.  I, the attending physician,  performed the HPI with the patient and updated documentation appropriately.          Comments    68 y/o female pt here for 5 wk f/u for mod NPDR w/mac edema OU.  S/p IVA #2 OU 9.11.20.  No change in [place] OU.  Denies flashes, new floaters, but c/o pain when putting gtts in OS.  Does not know name of gtt, but says it has a white top and she uses it QID OS.  BS and A1C unknown, but "good."       Last edited by Bernarda Caffey, MD on 02/02/2019  3:18 PM. (History)    pt    Referring physician: Melina Schools, [address],  [place] [id number]  HISTORICAL INFORMATION:   Selected notes from the MEDICAL RECORD NUMBER Referred by Dr. Melina Schools for concern of retinal heme  LEE: 02.17.20 Ricci Barker) [BCVA: OD: 20/25- OS: 20/80+]  Ocular Hx-CSME, BRVO, macular cyst, optic neuropathy, DES, pseudo OU, s/p IVA (7 total, 2008, Dr. Zadie Rhine), s/p focal laser (2008), s/p YAG (2011) PMH-brain tumor, cancer, HLD, lung cancer   CURRENT MEDICATIONS: No current outpatient medications on file. (Ophthalmic Drugs)   No current facility-administered medications for this visit.  (Ophthalmic Drugs)   Current Outpatient Medications (Other)  Medication Sig  . donepezil (ARICEPT) 5 MG tablet Take 1 tablet (5 mg total) by mouth at bedtime.  . folic acid (FOLVITE) 1 MG tablet Take 1 tablet (1 mg total) by mouth daily.  . Ibuprofen-diphenhydrAMINE Cit (ADVIL PM PO) Take by mouth at bedtime.  . Multiple Vitamin (MULTIVITAMIN) tablet Take 1 tablet by mouth daily.  .  rosuvastatin (CRESTOR) 10 MG tablet Take 1 tablet (10 mg total) by mouth at bedtime.   No current facility-administered medications for this visit.  (Other)      REVIEW OF SYSTEMS: ROS    Positive for: Gastrointestinal, Musculoskeletal, Endocrine, Eyes, Psychiatric   Negative for: Constitutional, Neurological, Skin, Genitourinary, HENT, Cardiovascular, Respiratory, Allergic/Imm, Heme/Lymph   Last edited by Matthew Folks, COA on 02/02/2019  2:53 PM. (History)       ALLERGIES Allergies  Allergen Reactions  . Fosamax [Alendronate Sodium] Other (See Comments)    "unknown"  . Sulfonamide Derivatives     REACTION: Hives, tongue swells    PAST MEDICAL HISTORY Past Medical History:  Diagnosis Date  . Brain cancer ([place])   . Cancer ([place])   . CKD (chronic kidney disease), stage III   . Dementia ([place])   . Diabetic retinopathy ([place])    NPDR OU  . High cholesterol   . Hypertensive retinopathy    OU  . Insomnia   . Lung cancer ([place])   . Osteoporosis    Past Surgical History:  Procedure Laterality Date  . ABDOMINAL HYSTERECTOMY  1986  . BRAIN SURGERY  2001   TUMOR REMOVAL  . catarac Bilateral   . CATARACT EXTRACTION Bilateral   . EYE SURGERY    .  LOBECTOMY      FAMILY HISTORY Family History  Problem Relation Age of Onset  . Cancer Mother        cirrhois of liver  . Heart attack Father   . Colon cancer Father   . Asthma Brother     SOCIAL HISTORY Social History   Tobacco Use  . Smoking status: Former Smoker    Years: 33.00    Types: Cigarettes    Quit date: 04/20/1999    Years since quitting: 19.8  . Smokeless tobacco: Never Used  Substance Use Topics  . Alcohol use: Not Currently  . Drug use: No         OPHTHALMIC EXAM:  Base Eye Exam    Visual Acuity (Snellen - Linear)      Right Left   Dist [place] 20/60 + 20/150 -2   Dist ph [place] 20/40 20/100       Tonometry (Tonopen, 2:57 PM)      Right Left   Pressure 15 17       Pupils      Dark Light  Shape React APD   Right 3 2 Round Brisk None   Left 3 2 Round Brisk None       Visual Fields (Counting fingers)      Left Right    Full Full       Extraocular Movement      Right Left    Full, Ortho Full, Ortho       Neuro/Psych    Oriented x3: Yes   Mood/Affect: Normal       Dilation    Both eyes: 1.0% Mydriacyl, 2.5% Phenylephrine @ 2:57 PM        Slit Lamp and Fundus Exam    Slit Lamp Exam      Right Left   Lids/Lashes Dermatochalasis - lower lid, Meibomian gland dysfunction, Telangiectasia Dermatochalasis - lower lid, Meibomian gland dysfunction, Telangiectasia   Conjunctiva/Sclera White and quiet White and quiet   Cornea Arcus, 1-2+ Punctate epithelial erosions Arcus, Well healed cataract wounds, mild tear film debris, trace PEE   Anterior Chamber Deep and quiet Deep and quiet   Iris Round and dilated, No NVI Round and dilated, No NVI   Lens PC IOL in good position with open PC PC IOL in good position with open PC   Vitreous Vitreous syneresis Vitreous syneresis, Posterior vitreous detachment, vitreous debris       Fundus Exam      Right Left   Disc trace Pallor, Sharp rim 1-2+ Pallor, Sharp rim   C/D Ratio 0.35 0.5   Macula Flat, Blunted foveal reflex, central Cystic changes -- slightly improved, Microaneurysms with perifoveal cluster temporal to fovea -- improved Blunted foveal reflex, central edema, scattered Microaneurysms, focal Exudates inferior macula, RPE mottling and clumping   Vessels Vascular attenuation, mild Tortuousity Vascular attenuation, mild Tortuousity   Periphery Attached, scattered IRH, operculated hole with pigment surrounding, no SRF at 0800 Attached, rare MA          IMAGING AND PROCEDURES  Imaging and Procedures for _0 @  OCT, Retina - OU - Both Eyes       Right Eye Quality was good. Central Foveal Thickness: 436. Progression has improved. Findings include abnormal foveal contour, no SRF, intraretinal fluid, intraretinal  hyper-reflective material (Mild Interval improvement in IRF).   Left Eye Quality was good. Central Foveal Thickness: 424. Progression has improved. Findings include abnormal foveal contour, intraretinal fluid, no SRF, intraretinal hyper-reflective material,  outer retinal atrophy (Interval improvement in IRF).   Notes *Images captured and stored on drive  Diagnosis / Impression:  mild interval improvement in IRF OU  Clinical management:  See below  Abbreviations: NFP - Normal foveal profile. CME - cystoid macular edema. PED - pigment epithelial detachment. IRF - intraretinal fluid. SRF - subretinal fluid. EZ - ellipsoid zone. ERM - epiretinal membrane. ORA - outer retinal atrophy. ORT - outer retinal tubulation. SRHM - subretinal hyper-reflective material        Repair Retinal Breaks, Laser - OD - Right Eye       LASER PROCEDURE NOTE  Procedure:  Barrier laser retinopexy using slit lamp laser, RIGHT eye   Diagnosis:   Operculated retinal hole, RIGHT eye                     8 o'clock anterior to equator   Surgeon: Bernarda Caffey, MD, PhD  Anesthesia: Topical  Informed consent obtained, operative eye marked, and time out performed prior to initiation of laser.   Laser settings:  Lumenis Smart532 laser, slit lamp Lens: [place] PRP 165 Power: 270 mW Spot size: 200 microns Duration: 30 msec  # spots: 183  Placement of laser: Using a [place] PRP 165 contact lens at the slit lamp, laser was placed in three confluent rows around operculated retinal hole at 8 oclock anterior to equator. Indirect laser ophthalmoscopy w/ scleral depression was used to complete the anterior portion of the laser retinopexy:  231 spots, 280 mW power, 70 ms duration.  Complications: None. Pt with difficulty holding OD in far right gaze / abduction.  Patient tolerated the procedure well and received written and verbal post-procedure care information/education.         Intravitreal Injection,  Pharmacologic Agent - OD - Right Eye       Time Out 02/02/2019. 4:12 PM. Confirmed correct patient, procedure, site, and patient consented.   Anesthesia Topical anesthesia was used. Anesthetic medications included Lidocaine 2%, Proparacaine 0.5%.   Procedure Preparation included 5% betadine to ocular surface, eyelid speculum. A supplied needle was used.   Injection:  1.25 mg Bevacizumab (AVASTIN) SOLN   NDC: 81829-937-16, Lot: [id number] , Expiration date: 04/04/2019   Route: Intravitreal, Site: Right Eye, Waste: 0 mL  Post-op Post injection exam found visual acuity of at least counting fingers. The patient tolerated the procedure well. There were no complications. The patient received written and verbal post procedure care education.        Intravitreal Injection, Pharmacologic Agent - OS - Left Eye       Time Out 02/02/2019. 4:13 PM. Confirmed correct patient, procedure, site, and patient consented.   Anesthesia Topical anesthesia was used. Anesthetic medications included Lidocaine 2%, Proparacaine 0.5%.   Procedure Preparation included 5% betadine to ocular surface, eyelid speculum. A supplied needle was used.   Injection:  1.25 mg Bevacizumab (AVASTIN) SOLN   NDC: 96789-381-01, Lot: [id number] , Expiration date: 02/28/2019   Route: Intravitreal, Site: Left Eye, Waste: 0 mL  Post-op Post injection exam found visual acuity of at least counting fingers. The patient tolerated the procedure well. There were no complications. The patient received written and verbal post procedure care education.                 ASSESSMENT/PLAN:    ICD-10-CM   1. Moderate nonproliferative diabetic retinopathy of both eyes with macular edema associated with type 2 diabetes mellitus (HCC)  B51.0258 Intravitreal Injection, Pharmacologic Agent - OD - Right  Eye    Intravitreal Injection, Pharmacologic Agent - OS - Left Eye    Bevacizumab (AVASTIN) SOLN 1.25 mg    Bevacizumab  (AVASTIN) SOLN 1.25 mg  2. Retinal edema  H35.81 OCT, Retina - OU - Both Eyes  3. Retinal hole of right eye  H33.321 Repair Retinal Breaks, Laser - OD - Right Eye  4. Essential hypertension  I10   5. Hypertensive retinopathy of both eyes  H35.033   6. Pseudophakia of both eyes  Z96.1     1,2. Moderate Non-proliferative diabetic retinopathy w/ DME, OU (OS>OD)  - previously a pt of Dr. Zadie Rhine -- per Dr. Rona Ravens note, s/p multiple IVA OS (x7), s/p focal laser OS  - pt has never been formally diagnosed with DM -- exam very suggestive of DM  - recommend work up / A1c check by PCP  - exam shows scattered [place] OU; no obvious NV  - OCT shows diabetic macular edema, OU (OS > OD)  - s/p focal laser OD (08.21.20)  - s/p IVA OU #1 (08.06.20), #2 (09.11.20)  - recommend IVA OU #3 today, 10.16.20  - pt wishes to proceed  - RBA of procedure discussed, questions answered  - informed consent obtained and signed  - see procedure notes  - f/u in 4 wks, DFE, OCT  3. Operculated hole OD  - located at 0800, with surrounding pigment, no SRF  - The incidence, risk factors, and natural history of retinal tear was discussed with patient.    - Potential treatment options including laser retinopexy and cryotherapy discussed with patient.  - recommend laser retinopexy OD today 10.16.20 for prophylaxis  - RBA of procedure discussed, questions answered  - informed consent obtained and signed  - see procedure note  - start PF QID OD x7 days  - f/u in 4 wks  4,5 Hypertensive retinopathy OU  - discussed importance of tight BP control and its potential contribution to macular edema  - monitor  6. Pseudophakia OU  - s/p CE/IOL OU -- unknown cataract surgeon  - beautiful surgeries, doing well  - monitor   Ophthalmic Meds Ordered this visit:  Meds ordered this encounter  Medications  . Bevacizumab (AVASTIN) SOLN 1.25 mg  . Bevacizumab (AVASTIN) SOLN 1.25 mg       Return in about 4 weeks (around  03/02/2019) for f/u NPDR OU, DFE, OCT.  There are no Patient Instructions on file for this visit.   Explained the diagnoses, plan, and follow up with the patient and they expressed understanding.  Patient expressed understanding of the importance of proper follow up care.   This document serves as a record of services personally performed by Gardiner Sleeper, MD, PhD. It was created on their behalf by Roselee Nova, COMT. The creation of this record is the provider's dictation and/or activities during the visit.  Electronically signed by: Roselee Nova, COMT 02/02/19 4:27 PM   Gardiner Sleeper, M.D., Ph.D. Diseases & Surgery of the Retina and Vitreous [facility] 02/02/19  I have reviewed the above documentation for accuracy and completeness, and I agree with the above. Gardiner Sleeper, M.D., Ph.D. 02/02/19 4:27 PM   Abbreviations: M myopia (nearsighted); A astigmatism; H hyperopia (farsighted); P presbyopia; Mrx spectacle prescription;  CTL contact lenses; OD right eye; OS left eye; OU both eyes  XT exotropia; ET esotropia; PEK punctate epithelial keratitis; PEE punctate epithelial erosions; DES dry eye syndrome; MGD meibomian gland dysfunction; ATs artificial tears;  PFAT's preservative free artificial tears; [place] nuclear sclerotic cataract; PSC posterior subcapsular cataract; ERM epi-retinal membrane; PVD posterior vitreous detachment; RD retinal detachment; DM diabetes mellitus; DR diabetic retinopathy; NPDR non-proliferative diabetic retinopathy; PDR proliferative diabetic retinopathy; CSME clinically significant macular edema; DME diabetic macular edema; dbh dot blot hemorrhages; CWS cotton wool spot; POAG primary open angle glaucoma; C/D cup-to-disc ratio; HVF humphrey visual field; GVF goldmann visual field; OCT optical coherence tomography; IOP intraocular pressure; BRVO Branch retinal vein occlusion; CRVO central retinal vein occlusion; CRAO central retinal artery  occlusion; BRAO branch retinal artery occlusion; RT retinal tear; SB scleral buckle; PPV pars plana vitrectomy; VH Vitreous hemorrhage; PRP panretinal laser photocoagulation; IVK intravitreal kenalog; VMT vitreomacular traction; MH Macular hole;  NVD neovascularization of the disc; NVE neovascularization elsewhere; AREDS age related eye disease study; ARMD age related macular degeneration; POAG primary open angle glaucoma; EBMD epithelial/anterior basement membrane dystrophy; ACIOL anterior chamber intraocular lens; IOL intraocular lens; PCIOL posterior chamber intraocular lens; Phaco/IOL phacoemulsification with intraocular lens placement; [place] photorefractive keratectomy; LASIK laser assisted in situ keratomileusis; HTN hypertension; DM diabetes mellitus; COPD chronic obstructive pulmonary disease

## 2019-02-02 ENCOUNTER — Ambulatory Visit (INDEPENDENT_AMBULATORY_CARE_PROVIDER_SITE_OTHER): Payer: Medicare Other | Admitting: Ophthalmology

## 2019-02-02 ENCOUNTER — Encounter (INDEPENDENT_AMBULATORY_CARE_PROVIDER_SITE_OTHER): Payer: Self-pay | Admitting: Ophthalmology

## 2019-02-02 DIAGNOSIS — H33321 Round hole, right eye: Secondary | ICD-10-CM

## 2019-02-02 DIAGNOSIS — H35033 Hypertensive retinopathy, bilateral: Secondary | ICD-10-CM

## 2019-02-02 DIAGNOSIS — E113313 Type 2 diabetes mellitus with moderate nonproliferative diabetic retinopathy with macular edema, bilateral: Secondary | ICD-10-CM

## 2019-02-02 DIAGNOSIS — Z961 Presence of intraocular lens: Secondary | ICD-10-CM

## 2019-02-02 DIAGNOSIS — H3581 Retinal edema: Secondary | ICD-10-CM | POA: Diagnosis not present

## 2019-02-02 DIAGNOSIS — I10 Essential (primary) hypertension: Secondary | ICD-10-CM | POA: Diagnosis not present

## 2019-02-02 MED ORDER — BEVACIZUMAB CHEMO INJECTION 1.25MG/0.05ML SYRINGE FOR KALEIDOSCOPE
1.2500 mg | INTRAVITREAL | Status: AC | PRN
  Administered 2019-02-02: 1.25 mg via INTRAVITREAL

## 2019-02-14 ENCOUNTER — Ambulatory Visit (INDEPENDENT_AMBULATORY_CARE_PROVIDER_SITE_OTHER): Payer: Medicare Other | Admitting: Licensed Clinical Social Worker

## 2019-02-14 DIAGNOSIS — Z85118 Personal history of other malignant neoplasm of bronchus and lung: Secondary | ICD-10-CM | POA: Diagnosis not present

## 2019-02-14 DIAGNOSIS — E785 Hyperlipidemia, unspecified: Secondary | ICD-10-CM

## 2019-02-14 DIAGNOSIS — Z9181 History of falling: Secondary | ICD-10-CM

## 2019-02-14 DIAGNOSIS — R413 Other amnesia: Secondary | ICD-10-CM

## 2019-02-14 DIAGNOSIS — M810 Age-related osteoporosis without current pathological fracture: Secondary | ICD-10-CM

## 2019-02-14 DIAGNOSIS — Z85841 Personal history of malignant neoplasm of brain: Secondary | ICD-10-CM

## 2019-02-14 NOTE — Chronic Care Management (AMB) (Signed)
  Care Management Note   Jamie ODWYER is a 68 y.o. year old female who is a primary care patient of Terald Sleeper, PA-C. The CM team was consulted for assistance with chronic disease management and care coordination.   I reached out to Magdalen Spatz, son, by phone today.   Review of patient status, including review of consultants reports, relevant laboratory and other test results, and collaboration with appropriate care team members and the patient's provider was performed as part of comprehensive patient evaluation and provision of chronic care management services.   Social determinants of health: risk of social isolation; risk of tobacco use; risk of physical inactivity    Chronic Care Management from 07/17/2018 in [facility]  PHQ-9 Total Score  4     Medications    donepezil (ARICEPT) 5 MG tablet    folic acid (FOLVITE) 1 MG tablet    Ibuprofen-diphenhydrAMINE Cit (ADVIL PM PO)    Multiple Vitamin (MULTIVITAMIN) tablet    rosuvastatin (CRESTOR) 10 MG tablet     Goals        . Client said she wants help in improving her living situation and wants to look for other housing options in area. (pt-stated)     Current Barriers:  . Financial constraints . Limited social support  Clinical Social Work Clinical Goal(s):  Marland Kitchen Over next 30 days, client will work with LCSW to address concerns related to housing issues of client and discuss housing options for client in the area.   Interventions: . Encouraged client previously to call LCSW to discuss psychosocial needs of client . Talked previously with client about client safety status  Encouraged client previously to use relaxation techniques of choice to help her manage stress issues    Talked previously with client about self care activities of choice  Talked with Heron Sabins about daily care needs of client    Talked with Heron Sabins about recent eye appointments of [place].   Patient Self  Care Activities:  . Attends all scheduled provider appointments  . Performs ADL's independently . Contacts [name], caregiver to discuss client needs  Plan:  . Patient will contact LCSW to discuss psychosical needs of client   . LCSW will call client in three weeks to assess client housing needs . Patient will communicate with RN CM to discuss nursing needs of client . Client to call 911 for emergency assistance as needed.  . Client to attend scheduled client medical appointments  Initial goal documentation        Follow Up Plan: LCSW will call client in next 3 weeks to assess client housing needs at that time and to assess social work needs of client at that time  Norva Riffle.Maxi Carreras MSW, LCSW Licensed Clinical Social Worker [facility] Care Management [phone]

## 2019-02-14 NOTE — Patient Instructions (Addendum)
Licensed Clinical Social Worker Visit Information  Goals we discussed today:  Goals        . Client said she wants help in improving her living situation and wants to look for other housing options in area. (pt-stated)     Current Barriers:  . Financial constraints . Limited social support  Clinical Social Work Clinical Goal(s):  Marland Kitchen Over next 30 days, client will work with LCSW to address concerns related to housing issues of client and discuss housing options for client in the area.   Interventions: . Encouraged client previously to call LCSW to discuss psychosocial needs of client . Talked previously with client about client safety status . Previously encouraged client to use relaxation techniques of choice to help her manage stress issues . Talked previously with client about self care activities of choice   Talked with Jamie [name] about daily care needs of client    Talked with Jamie [name] about recent eye appointments of [facility].   Patient Self Care Activities:  . Attends all scheduled provider appointments  . Performs ADL's independently . Contacts Jamie [name], caregiver to discuss client needs  Plan:  . Patient will contact LCSW to discuss psychosical needs of client   . LCSW will call client in three weeks to assess client housing needs . Patient will communicate with RN CM to discuss nursing needs of client . Client to call 911 for emergency assistance as needed.  . Client to attend scheduled client medical appointments  Initial goal documentation            Materials Provided: No  Follow Up Plan:  LCSW will call client in next 3 weeks to assess housing needs of client and to assess social work needs of client  The patient /Jamie [name], son, verbalized understanding of instructions provided today and declined a print copy of patient instruction materials.   Jamie [name].[name] MSW, LCSW Licensed Clinical Social Worker [facility]  Care Management [phone]

## 2019-02-27 NOTE — Progress Notes (Signed)
[facility] Note  03/02/2019     CHIEF COMPLAINT Patient presents for Retina Follow Up   HISTORY OF PRESENT ILLNESS: Jamie [name] is a 68 y.o. female who presents to the clinic today for:   HPI    Retina Follow Up    Patient presents with  Diabetic Retinopathy.  In both eyes.  This started weeks ago.  Severity is moderate.  Duration of weeks.  Since onset it is stable.  I, the attending physician,  performed the HPI with the patient and updated documentation appropriately.          Comments    Pt states her vision is improving.  She has occasional eye pain OU and denies any new or worsening floaters or fol OU.       Last edited by Bernarda Caffey, MD on 03/02/2019  2:21 PM. (History)      Referring physician: Melina Schools, [place],  [place] [address]  HISTORICAL INFORMATION:   Selected notes from the MEDICAL RECORD NUMBER Referred by Dr. Melina Schools for concern of retinal heme  LEE: 02.17.20 Ricci Barker) [BCVA: OD: 20/25- OS: 20/80+]  Ocular Hx-CSME, BRVO, macular cyst, optic neuropathy, DES, pseudo OU, s/p IVA (7 total, 2008, Dr. Zadie Rhine), s/p focal laser (2008), s/p YAG (2011) PMH-brain tumor, cancer, HLD, lung cancer   CURRENT MEDICATIONS: No current outpatient medications on file. (Ophthalmic Drugs)   No current facility-administered medications for this visit.  (Ophthalmic Drugs)   Current Outpatient Medications (Other)  Medication Sig  . donepezil (ARICEPT) 5 MG tablet Take 1 tablet (5 mg total) by mouth at bedtime.  . folic acid (FOLVITE) 1 MG tablet Take 1 tablet (1 mg total) by mouth daily.  . Ibuprofen-diphenhydrAMINE Cit (ADVIL PM PO) Take by mouth at bedtime.  . Multiple Vitamin (MULTIVITAMIN) tablet Take 1 tablet by mouth daily.  . rosuvastatin (CRESTOR) 10 MG tablet Take 1 tablet (10 mg total) by mouth at bedtime.   No current facility-administered medications for this visit.  (Other)      REVIEW OF  SYSTEMS: ROS    Positive for: Gastrointestinal, Musculoskeletal, Endocrine, Eyes, Psychiatric   Negative for: Constitutional, Neurological, Skin, Genitourinary, HENT, Cardiovascular, Respiratory, Allergic/Imm, Heme/Lymph   Last edited by Doneen Poisson on 03/02/2019  1:48 PM. (History)       ALLERGIES Allergies  Allergen Reactions  . Fosamax [Alendronate Sodium] Other (See Comments)    "unknown"  . Sulfonamide Derivatives     REACTION: Hives, tongue swells    PAST MEDICAL HISTORY Past Medical History:  Diagnosis Date  . Brain cancer ([place])   . Cancer ([place])   . CKD (chronic kidney disease), stage III   . Dementia ([place])   . Diabetic retinopathy ([place])    NPDR OU  . High cholesterol   . Hypertensive retinopathy    OU  . Insomnia   . Lung cancer ([place])   . Osteoporosis    Past Surgical History:  Procedure Laterality Date  . ABDOMINAL HYSTERECTOMY  1986  . BRAIN SURGERY  2001   TUMOR REMOVAL  . catarac Bilateral   . CATARACT EXTRACTION Bilateral   . EYE SURGERY    . LOBECTOMY      FAMILY HISTORY Family History  Problem Relation Age of Onset  . Cancer Mother        cirrhois of liver  . Heart attack Father   . Colon cancer Father   .  Asthma Brother     SOCIAL HISTORY Social History   Tobacco Use  . Smoking status: Former Smoker    Years: 33.00    Types: Cigarettes    Quit date: 04/20/1999    Years since quitting: 19.8  . Smokeless tobacco: Never Used  Substance Use Topics  . Alcohol use: Not Currently  . Drug use: No         OPHTHALMIC EXAM:  Base Eye Exam    Visual Acuity (Snellen - Linear)      Right Left   Dist [place] 20/40 -2 20/150 -1   Dist ph [place] 20/40 NI       Tonometry (Tonopen, 1:52 PM)      Right Left   Pressure 17 18       Pupils      Dark Light Shape React APD   Right 3 2 Round Minimal 0   Left 3 2 Round Minimal 0       Visual Fields      Left Right    Full Full       Extraocular Movement      Right Left    Full Full        Neuro/Psych    Oriented x3: Yes   Mood/Affect: Normal       Dilation    Both eyes: 1.0% Mydriacyl, 2.5% Phenylephrine @ 1:52 PM        Slit Lamp and Fundus Exam    Slit Lamp Exam      Right Left   Lids/Lashes Dermatochalasis - lower lid, Meibomian gland dysfunction, Telangiectasia Dermatochalasis - lower lid, Meibomian gland dysfunction, Telangiectasia   Conjunctiva/Sclera White and quiet White and quiet   Cornea Arcus, 1-2+ Punctate epithelial erosions Arcus, Well healed cataract wounds, mild tear film debris, trace PEE   Anterior Chamber Deep and quiet Deep and quiet   Iris Round and dilated, No NVI Round and dilated, No NVI   Lens PC IOL in good position with open PC PC IOL in good position with open PC   Vitreous Vitreous syneresis Vitreous syneresis, Posterior vitreous detachment, vitreous debris       Fundus Exam      Right Left   Disc trace Pallor, Sharp rim 1-2+ Pallor, Sharp rim   C/D Ratio 0.35 0.5   Macula Flat, Blunted foveal reflex, persistent central Cystic changes, Microaneurysms with perifoveal cluster temporal to fovea -- persistent Blunted foveal reflex, central edema, scattered Microaneurysms, focal Exudates inferior macula, RPE mottling and clumping   Vessels Vascular attenuation, mild Tortuousity Vascular attenuation, mild Tortuousity   Periphery Attached, scattered IRH, operculated hole with pigment surrounding, no SRF at 0800 Attached, rare MA          IMAGING AND PROCEDURES  Imaging and Procedures for @TODAY @  OCT, Retina - OU - Both Eyes       Right Eye Quality was good. Central Foveal Thickness: 446. Progression has been stable. Findings include abnormal foveal contour, no SRF, intraretinal fluid, intraretinal hyper-reflective material (persistent IRF).   Left Eye Quality was good. Central Foveal Thickness: 456. Progression has worsened. Findings include abnormal foveal contour, intraretinal fluid, no SRF, intraretinal hyper-reflective  material, outer retinal atrophy (Mild Interval increase in IRF and centra large cyst).   Notes *Images captured and stored on drive  Diagnosis / Impression:  Persistent IRF OD Mild interval increase in IRF OS  Clinical management:  See below  Abbreviations: NFP - Normal foveal profile. CME - cystoid macular  edema. PED - pigment epithelial detachment. IRF - intraretinal fluid. SRF - subretinal fluid. EZ - ellipsoid zone. ERM - epiretinal membrane. ORA - outer retinal atrophy. ORT - outer retinal tubulation. SRHM - subretinal hyper-reflective material        Intravitreal Injection, Pharmacologic Agent - OD - Right Eye       Time Out 03/02/2019. 2:33 PM. Confirmed correct patient, procedure, site, and patient consented.   Anesthesia Topical anesthesia was used. Anesthetic medications included Lidocaine 2%, Proparacaine 0.5%.   Procedure Preparation included 5% betadine to ocular surface, eyelid speculum. A 30 gauge needle was used.   Injection:  1.25 mg Bevacizumab (AVASTIN) SOLN   NDC: 40981-191-47, Lot: [id number] , Expiration date: 05/08/2019   Route: Intravitreal, Site: Right Eye, Waste: 0 mL  Post-op Post injection exam found visual acuity of at least counting fingers. The patient tolerated the procedure well. There were no complications. The patient received written and verbal post procedure care education.        Intravitreal Injection, Pharmacologic Agent - OS - Left Eye       Time Out 03/02/2019. 2:33 PM. Confirmed correct patient, procedure, site, and patient consented.   Anesthesia Topical anesthesia was used. Anesthetic medications included Lidocaine 2%, Proparacaine 0.5%.   Procedure Preparation included 5% betadine to ocular surface, eyelid speculum. A supplied needle was used.   Injection:  1.25 mg Bevacizumab (AVASTIN) SOLN   NDC: 03/15/2019, Lot: [id number] , Expiration date: 04/25/2019   Route: Intravitreal, Site: Left Eye, Waste: 0  mL  Post-op Post injection exam found visual acuity of at least counting fingers. The patient tolerated the procedure well. There were no complications. The patient received written and verbal post procedure care education.                 ASSESSMENT/PLAN:    ICD-10-CM   1. Moderate nonproliferative diabetic retinopathy of both eyes with macular edema associated with type 2 diabetes mellitus (HCC)  [id number] Intravitreal Injection, Pharmacologic Agent - OD - Right Eye    Intravitreal Injection, Pharmacologic Agent - OS - Left Eye    Bevacizumab (AVASTIN) SOLN 1.25 mg    Bevacizumab (AVASTIN) SOLN 1.25 mg  2. Retinal edema  H35.81 OCT, Retina - OU - Both Eyes  3. Retinal hole of right eye  H33.321   4. Essential hypertension  I10   5. Hypertensive retinopathy of both eyes  H35.033   6. Pseudophakia of both eyes  Z96.1     1,2. Moderate Non-proliferative diabetic retinopathy w/ DME, OU (OS>OD)  - previously a pt of Dr. 14/09/2019 -- per Dr. X90.2409 note, s/p multiple IVA OS (x7), s/p focal laser OS  - pt has never been formally diagnosed with DM -- exam very suggestive of DM  - recommend work up / A1c check by PCP  - exam shows scattered [place] OU; no obvious NV  - OCT shows diabetic macular edema, OU (OS > OD)  - s/p focal laser OD (08.21.20)  - s/p IVA OU #1 (08.06.20), #2 (09.11.20), #3 (10.16.20)  - recommend IVA OU #4 today, 11.13.20 -- but will begin [employer] benefits investigation today  - pt wishes to proceed  - RBA of procedure discussed, questions answered  - informed consent obtained and signed  - see procedure notes  - [employer] benefits investigation started 11.13.20  - f/u in 4 wks, DFE, OCT  3. Operculated hole OD  - located at 0800, with surrounding pigment, no SRF  - s/p laser retinopexy OD (10.16.20)   -  completed PF QID OD x7 days  - f/u in 4 wks  4,5 Hypertensive retinopathy OU  - discussed importance of tight BP control and its potential contribution to macular  edema  - monitor  6. Pseudophakia OU  - s/p CE/IOL OU -- unknown cataract surgeon  - beautiful surgeries, doing well  - monitor   Ophthalmic Meds Ordered this visit:  Meds ordered this encounter  Medications  . Bevacizumab (AVASTIN) SOLN 1.25 mg  . Bevacizumab (AVASTIN) SOLN 1.25 mg       Return in about 4 weeks (around 03/30/2019) for f/u NPDR OU, DFE, OCT.  There are no Patient Instructions on file for this visit.   Explained the diagnoses, plan, and follow up with the patient and they expressed understanding.  Patient expressed understanding of the importance of proper follow up care.   This document serves as a record of services personally performed by Gardiner Sleeper, MD, PhD. It was created on their behalf by Ernest Mallick, OA, an ophthalmic assistant. The creation of this record is the provider's dictation and/or activities during the visit.    Electronically signed by: Ernest Mallick, OA 11.10.2020 10:43 PM   Gardiner Sleeper, M.D., Ph.D. Diseases & Surgery of the Retina and Vitreous [facility]  I have reviewed the above documentation for accuracy and completeness, and I agree with the above. Gardiner Sleeper, M.D., Ph.D. 03/04/19 10:43 PM    Abbreviations: M myopia (nearsighted); A astigmatism; H hyperopia (farsighted); P presbyopia; Mrx spectacle prescription;  CTL contact lenses; OD right eye; OS left eye; OU both eyes  XT exotropia; ET esotropia; PEK punctate epithelial keratitis; PEE punctate epithelial erosions; DES dry eye syndrome; MGD meibomian gland dysfunction; ATs artificial tears; PFAT's preservative free artificial tears; [place] nuclear sclerotic cataract; PSC posterior subcapsular cataract; ERM epi-retinal membrane; PVD posterior vitreous detachment; RD retinal detachment; DM diabetes mellitus; DR diabetic retinopathy; NPDR non-proliferative diabetic retinopathy; PDR proliferative diabetic retinopathy; CSME clinically significant macular  edema; DME diabetic macular edema; dbh dot blot hemorrhages; CWS cotton wool spot; POAG primary open angle glaucoma; C/D cup-to-disc ratio; HVF humphrey visual field; GVF goldmann visual field; OCT optical coherence tomography; IOP intraocular pressure; BRVO Branch retinal vein occlusion; CRVO central retinal vein occlusion; CRAO central retinal artery occlusion; BRAO branch retinal artery occlusion; RT retinal tear; SB scleral buckle; PPV pars plana vitrectomy; VH Vitreous hemorrhage; PRP panretinal laser photocoagulation; IVK intravitreal kenalog; VMT vitreomacular traction; MH Macular hole;  NVD neovascularization of the disc; NVE neovascularization elsewhere; AREDS age related eye disease study; ARMD age related macular degeneration; POAG primary open angle glaucoma; EBMD epithelial/anterior basement membrane dystrophy; ACIOL anterior chamber intraocular lens; IOL intraocular lens; PCIOL posterior chamber intraocular lens; Phaco/IOL phacoemulsification with intraocular lens placement; [place] photorefractive keratectomy; LASIK laser assisted in situ keratomileusis; HTN hypertension; DM diabetes mellitus; COPD chronic obstructive pulmonary disease

## 2019-03-02 ENCOUNTER — Ambulatory Visit (INDEPENDENT_AMBULATORY_CARE_PROVIDER_SITE_OTHER): Payer: Medicare Other | Admitting: Ophthalmology

## 2019-03-02 ENCOUNTER — Encounter (INDEPENDENT_AMBULATORY_CARE_PROVIDER_SITE_OTHER): Payer: Self-pay | Admitting: Ophthalmology

## 2019-03-02 ENCOUNTER — Other Ambulatory Visit: Payer: Self-pay

## 2019-03-02 DIAGNOSIS — H35033 Hypertensive retinopathy, bilateral: Secondary | ICD-10-CM

## 2019-03-02 DIAGNOSIS — E113313 Type 2 diabetes mellitus with moderate nonproliferative diabetic retinopathy with macular edema, bilateral: Secondary | ICD-10-CM

## 2019-03-02 DIAGNOSIS — Z961 Presence of intraocular lens: Secondary | ICD-10-CM

## 2019-03-02 DIAGNOSIS — I10 Essential (primary) hypertension: Secondary | ICD-10-CM | POA: Diagnosis not present

## 2019-03-02 DIAGNOSIS — H33321 Round hole, right eye: Secondary | ICD-10-CM

## 2019-03-02 DIAGNOSIS — H3581 Retinal edema: Secondary | ICD-10-CM | POA: Diagnosis not present

## 2019-03-04 MED ORDER — BEVACIZUMAB CHEMO INJECTION 1.25MG/0.05ML SYRINGE FOR KALEIDOSCOPE
1.2500 mg | INTRAVITREAL | Status: AC | PRN
  Administered 2019-03-04: 1.25 mg via INTRAVITREAL

## 2019-03-07 ENCOUNTER — Ambulatory Visit (INDEPENDENT_AMBULATORY_CARE_PROVIDER_SITE_OTHER): Payer: Medicare Other | Admitting: Licensed Clinical Social Worker

## 2019-03-07 DIAGNOSIS — Z85118 Personal history of other malignant neoplasm of bronchus and lung: Secondary | ICD-10-CM | POA: Diagnosis not present

## 2019-03-07 DIAGNOSIS — E785 Hyperlipidemia, unspecified: Secondary | ICD-10-CM

## 2019-03-07 DIAGNOSIS — Z85841 Personal history of malignant neoplasm of brain: Secondary | ICD-10-CM

## 2019-03-07 DIAGNOSIS — Z9181 History of falling: Secondary | ICD-10-CM

## 2019-03-07 DIAGNOSIS — M810 Age-related osteoporosis without current pathological fracture: Secondary | ICD-10-CM

## 2019-03-07 DIAGNOSIS — R413 Other amnesia: Secondary | ICD-10-CM

## 2019-03-07 NOTE — Chronic Care Management (AMB) (Signed)
  Care Management Note   Jamie [name] is a 68 y.o. year old female who is a primary care patient of Jamie Sleeper, PA-C. The CM team was consulted for assistance with chronic disease management and care coordination.   I reached out to Jamie [name] by phone today.   Review of patient status, including review of consultants reports, relevant laboratory and other test results, and collaboration with appropriate care team members and the patient's provider was performed as part of comprehensive patient evaluation and provision of chronic care management services.   Social determinants of health: risk of social isolation; risk of tobacco use; risk of physical inactivity    Chronic Care Management from 07/17/2018 in [place]  PHQ-9 Total Score  4     Medications    donepezil (ARICEPT) 5 MG tablet    folic acid (FOLVITE) 1 MG tablet    Ibuprofen-diphenhydrAMINE Cit (ADVIL PM PO)    Multiple Vitamin (MULTIVITAMIN) tablet    rosuvastatin (CRESTOR) 10 MG tablet    Goals    . Client said she wants help in improving her living situation and wants to look for other housing options in area. (pt-stated)     Current Barriers:  . Financial constraints . Limited social support  Clinical Social Work Clinical Goal(s):  Marland Kitchen Over next 30 days, client will work with LCSW to address concerns related to housing issues of client and discuss housing options for client in the area.   Interventions: . Encouraged client to call LCSW to discuss psychosocial needs of client . Talked with client about client safety status . Encouraged client to use relaxation techniques of choice to help her manage stress issues (watch TV,visit with family members,listening to music) . Talked with client about self care activities of choice  Talked with client about support from her son, Jamie [name]  Patient Self Care Activities:  . Attends all scheduled provider appointments  . Performs ADL's  independently . Contacts Jamie [name], caregiver to discuss client needs  Plan:  . Patient will contact LCSW to discuss psychosical needs of client   . LCSW will call client in three weeks to assess client housing needs . Patient will communicate with RN CM to discuss nursing needs of client . Client to call 911 for emergency assistance as needed.  . Client to attend scheduled client medical appointments  Initial goal documentation      Follow Up Plan: LCSW will call client in next 3 weeks to access client housing needs at that time  Norva Riffle.Jamie [name] MSW, LCSW Licensed Clinical Social Worker [facility] [phone]

## 2019-03-07 NOTE — Progress Notes (Signed)
I have reviewed the CCM documentation and agree with the written assessment and plan of care.  Terald Sleeper PA-C [facility] [address]  [place], [place] [address] [phone]

## 2019-03-07 NOTE — Patient Instructions (Addendum)
Licensed Clinical Social Worker Visit Information  Goals we discussed today:  Goals        . Client said she wants help in improving her living situation and wants to look for other housing options in area. (pt-stated)     Current Barriers:  . Financial constraints . Limited social support  Clinical Social Work Clinical Goal(s):  Marland Kitchen Over next 30 days, client will work with LCSW to address concerns related to housing issues of client and discuss housing options for client in the area.   Interventions:  Encouraged client to call LCSW to discuss psychosocial needs of client  Talked with client about client safety status  Encouraged client to use relaxation techniques of choice to help her manage stress issues (watch TV,visit with family members,listening to music)  Talked with client about self care activities of choice  Talked with client about support from her son, [name]  Patient Self Care Activities:  . Attends all scheduled provider appointments  . Performs ADL's independently . Contacts [name], caregiver to discuss client needs  Plan:  . Patient will contact LCSW to discuss psychosical needs of client   . LCSW will call client in three weeks to assess client housing needs . Patient will communicate with RN CM to discuss nursing needs of client . Client to call 911 for emergency assistance as needed.  . Client to attend scheduled client medical appointments  Initial goal documentation            Materials Provided: No  Follow Up Plan:  LCSW to call client in next 3 weeks to assess the housing needs of client at that time  The patient verbalized understanding of instructions provided today and declined a print copy of patient instruction materials.   Norva Riffle.Jaymien Landin MSW, LCSW Licensed Clinical Social Worker [facility] [phone]

## 2019-03-29 ENCOUNTER — Ambulatory Visit (INDEPENDENT_AMBULATORY_CARE_PROVIDER_SITE_OTHER): Payer: Medicare Other | Admitting: Licensed Clinical Social Worker

## 2019-03-29 DIAGNOSIS — E785 Hyperlipidemia, unspecified: Secondary | ICD-10-CM

## 2019-03-29 DIAGNOSIS — M810 Age-related osteoporosis without current pathological fracture: Secondary | ICD-10-CM

## 2019-03-29 DIAGNOSIS — Z85118 Personal history of other malignant neoplasm of bronchus and lung: Secondary | ICD-10-CM | POA: Diagnosis not present

## 2019-03-29 DIAGNOSIS — R413 Other amnesia: Secondary | ICD-10-CM

## 2019-03-29 DIAGNOSIS — Z85841 Personal history of malignant neoplasm of brain: Secondary | ICD-10-CM

## 2019-03-29 NOTE — Patient Instructions (Addendum)
Licensed Clinical Social Worker Visit Information  Goals we discussed today:  Goals Addressed            This Visit's Progress   . Client said she wants help in improving her living situation and wants to look for other housing options in area. (pt-stated)       Current Barriers:  . Financial constraints of client with chronic diagnoses of CKD, Osteoporosis, Hx Cancer of Lung, Hx of Cancer of the brain, Hyperlipidemia, and Memory impairment . Limited social support  Clinical Social Work Clinical Goal(s):  Marland Kitchen Over next 30 days, client will work with LCSW to address concerns related to housing issues of client and discuss housing options for client in the area.   Interventions: . Encouraged client to call LCSW to discuss psychosocial needs of client . Talked with client about client safety status . Encouraged client to use relaxation techniques of choice to help her manage stress issues . Talked with client about self care activities of choice  Talked with client about support from her son, [name]  Talked with client about client upcoming appointments  Patient Self Care Activities:  . Attends all scheduled provider appointments  . Performs ADL's independently . Contacts [name], caregiver to discuss client needs  Plan:  . Patient will contact LCSW to discuss psychosical needs of client   . LCSW will call client in four weeks to assess client housing needs . Patient will communicate with RN CM to discuss nursing needs of client . Client to call 911 for emergency assistance as needed.  . Client to attend scheduled client medical appointments  Initial goal documentation        Materials Provided: No  Follow Up Plan: LCSW to call client in the next 4 weeks to assess the housing needs of client at that time  The patient verbalized understanding of instructions provided today and declined a print copy of patient instruction materials.   Norva Riffle.Ardie Dragoo MSW,  LCSW Licensed Clinical Social Worker [facility] [phone]

## 2019-03-29 NOTE — Chronic Care Management (AMB) (Signed)
  Care Management Note   Jamie [name] is a 68 y.o. year old female who is a primary care patient of Jamie Sleeper, PA-C. The CM team was consulted for assistance with chronic disease management and care coordination.   I reached out to Jamie [name] by phone today.   Review of patient status, including review of consultants reports, relevant laboratory and other test results, and collaboration with appropriate care team members and the patient's provider was performed as part of comprehensive patient evaluation and provision of chronic care management services.   Social determinants of health: risk of social isolation; risk of tobacco use; risk of physical inactivity    Chronic Care Management from 07/17/2018 in [place]  PHQ-9 Total Score  4     Medications   donepezil (ARICEPT) 5 MG tablet folic acid (FOLVITE) 1 MG tablet Ibuprofen-diphenhydrAMINE Cit (ADVIL PM PO) Multiple Vitamin (MULTIVITAMIN) tablet rosuvastatin (CRESTOR) 10 MG tablet  Goals Addressed            This Visit's Progress   . Client said she wants help in improving her living situation and wants to look for other housing options in area. (pt-stated)       Current Barriers:  . Financial constraints of client with chronic diagnoses of CKD, Osteoporosis, Hx Cancer of Lung, Hx of Cancer of the brain, Hyperlipidemia, and Memory impairment . Limited social support  Clinical Social Work Clinical Goal(s):  Jamie [name] Kitchen Over next 30 days, client will work with LCSW to address concerns related to housing issues of client and discuss housing options for client in the area.   Interventions: . Encouraged client to use relaxation techniques of choice to help her manage stress issues . Talked with client about self care activities of choice  Encouraged client to call LCSW to discuss psychosocial needs of client  Talked with client about client safety status  Talked with client about support from her  son, Jamie [name]  Talked with client about client upcoming appointments  Patient Self Care Activities:  . Attends all scheduled provider appointments  . Performs ADL's independently . Contacts Jamie [name], caregiver to discuss client needs  Plan:  . Patient will contact LCSW to discuss psychosical needs of client   . LCSW will call client in four weeks to assess client housing needs . Patient will communicate with RN CM to discuss nursing needs of client . Client to call 911 for emergency assistance as needed.  . Client to attend scheduled client medical appointments  Initial goal documentation      Follow Up Plan: LCSW to call client in next 4 weeks to assess the housing needs of client at that time  Jamie [name].Jamie [name] MSW, LCSW Licensed Clinical Social Worker [facility] [phone]

## 2019-03-30 ENCOUNTER — Encounter (INDEPENDENT_AMBULATORY_CARE_PROVIDER_SITE_OTHER): Payer: Medicare Other | Admitting: Ophthalmology

## 2019-04-02 NOTE — Progress Notes (Addendum)
[facility] Note  04/06/2019     CHIEF COMPLAINT Patient presents for Retina Follow Up   HISTORY OF PRESENT ILLNESS: Jamie [name] is a 68 y.o. female who presents to the clinic today for:   HPI    Retina Follow Up    Patient presents with  Diabetic Retinopathy.  In both eyes.  This started weeks ago.  Severity is moderate.  Duration of weeks.  Since onset it is stable.  I, the attending physician,  performed the HPI with the patient and updated documentation appropriately.          Comments    Pt states some days her vision is good and some days vision is bad.  Patient denies eye pain or discomfort and denies any new or worsening floaters or fol OU.       Last edited by Bernarda Caffey, MD on 04/06/2019  3:19 PM. (History)      Referring physician: Terald Sleeper, PA-C [place],  [place] [address]  HISTORICAL INFORMATION:   Selected notes from the MEDICAL RECORD NUMBER Referred by Dr. Melina Schools for concern of retinal heme  LEE: 02.17.20 Ricci Barker) [BCVA: OD: 20/25- OS: 20/80+]  Ocular Hx-CSME, BRVO, macular cyst, optic neuropathy, DES, pseudo OU, s/p IVA (7 total, 2008, Dr. Zadie Rhine), s/p focal laser (2008), s/p YAG (2011) PMH-brain tumor, cancer, HLD, lung cancer   CURRENT MEDICATIONS: No current outpatient medications on file. (Ophthalmic Drugs)   No current facility-administered medications for this visit. (Ophthalmic Drugs)   Current Outpatient Medications (Other)  Medication Sig  . donepezil (ARICEPT) 5 MG tablet Take 1 tablet (5 mg total) by mouth at bedtime.  . folic acid (FOLVITE) 1 MG tablet Take 1 tablet (1 mg total) by mouth daily.  . Ibuprofen-diphenhydrAMINE Cit (ADVIL PM PO) Take by mouth at bedtime.  . Multiple Vitamin (MULTIVITAMIN) tablet Take 1 tablet by mouth daily.  . rosuvastatin (CRESTOR) 10 MG tablet Take 1 tablet (10 mg total) by mouth at bedtime.   No current facility-administered medications for this  visit. (Other)      REVIEW OF SYSTEMS: ROS    Positive for: Gastrointestinal, Musculoskeletal, Endocrine, Eyes, Psychiatric   Negative for: Constitutional, Neurological, Skin, Genitourinary, HENT, Cardiovascular, Respiratory, Allergic/Imm, Heme/Lymph   Last edited by Doneen Poisson on 04/06/2019  2:30 PM. (History)       ALLERGIES Allergies  Allergen Reactions  . Fosamax [Alendronate Sodium] Other (See Comments)    "unknown"  . Sulfonamide Derivatives     REACTION: Hives, tongue swells    PAST MEDICAL HISTORY Past Medical History:  Diagnosis Date  . Brain cancer ([place])   . Cancer ([place])   . CKD (chronic kidney disease), stage III   . Dementia ([place])   . Diabetic retinopathy ([place])    NPDR OU  . High cholesterol   . Hypertensive retinopathy    OU  . Insomnia   . Lung cancer ([place])   . Osteoporosis    Past Surgical History:  Procedure Laterality Date  . ABDOMINAL HYSTERECTOMY  1986  . BRAIN SURGERY  2001   TUMOR REMOVAL  . catarac Bilateral   . CATARACT EXTRACTION Bilateral   . EYE SURGERY    . LOBECTOMY      FAMILY HISTORY Family History  Problem Relation Age of Onset  . Cancer Mother        cirrhois of liver  . Heart attack Father   .  Colon cancer Father   . Asthma Brother     SOCIAL HISTORY Social History   Tobacco Use  . Smoking status: Former Smoker    Years: 33.00    Types: Cigarettes    Quit date: 04/20/1999    Years since quitting: 19.9  . Smokeless tobacco: Never Used  Substance Use Topics  . Alcohol use: Not Currently  . Drug use: No         OPHTHALMIC EXAM:  Base Eye Exam    Visual Acuity (Snellen - Linear)      Right Left   Dist [place] 20/40 -1 20/80 +1   Dist ph [place] NI NI       Tonometry (Tonopen, 2:36 PM)      Right Left   Pressure 16 18       Pupils      Dark Light Shape React APD   Right 3 2 Round Minimal 0   Left 3 2 Round Minimal 0       Visual Fields      Left Right    Full Full       Extraocular Movement       Right Left    Full Full       Neuro/Psych    Oriented x3: Yes   Mood/Affect: Normal       Dilation    Both eyes: 1.0% Mydriacyl, 2.5% Phenylephrine @ 2:36 PM        Slit Lamp and Fundus Exam    Slit Lamp Exam      Right Left   Lids/Lashes Dermatochalasis - lower lid, Meibomian gland dysfunction, Telangiectasia Dermatochalasis - lower lid, Meibomian gland dysfunction, Telangiectasia   Conjunctiva/Sclera White and quiet White and quiet   Cornea Arcus, 1-2+ Punctate epithelial erosions Arcus, Well healed cataract wounds, mild tear film debris, trace PEE   Anterior Chamber Deep and quiet Deep and quiet   Iris Round and dilated, No NVI Round and dilated, No NVI   Lens PC IOL in good position with open PC PC IOL in good position with open PC   Vitreous Vitreous syneresis Vitreous syneresis, Posterior vitreous detachment, vitreous debris       Fundus Exam      Right Left   Disc trace Pallor, Sharp rim 1-2+ Pallor, Sharp rim   C/D Ratio 0.35 0.5   Macula Flat, Blunted foveal reflex, persistent central Cystic changes, Microaneurysms with perifoveal cluster temporal to fovea -- persistent Blunted foveal reflex, central edema, scattered Microaneurysms, focal Exudates inferior macula, RPE mottling and clumping, persistent large central cyst   Vessels Vascular attenuation, mild Tortuousity Vascular attenuation, mild Tortuousity   Periphery Attached, scattered IRH, operculated hole with pigment surrounding, no SRF at 0800 Attached, rare MA          IMAGING AND PROCEDURES  Imaging and Procedures for @TODAY @  OCT, Retina - OU - Both Eyes       Right Eye Quality was good. Central Foveal Thickness: 434. Progression has been stable. Findings include abnormal foveal contour, no SRF, intraretinal fluid, intraretinal hyper-reflective material (persistent IRF).   Left Eye Quality was good. Central Foveal Thickness: 426. Progression has been stable. Findings include abnormal foveal  contour, intraretinal fluid, no SRF, intraretinal hyper-reflective material, outer retinal atrophy (Mild Interval increase in IRF and centra large cyst).   Notes *Images captured and stored on drive  Diagnosis / Impression:  Persistent IRF OU -- still minimal improvement  Clinical management:  See below  Abbreviations:  NFP - Normal foveal profile. CME - cystoid macular edema. PED - pigment epithelial detachment. IRF - intraretinal fluid. SRF - subretinal fluid. EZ - ellipsoid zone. ERM - epiretinal membrane. ORA - outer retinal atrophy. ORT - outer retinal tubulation. SRHM - subretinal hyper-reflective material        Intravitreal Injection, Pharmacologic Agent - OD - Right Eye       Time Out 04/06/2019. 2:33 PM. Confirmed correct patient, procedure, site, and patient consented.   Anesthesia Topical anesthesia was used. Anesthetic medications included Lidocaine 2%, Proparacaine 0.5%.   Procedure Preparation included 5% betadine to ocular surface, eyelid speculum. A supplied (32 g) needle was used.   Injection:  2 mg aflibercept Alfonse Flavors) SOLN   NDC: A3590391, Lot: [id number], Expiration date: 10/14/2019   Route: Intravitreal, Site: Right Eye, Waste: 0.05 mL  Post-op Post injection exam found visual acuity of at least counting fingers. The patient tolerated the procedure well. There were no complications. The patient received written and verbal post procedure care education.        Intravitreal Injection, Pharmacologic Agent - OS - Left Eye       Time Out 04/06/2019. 2:34 PM. Confirmed correct patient, procedure, site, and patient consented.   Anesthesia Topical anesthesia was used. Anesthetic medications included Lidocaine 2%, Proparacaine 0.5%.   Procedure Preparation included 5% betadine to ocular surface, eyelid speculum. A supplied (32 g) needle was used.   Injection:  2 mg aflibercept Alfonse Flavors) SOLN   NDC: M7179715, Lot: [id number], Expiration date:  10/14/2019   Route: Intravitreal, Site: Left Eye, Waste: 0.05 mL  Post-op Post injection exam found visual acuity of at least counting fingers. The patient tolerated the procedure well. There were no complications. The patient received written and verbal post procedure care education.                 ASSESSMENT/PLAN:    ICD-10-CM   1. Moderate nonproliferative diabetic retinopathy of both eyes with macular edema associated with type 2 diabetes mellitus (HCC)  D98.3382 Intravitreal Injection, Pharmacologic Agent - OD - Right Eye    Intravitreal Injection, Pharmacologic Agent - OS - Left Eye    aflibercept (EYLEA) SOLN 2 mg    aflibercept (EYLEA) SOLN 2 mg  2. Retinal edema  H35.81 OCT, Retina - OU - Both Eyes  3. Retinal hole of right eye  H33.321   4. Essential hypertension  I10   5. Hypertensive retinopathy of both eyes  H35.033   6. Pseudophakia of both eyes  Z96.1     1,2. Moderate Non-proliferative diabetic retinopathy w/ DME, OU (OS>OD)  - previously a pt of Dr. Zadie Rhine -- per Dr. Rona Ravens note, s/p multiple IVA OS (x7), s/p focal laser OS  - pt has never been formally diagnosed with DM -- exam very suggestive of DM  - recommend work up / A1c check by PCP  - exam shows scattered [place] OU; no obvious NV  - OCT shows persistent diabetic macular edema, OU (OS > OD)  - s/p focal laser OD (08.21.20)  - s/p IVA OU #1 (08.06.20), #2 (09.11.20), #3 (10.16.20), #4 (11.13.20)  - no significant improvement s/p IVA OU x4 -- ?resistance  - discussed possibility of switching medication  - recommend IVE OU #1 today, 12.18.20   - pt wishes to proceed  - RBA of procedure discussed, questions answered  - informed consent obtained and signed  - see procedure notes  - Eylea4U benefits investigation started 11.13.20 --approved as of 12.18.20  -  f/u in 4 wks, DFE, OCT, possible injection  3. Operculated hole OD  - located at 0800, with surrounding pigment, no SRF  - s/p laser retinopexy OD  (10.16.20)   - completed PF QID OD x7 days  - f/u in 4 wks  4,5 Hypertensive retinopathy OU  - discussed importance of tight BP control and its potential contribution to macular edema  - monitor  6. Pseudophakia OU  - s/p CE/IOL OU -- unknown cataract surgeon  - beautiful surgeries, doing well  - monitor   Ophthalmic Meds Ordered this visit:  Meds ordered this encounter  Medications  . aflibercept (EYLEA) SOLN 2 mg  . aflibercept (EYLEA) SOLN 2 mg       Return in about 4 weeks (around 05/04/2019) for f/u NPDR OU, DFE, OCT.  There are no Patient Instructions on file for this visit.   Explained the diagnoses, plan, and follow up with the patient and they expressed understanding.  Patient expressed understanding of the importance of proper follow up care.   This document serves as a record of services personally performed by Gardiner Sleeper, MD, PhD. It was created on their behalf by Ernest Mallick, OA, an ophthalmic assistant. The creation of this record is the provider's dictation and/or activities during the visit.    Electronically signed by: Ernest Mallick, OA 12.14.2020 5:05 PM   Gardiner Sleeper, M.D., Ph.D. Diseases & Surgery of the Retina and Vitreous [facility]  I have reviewed the above documentation for accuracy and completeness, and I agree with the above. Gardiner Sleeper, M.D., Ph.D. 04/09/19 5:05 PM   Abbreviations: M myopia (nearsighted); A astigmatism; H hyperopia (farsighted); P presbyopia; Mrx spectacle prescription;  CTL contact lenses; OD right eye; OS left eye; OU both eyes  XT exotropia; ET esotropia; PEK punctate epithelial keratitis; PEE punctate epithelial erosions; DES dry eye syndrome; MGD meibomian gland dysfunction; ATs artificial tears; PFAT's preservative free artificial tears; [place] nuclear sclerotic cataract; PSC posterior subcapsular cataract; ERM epi-retinal membrane; PVD posterior vitreous detachment; RD retinal detachment;  DM diabetes mellitus; DR diabetic retinopathy; NPDR non-proliferative diabetic retinopathy; PDR proliferative diabetic retinopathy; CSME clinically significant macular edema; DME diabetic macular edema; dbh dot blot hemorrhages; CWS cotton wool spot; POAG primary open angle glaucoma; C/D cup-to-disc ratio; HVF humphrey visual field; GVF goldmann visual field; OCT optical coherence tomography; IOP intraocular pressure; BRVO Branch retinal vein occlusion; CRVO central retinal vein occlusion; CRAO central retinal artery occlusion; BRAO branch retinal artery occlusion; RT retinal tear; SB scleral buckle; PPV pars plana vitrectomy; VH Vitreous hemorrhage; PRP panretinal laser photocoagulation; IVK intravitreal kenalog; VMT vitreomacular traction; MH Macular hole;  NVD neovascularization of the disc; NVE neovascularization elsewhere; AREDS age related eye disease study; ARMD age related macular degeneration; POAG primary open angle glaucoma; EBMD epithelial/anterior basement membrane dystrophy; ACIOL anterior chamber intraocular lens; IOL intraocular lens; PCIOL posterior chamber intraocular lens; Phaco/IOL phacoemulsification with intraocular lens placement; [place] photorefractive keratectomy; LASIK laser assisted in situ keratomileusis; HTN hypertension; DM diabetes mellitus; COPD chronic obstructive pulmonary disease

## 2019-04-06 ENCOUNTER — Other Ambulatory Visit: Payer: Self-pay

## 2019-04-06 ENCOUNTER — Ambulatory Visit (INDEPENDENT_AMBULATORY_CARE_PROVIDER_SITE_OTHER): Payer: Medicare Other | Admitting: Ophthalmology

## 2019-04-06 DIAGNOSIS — H33321 Round hole, right eye: Secondary | ICD-10-CM | POA: Diagnosis not present

## 2019-04-06 DIAGNOSIS — H35033 Hypertensive retinopathy, bilateral: Secondary | ICD-10-CM | POA: Diagnosis not present

## 2019-04-06 DIAGNOSIS — E113313 Type 2 diabetes mellitus with moderate nonproliferative diabetic retinopathy with macular edema, bilateral: Secondary | ICD-10-CM | POA: Diagnosis not present

## 2019-04-06 DIAGNOSIS — Z961 Presence of intraocular lens: Secondary | ICD-10-CM

## 2019-04-06 DIAGNOSIS — I10 Essential (primary) hypertension: Secondary | ICD-10-CM | POA: Diagnosis not present

## 2019-04-06 DIAGNOSIS — H3581 Retinal edema: Secondary | ICD-10-CM

## 2019-04-07 ENCOUNTER — Encounter (INDEPENDENT_AMBULATORY_CARE_PROVIDER_SITE_OTHER): Payer: Self-pay | Admitting: Ophthalmology

## 2019-04-09 DIAGNOSIS — E113313 Type 2 diabetes mellitus with moderate nonproliferative diabetic retinopathy with macular edema, bilateral: Secondary | ICD-10-CM | POA: Diagnosis not present

## 2019-04-09 DIAGNOSIS — H35033 Hypertensive retinopathy, bilateral: Secondary | ICD-10-CM | POA: Diagnosis not present

## 2019-04-09 MED ORDER — AFLIBERCEPT 2MG/0.05ML IZ SOLN FOR KALEIDOSCOPE
2.0000 mg | INTRAVITREAL | Status: AC | PRN
  Administered 2019-04-09: 2 mg via INTRAVITREAL

## 2019-04-25 ENCOUNTER — Ambulatory Visit (INDEPENDENT_AMBULATORY_CARE_PROVIDER_SITE_OTHER): Payer: Medicare Other | Admitting: Licensed Clinical Social Worker

## 2019-04-25 DIAGNOSIS — E785 Hyperlipidemia, unspecified: Secondary | ICD-10-CM

## 2019-04-25 DIAGNOSIS — M810 Age-related osteoporosis without current pathological fracture: Secondary | ICD-10-CM

## 2019-04-25 DIAGNOSIS — Z85118 Personal history of other malignant neoplasm of bronchus and lung: Secondary | ICD-10-CM | POA: Diagnosis not present

## 2019-04-25 DIAGNOSIS — R413 Other amnesia: Secondary | ICD-10-CM

## 2019-04-25 DIAGNOSIS — Z85841 Personal history of malignant neoplasm of brain: Secondary | ICD-10-CM

## 2019-04-25 NOTE — Patient Instructions (Addendum)
Licensed Clinical Social Worker Visit Information  Goals we discussed today:  Goals Addressed            This Visit's Progress   . Client said she wants help in improving her living situation and wants to look for other housing options in area. (pt-stated)       Current Barriers:  . Financial constraints of client with chronic diagnoses of CKD, Osteoporosis, Hx Cancer of Lung, Hx of Cancer of the brain, Hyperlipidemia, and Memory impairment . Limited social support  Clinical Social Work Clinical Goal(s):  Marland Kitchen Over next 30 days, client will work with LCSW to address concerns related to housing issues of client and discuss housing options for client in the area.   Interventions:   Encouraged client to call LCSW to discuss psychosocial needs of client  Talked with client about client safety status  Encouraged client to use relaxation techniques of choice to help her manage stress issues (watches TV, listens to music, take a walk outdoors)  Talked with client about self care activities of choice  Talked with client about her upcoming client appointments  Talked with client about her transport needs  Talked with client about ambulation needs of client  Patient Self Care Activities:  . Attends all scheduled provider appointments  . Performs ADL's independently . Contacts [name], caregiver to discuss client needs  Plan:  . Patient will contact LCSW to discuss psychosical needs of client  . LCSW will call client in next 4  weeks to assess client housing needs . Patient will communicate with RN CM to discuss nursing needs of client . Client to call 911 for emergency assistance as needed.  . Client to attend scheduled client medical appointments  Initial goal documentation        Materials Provided: No  Follow Up Plan: LCSW to call client in next 4 weeks to assess the housing needs of client at that time  The patient verbalized understanding of instructions provided  today and declined a print copy of patient instruction materials.   Norva Riffle.Jawaan Adachi MSW, LCSW Licensed Clinical Social Worker [facility] [phone]

## 2019-04-25 NOTE — Chronic Care Management (AMB) (Signed)
  Care Management Note   Jamie [name] is a 69 y.o. year old female who is a primary care patient of Terald Sleeper, PA-C. The CM team was consulted for assistance with chronic disease management and care coordination.   I reached out to [name] by phone today.    Review of patient status, including review of consultants reports, relevant laboratory and other test results, and collaboration with appropriate care team members and the patient's provider was performed as part of comprehensive patient evaluation and provision of chronic care management services.   Social determinants of health: risk of social isolation; risk of tobacco use; risk of physical inactivity    Chronic Care Management from 07/17/2018 in [facility]  PHQ-9 Total Score  4     Medications   donepezil (ARICEPT) 5 MG tablet folic acid (FOLVITE) 1 MG tablet Ibuprofen-diphenhydrAMINE Cit (ADVIL PM PO) Multiple Vitamin (MULTIVITAMIN) tablet rosuvastatin (CRESTOR) 10 MG tablet  Goals Addressed            This Visit's Progress   . Client said she wants help in improving her living situation and wants to look for other housing options in area. (pt-stated)       Current Barriers:  . Financial constraints of client with chronic diagnoses of CKD, Osteoporosis, Hx Cancer of Lung, Hx of Cancer of the brain, Hyperlipidemia, and Memory impairment . Limited social support  Clinical Social Work Clinical Goal(s):  Marland Kitchen Over next 30 days, client will work with LCSW to address concerns related to housing issues of client and discuss housing options for client in the area.   Interventions: . Encouraged client to call LCSW to discuss psychosocial needs of client . Talked with client about client safety status . Encouraged client to use relaxation techniques of choice to help her manage stress issues (watches TV, listens to music, take a walk outdoors) . Talked with client about self care activities of  choice . Talked with client about her upcoming client appointments . Talked with client about her transport needs . Talked with client about ambulation needs of client  Patient Self Care Activities:  . Attends all scheduled provider appointments  . Performs ADL's independently . Contacts [name], caregiver to discuss client needs  Plan:  . Patient will contact LCSW to discuss psychosical needs of client  . LCSW will call client in next 4 weeks to assess client housing needs . Patient will communicate with RN CM to discuss nursing needs of client . Client to call 911 for emergency assistance as needed.  . Client to attend scheduled client medical appointments  Initial goal documentation      Follow Up Plan: LCSW to call client in the next 4 weeks to assess the housing needs of client at that time  Norva Riffle.Odyssey Vasbinder MSW, LCSW Licensed Clinical Social Worker [facility] [phone]

## 2019-05-04 ENCOUNTER — Encounter (INDEPENDENT_AMBULATORY_CARE_PROVIDER_SITE_OTHER): Payer: Medicare Other | Admitting: Ophthalmology

## 2019-05-14 ENCOUNTER — Encounter (INDEPENDENT_AMBULATORY_CARE_PROVIDER_SITE_OTHER): Payer: Medicare Other | Admitting: Ophthalmology

## 2019-05-15 ENCOUNTER — Encounter (INDEPENDENT_AMBULATORY_CARE_PROVIDER_SITE_OTHER): Payer: Medicare Other | Admitting: Ophthalmology

## 2019-05-16 ENCOUNTER — Encounter (INDEPENDENT_AMBULATORY_CARE_PROVIDER_SITE_OTHER): Payer: Self-pay | Admitting: Ophthalmology

## 2019-05-16 ENCOUNTER — Ambulatory Visit (INDEPENDENT_AMBULATORY_CARE_PROVIDER_SITE_OTHER): Payer: Medicare Other | Admitting: Ophthalmology

## 2019-05-16 DIAGNOSIS — H3581 Retinal edema: Secondary | ICD-10-CM | POA: Diagnosis not present

## 2019-05-16 DIAGNOSIS — I10 Essential (primary) hypertension: Secondary | ICD-10-CM

## 2019-05-16 DIAGNOSIS — H33321 Round hole, right eye: Secondary | ICD-10-CM

## 2019-05-16 DIAGNOSIS — H35033 Hypertensive retinopathy, bilateral: Secondary | ICD-10-CM

## 2019-05-16 DIAGNOSIS — E113313 Type 2 diabetes mellitus with moderate nonproliferative diabetic retinopathy with macular edema, bilateral: Secondary | ICD-10-CM

## 2019-05-16 DIAGNOSIS — Z961 Presence of intraocular lens: Secondary | ICD-10-CM

## 2019-05-16 MED ORDER — AFLIBERCEPT 2MG/0.05ML IZ SOLN FOR KALEIDOSCOPE
2.0000 mg | INTRAVITREAL | Status: AC | PRN
  Administered 2019-05-16: 2 mg via INTRAVITREAL

## 2019-05-16 MED ORDER — BEVACIZUMAB CHEMO INJECTION 1.25MG/0.05ML SYRINGE FOR KALEIDOSCOPE
1.2500 mg | INTRAVITREAL | Status: AC | PRN
  Administered 2019-05-16: 1.25 mg via INTRAVITREAL

## 2019-05-16 NOTE — Progress Notes (Signed)
[facility] Note  05/16/2019     CHIEF COMPLAINT Patient presents for Retina Follow Up   HISTORY OF PRESENT ILLNESS: Jamie [name] is a 69 y.o. female who presents to the clinic today for:   HPI    Retina Follow Up    Patient presents with  Diabetic Retinopathy.  In both eyes.  This started weeks ago.  Severity is moderate.  Duration of weeks.  Since onset it is stable.  I, the attending physician,  performed the HPI with the patient and updated documentation appropriately.          Comments    Pt states her vision has been pretty good OU.  Patient denies eye pain or discomfort and denies any new or worsening floaters or fol OU.       Last edited by Bernarda Caffey, MD on 05/16/2019 12:34 PM. (History)     Patient states feels like vision is improving OU. Patient late to follow-up--6 weeks instead of 4 weeks   Referring physician: Terald Sleeper, PA-C [place],  [place] [address]  HISTORICAL INFORMATION:   Selected notes from the MEDICAL RECORD NUMBER Referred by Dr. Melina Schools for concern of retinal heme  LEE: 02.17.20 Ricci Barker) [BCVA: OD: 20/25- OS: 20/80+]  Ocular Hx-CSME, BRVO, macular cyst, optic neuropathy, DES, pseudo OU, s/p IVA (7 total, 2008, Dr. Zadie Rhine), s/p focal laser (2008), s/p YAG (2011) PMH-brain tumor, cancer, HLD, lung cancer   CURRENT MEDICATIONS: No current outpatient medications on file. (Ophthalmic Drugs)   No current facility-administered medications for this visit. (Ophthalmic Drugs)   Current Outpatient Medications (Other)  Medication Sig  . donepezil (ARICEPT) 5 MG tablet Take 1 tablet (5 mg total) by mouth at bedtime.  . folic acid (FOLVITE) 1 MG tablet Take 1 tablet (1 mg total) by mouth daily.  . Ibuprofen-diphenhydrAMINE Cit (ADVIL PM PO) Take by mouth at bedtime.  . Multiple Vitamin (MULTIVITAMIN) tablet Take 1 tablet by mouth daily.  . rosuvastatin (CRESTOR) 10 MG tablet Take 1 tablet (10 mg total)  by mouth at bedtime.   No current facility-administered medications for this visit. (Other)      REVIEW OF SYSTEMS: ROS    Positive for: Gastrointestinal, Musculoskeletal, Endocrine, Eyes, Psychiatric   Negative for: Constitutional, Neurological, Skin, Genitourinary, HENT, Cardiovascular, Respiratory, Allergic/Imm, Heme/Lymph   Last edited by Doneen Poisson on 05/16/2019  9:45 AM. (History)       ALLERGIES Allergies  Allergen Reactions  . Fosamax [Alendronate Sodium] Other (See Comments)    "unknown"  . Sulfonamide Derivatives     REACTION: Hives, tongue swells    PAST MEDICAL HISTORY Past Medical History:  Diagnosis Date  . Brain cancer ([place])   . Cancer ([place])   . CKD (chronic kidney disease), stage III   . Dementia ([place])   . Diabetic retinopathy ([place])    NPDR OU  . High cholesterol   . Hypertensive retinopathy    OU  . Insomnia   . Lung cancer ([place])   . Osteoporosis    Past Surgical History:  Procedure Laterality Date  . ABDOMINAL HYSTERECTOMY  1986  . BRAIN SURGERY  2001   TUMOR REMOVAL  . catarac Bilateral   . CATARACT EXTRACTION Bilateral   . EYE SURGERY    . LOBECTOMY      FAMILY HISTORY Family History  Problem Relation Age of Onset  . Cancer Mother  cirrhois of liver  . Heart attack Father   . Colon cancer Father   . Asthma Brother     SOCIAL HISTORY Social History   Tobacco Use  . Smoking status: Former Smoker    Years: 33.00    Types: Cigarettes    Quit date: 04/20/1999    Years since quitting: 20.0  . Smokeless tobacco: Never Used  Substance Use Topics  . Alcohol use: Not Currently  . Drug use: No         OPHTHALMIC EXAM:  Base Eye Exam    Visual Acuity (Snellen - Linear)      Right Left   Dist  20/40 -1 20/80   Dist ph  NI 20/80 +2       Tonometry (Tonopen, 9:48 AM)      Right Left   Pressure 15 17       Pupils      Dark Light Shape React APD   Right 4 3 Round Brisk 0   Left 4 3 Round Brisk 0        Visual Fields      Left Right    Full Full       Extraocular Movement      Right Left    Full Full       Neuro/Psych    Oriented x3: Yes   Mood/Affect: Normal       Dilation    Both eyes: 1.0% Mydriacyl, 2.5% Phenylephrine @ 9:48 AM        Slit Lamp and Fundus Exam    Slit Lamp Exam      Right Left   Lids/Lashes Dermatochalasis - lower lid, Meibomian gland dysfunction, Telangiectasia Dermatochalasis - lower lid, Meibomian gland dysfunction, Telangiectasia   Conjunctiva/Sclera White and quiet White and quiet   Cornea Arcus, 1-2+ Punctate epithelial erosions Arcus, Well healed cataract wounds, mild tear film debris, trace PEE   Anterior Chamber Deep and quiet Deep and quiet   Iris Round and dilated, No NVI Round and dilated, No NVI   Lens PC IOL in good position with open PC PC IOL in good position with open PC   Vitreous Vitreous syneresis Vitreous syneresis, Posterior vitreous detachment, vitreous debris       Fundus Exam      Right Left   Disc trace Pallor, Sharp rim 1-2+ Pallor, Sharp rim   C/D Ratio 0.35 0.5   Macula Flat, Blunted foveal reflex, persistent central Cystic changes improved, Microaneurysms with perifoveal cluster temporal to fovea -- persistent but slightly improved Blunted foveal reflex, central edema-slightly improved, scattered Microaneurysms, focal Exudates inferior macula--improving, RPE mottling and clumping, persistent large central cyst--improved   Vessels Vascular attenuation, mild Tortuousity Vascular attenuation, mild Tortuousity   Periphery Attached, scattered IRH, operculated hole with pigment surrounding, no SRF at 0800 Attached, rare MA          IMAGING AND PROCEDURES  Imaging and Procedures for @TODAY @  OCT, Retina - OU - Both Eyes       Right Eye Quality was good. Central Foveal Thickness: 316. Progression has improved. Findings include abnormal foveal contour, no SRF, intraretinal fluid, intraretinal hyper-reflective material  (Interval improvement in foveal profile and IRF).   Left Eye Quality was good. Central Foveal Thickness: 379. Progression has improved. Findings include abnormal foveal contour, intraretinal fluid, no SRF, intraretinal hyper-reflective material, outer retinal atrophy (Interval improvment in IRF and centra large cyst).   Notes *Images captured and stored on drive  Diagnosis /  Impression:  OD: interval improvement in IRF and foveal profile OS: Interval improvment in IRF and central  cyst  Clinical management:  See below  Abbreviations: NFP - Normal foveal profile. CME - cystoid macular edema. PED - pigment epithelial detachment. IRF - intraretinal fluid. SRF - subretinal fluid. EZ - ellipsoid zone. ERM - epiretinal membrane. ORA - outer retinal atrophy. ORT - outer retinal tubulation. SRHM - subretinal hyper-reflective material        Intravitreal Injection, Pharmacologic Agent - OD - Right Eye       Time Out 05/16/2019. 10:51 AM. Confirmed correct patient, procedure, site, and patient consented.   Anesthesia Topical anesthesia was used. Anesthetic medications included Lidocaine 2%, Proparacaine 0.5%.   Procedure Preparation included 5% betadine to ocular surface, eyelid speculum. A 30 gauge needle was used.   Injection:  1.25 mg Bevacizumab (AVASTIN) SOLN   NDC: 29528-413-24, Lot: [id number] , Expiration date: 07/19/2019   Route: Intravitreal, Site: Right Eye, Waste: 0 mL  Post-op Post injection exam found visual acuity of at least counting fingers. The patient tolerated the procedure well. There were no complications. The patient received written and verbal post procedure care education.        Intravitreal Injection, Pharmacologic Agent - OS - Left Eye       Time Out 05/16/2019. 10:53 AM. Confirmed correct patient, procedure, site, and patient consented.   Anesthesia Topical anesthesia was used. Anesthetic medications included Lidocaine 2%, Proparacaine 0.5%.    Procedure Preparation included 5% betadine to ocular surface, eyelid speculum. A 30 gauge needle was used.   Injection:  2 mg aflibercept 05/29/2019) SOLN   NDC: Alfonse Flavors, Lot: [id number], Expiration date: 05/10/2019   Route: Intravitreal, Site: Left Eye, Waste: 0.05 mL  Post-op Post injection exam found visual acuity of at least counting fingers. The patient tolerated the procedure well. There were no complications. The patient received written and verbal post procedure care education.   Notes **SAMPLE MEDICATION ADMINISTERED**                ASSESSMENT/PLAN:    ICD-10-CM   1. Moderate nonproliferative diabetic retinopathy of both eyes with macular edema associated with type 2 diabetes mellitus (HCC)  05/23/2019 Intravitreal Injection, Pharmacologic Agent - OD - Right Eye    Intravitreal Injection, Pharmacologic Agent - OS - Left Eye    aflibercept (EYLEA) SOLN 2 mg    Bevacizumab (AVASTIN) SOLN 1.25 mg  2. Retinal edema  H35.81 OCT, Retina - OU - Both Eyes  3. Retinal hole of right eye  H33.321   4. Essential hypertension  I10   5. Hypertensive retinopathy of both eyes  H35.033   6. Pseudophakia of both eyes  Z96.1     1,2. Moderate Non-proliferative diabetic retinopathy w/ DME, OU (OS>OD)  - previously a pt of Dr. B51.0258 -- per Dr. Zadie Rhine note, s/p multiple IVA OS (x7), s/p focal laser OS  - pt has never been formally diagnosed with DM -- exam very suggestive of DM  - recommend work up / A1c check by PCP  - exam shows scattered [place] OU; no obvious NV  - s/p focal laser OD (08.21.20)  - s/p IVA OU #1 (08.06.20), #2 (09.11.20), #3 (10.16.20), #4 (11.13.20)  - no significant improvement s/p IVA OU x4 -- ?resistance  - discussed possibility of switching medication  - s/p IVE OU #1 (12.18.20) -- excellent initial response  - OCT shows improvement in IRF and foveal profile OD and interval improvement in IRF and  central large cyst OS  - BCVA OD stable OD at 20/40-1 and  OS at  20/80+2  - recommend IVE OS #2 today (sample given), 01.27.21 and IVA #5 OD today, 01.27.21 -- Eylea benefits investigation pending for 2021  - pt wishes to proceed  - RBA of procedure discussed, questions answered  - informed consent obtained and signed  - see procedure notes  - Eylea4U benefits investigation started 11.13.20 --approved as of 12.18.20  - Eylea4U benefits for 2021 not approved as of 01.27.21, sample given OS today  - f/u in 4 wks, DFE, OCT, possible injection  3. Operculated hole OD  - located at 0800, with surrounding pigment, no SRF  - s/p laser retinopexy OD (10.16.20)   - good laser in place  - monitor  4,5 Hypertensive retinopathy OU  - discussed importance of tight BP control and its potential contribution to macular edema  - monitor  6. Pseudophakia OU  - s/p CE/IOL OU -- unknown cataract surgeon  - beautiful surgeries, doing well  - monitor   Ophthalmic Meds Ordered this visit:  Meds ordered this encounter  Medications  . aflibercept (EYLEA) SOLN 2 mg  . Bevacizumab (AVASTIN) SOLN 1.25 mg       Return in about 4 weeks (around 06/13/2019) for DFE, OCT, possible injxn (eylea).  There are no Patient Instructions on file for this visit.   Explained the diagnoses, plan, and follow up with the patient and they expressed understanding.  Patient expressed understanding of the importance of proper follow up care.   This document serves as a record of services personally performed by Gardiner Sleeper, MD, PhD. It was created on their behalf by Roselee Nova, COMT. The creation of this record is the provider's dictation and/or activities during the visit.  Electronically signed by: Roselee Nova, COMT 05/16/19 1:23 PM  Gardiner Sleeper, M.D., Ph.D. Diseases & Surgery of the Retina and [place] 05/16/2019   I have reviewed the above documentation for accuracy and completeness, and I agree with the above. Gardiner Sleeper,  M.D., Ph.D. 05/16/19 1:23 PM     Abbreviations: M myopia (nearsighted); A astigmatism; H hyperopia (farsighted); P presbyopia; Mrx spectacle prescription;  CTL contact lenses; OD right eye; OS left eye; OU both eyes  XT exotropia; ET esotropia; PEK punctate epithelial keratitis; PEE punctate epithelial erosions; DES dry eye syndrome; MGD meibomian gland dysfunction; ATs artificial tears; PFAT's preservative free artificial tears; [place] nuclear sclerotic cataract; PSC posterior subcapsular cataract; ERM epi-retinal membrane; PVD posterior vitreous detachment; RD retinal detachment; DM diabetes mellitus; DR diabetic retinopathy; NPDR non-proliferative diabetic retinopathy; PDR proliferative diabetic retinopathy; CSME clinically significant macular edema; DME diabetic macular edema; dbh dot blot hemorrhages; CWS cotton wool spot; POAG primary open angle glaucoma; C/D cup-to-disc ratio; HVF humphrey visual field; GVF goldmann visual field; OCT optical coherence tomography; IOP intraocular pressure; BRVO Branch retinal vein occlusion; CRVO central retinal vein occlusion; CRAO central retinal artery occlusion; BRAO branch retinal artery occlusion; RT retinal tear; SB scleral buckle; PPV pars plana vitrectomy; VH Vitreous hemorrhage; PRP panretinal laser photocoagulation; IVK intravitreal kenalog; VMT vitreomacular traction; MH Macular hole;  NVD neovascularization of the disc; NVE neovascularization elsewhere; AREDS age related eye disease study; ARMD age related macular degeneration; POAG primary open angle glaucoma; EBMD epithelial/anterior basement membrane dystrophy; ACIOL anterior chamber intraocular lens; IOL intraocular lens; PCIOL posterior chamber intraocular lens; Phaco/IOL phacoemulsification with intraocular lens placement; PRK photorefractive keratectomy; LASIK laser assisted in  situ keratomileusis; HTN hypertension; DM diabetes mellitus; COPD chronic obstructive pulmonary disease

## 2019-05-24 ENCOUNTER — Ambulatory Visit (INDEPENDENT_AMBULATORY_CARE_PROVIDER_SITE_OTHER): Payer: Medicare HMO | Admitting: Licensed Clinical Social Worker

## 2019-05-24 DIAGNOSIS — M810 Age-related osteoporosis without current pathological fracture: Secondary | ICD-10-CM | POA: Diagnosis not present

## 2019-05-24 DIAGNOSIS — Z85118 Personal history of other malignant neoplasm of bronchus and lung: Secondary | ICD-10-CM | POA: Diagnosis not present

## 2019-05-24 DIAGNOSIS — E785 Hyperlipidemia, unspecified: Secondary | ICD-10-CM

## 2019-05-24 DIAGNOSIS — Z85841 Personal history of malignant neoplasm of brain: Secondary | ICD-10-CM

## 2019-05-24 DIAGNOSIS — R413 Other amnesia: Secondary | ICD-10-CM

## 2019-05-24 NOTE — Patient Instructions (Addendum)
Licensed Clinical Social Worker Visit Information  Goals we discussed today:  Goals        . Client said she wants help in improving her living situation and wants to look for other housing options in area. (pt-stated)     Current Barriers:  . Financial constraints of client with chronic diagnoses of CKD, Osteoporosis, Hx Cancer of Lung, Hx of Cancer of the brain, Hyperlipidemia, and Memory impairment . Limited social support  Clinical Social Work Clinical Goal(s):  Marland Kitchen Over next 30 days, client will work with LCSW to address concerns related to housing issues of client and discuss housing options for client in the area.   Interventions: Encouraged client to call LCSW to discuss psychosocial needs of client  Talked with client about client safety status  Encouraged client to use relaxation techniques of choice to help her manage stress issues (watches TV, listens to music, take a walk outdoors)  Talked with client about self care activities of choice  Talked with client about her upcoming client appointments  Talked with client about her transport needs  Talked with client about ambulation needs of client  Talked with client about pain issues of client  Patient Self Care Activities:  . Attends all scheduled provider appointments  . Performs ADL's independently . Contacts [name], caregiver to discuss client needs  Plan:  . Patient will contact LCSW to discuss psychosical needs of client  . LCSW will call client in next four weeks to assess client housing needs . Patient will communicate with RN CM to discuss nursing needs of client . Client to call 911 for emergency assistance as needed.  . Client to attend scheduled client medical appointments  Initial goal documentation          Materials Provided: No  Follow Up Plan:  LCSW will call client in next 4 weeks to assess client housing needs at that time  The patient verbalized understanding of instructions provided today  and declined a print copy of patient instruction materials.   Norva Riffle.Shiloh Southern MSW, LCSW Licensed Clinical Social Worker [facility] [phone]

## 2019-05-24 NOTE — Chronic Care Management (AMB) (Signed)
  Care Management Note   Jamie [name] is a 69 y.o. year old female who is a primary care patient of Terald Sleeper, PA-C. The CM team was consulted for assistance with chronic disease management and care coordination.   I reached out to [name] by phone today.   Review of patient status, including review of consultants reports, relevant laboratory and other test results, and collaboration with appropriate care team members and the patient's provider was performed as part of comprehensive patient evaluation and provision of chronic care management services.   Social determinants of health: risk of social isolation; risk of tobacco use; risk of depression; risk of physical inactivity    Chronic Care Management from 07/17/2018 in [place]  PHQ-9 Total Score  4     Medications   donepezil (ARICEPT) 5 MG tablet folic acid (FOLVITE) 1 MG tablet Ibuprofen-diphenhydrAMINE Cit (ADVIL PM PO) Multiple Vitamin (MULTIVITAMIN) tablet rosuvastatin (CRESTOR) 10 MG tablet  Goals        . Client said she wants help in improving her living situation and wants to look for other housing options in area. (pt-stated)     Current Barriers:  . Financial constraints of client with chronic diagnoses of CKD, Osteoporosis, Hx Cancer of Lung, Hx of Cancer of the brain, Hyperlipidemia, and Memory impairment . Limited social support  Clinical Social Work Clinical Goal(s):  Marland Kitchen Over next 30 days, client will work with LCSW to address concerns related to housing issues of client and discuss housing options for client in the area.   Interventions:  Encouraged client to call LCSW to discuss psychosocial needs of client  Talked with client about client safety status  Encouraged client to use relaxation techniques of choice to help her manage stress issues (watches TV, listens to music, take a walk outdoors)  Talked with client about self care activities of choice  Talked with  client about her upcoming client appointments  Talked with client about her transport needs  Talked with client about ambulation needs of client  Talked with client about pain issues of client  Patient Self Care Activities:  . Attends all scheduled provider appointments  . Performs ADL's independently . Contacts [name], caregiver to discuss client needs  Plan:  . Patient will contact LCSW to discuss psychosical needs of client  . LCSW will call client in next 4 weeks  to assess client housing needs . Patient will communicate with RN CM to discuss nursing needs of client . Client to call 911 for emergency assistance as needed.  . Client to attend scheduled client medical appointments  Initial goal documentation     Follow Up Plan: LCSW to call client in next 4 weeks to assess the housing needs of client at that time  Norva Riffle.Salimatou Simone MSW, LCSW Licensed Clinical Social Worker [facility] [phone]

## 2019-06-13 ENCOUNTER — Encounter (INDEPENDENT_AMBULATORY_CARE_PROVIDER_SITE_OTHER): Payer: Medicare Other | Admitting: Ophthalmology

## 2019-06-13 ENCOUNTER — Encounter (INDEPENDENT_AMBULATORY_CARE_PROVIDER_SITE_OTHER): Payer: Self-pay

## 2019-06-20 ENCOUNTER — Ambulatory Visit (INDEPENDENT_AMBULATORY_CARE_PROVIDER_SITE_OTHER): Payer: Medicare HMO | Admitting: Licensed Clinical Social Worker

## 2019-06-20 DIAGNOSIS — E785 Hyperlipidemia, unspecified: Secondary | ICD-10-CM | POA: Diagnosis not present

## 2019-06-20 DIAGNOSIS — M810 Age-related osteoporosis without current pathological fracture: Secondary | ICD-10-CM | POA: Diagnosis not present

## 2019-06-20 DIAGNOSIS — N183 Chronic kidney disease, stage 3 unspecified: Secondary | ICD-10-CM | POA: Diagnosis not present

## 2019-06-20 DIAGNOSIS — Z85841 Personal history of malignant neoplasm of brain: Secondary | ICD-10-CM

## 2019-06-20 DIAGNOSIS — R413 Other amnesia: Secondary | ICD-10-CM

## 2019-06-20 DIAGNOSIS — Z85118 Personal history of other malignant neoplasm of bronchus and lung: Secondary | ICD-10-CM | POA: Diagnosis not present

## 2019-06-20 NOTE — Patient Instructions (Addendum)
Licensed Clinical Social Worker Visit Information  Goals we discussed today:  Goals        . Client said she wants help in improving her living situation and wants to look for other housing options in area. (pt-stated)     Current Barriers:  . Financial constraints of client with chronic diagnoses of CKD, Osteoporosis, Hx Cancer of Lung, Hx of Cancer of the brain, Hyperlipidemia, and Memory impairment . Limited social support  Clinical Social Work Clinical Goal(s):  Marland Kitchen Over next 30 days, client will work with LCSW to address concerns related to housing issues of client and discuss housing options for client in the area.   Interventions:  Encouraged client to call LCSW to discuss psychosocial needs of client  Talked with client about client safety status  Encouraged client to use relaxation techniques of choice to help her manage stress issues(watches TV, listens to music, take a walk outdoors)  Talked with client about self care activities of choice  Talked with client about her upcoming client appointments  Talked with client about her transport needs  Talked with client about ambulation needs of client  Talked with client about pain issues of client   Encouraged client to call RNCM to discuss client's nursing needs  Patient Self Care Activities:  . Attends all scheduled provider appointments  . Performs ADL's independently . Contacts [name], caregiver to discuss client needs  Plan:  . Patient will contact LCSW to discuss psychosical needs of client  . LCSW will call client in next four weeks to assess client housing needs . Patient will communicate with RN CM to discuss nursing needs of client . Client to call 911 for emergency assistance as needed.  . Client to attend scheduled client medical appointments  Initial goal documentation      Materials Provided: No  Follow Up Plan:  LCSW will call client in next 4 weeks to assess client housing needs at that  time  The patient verbalized understanding of instructions provided today and declined a print copy of patient instruction materials.   Norva Riffle.Naseem Adler MSW, LCSW Licensed Clinical Social Worker [facility] [phone]

## 2019-06-20 NOTE — Chronic Care Management (AMB) (Signed)
  Care Management Note   Jamie [name] is a 69 y.o. year old female who is a primary care patient of Jamie Sleeper, PA-C. The CM team was consulted for assistance with chronic disease management and care coordination.   I reached out to Jamie [name] by phone today.    Review of patient status, including review of consultants reports, relevant laboratory and other test results, and collaboration with appropriate care team members and the patient's provider was performed as part of comprehensive patient evaluation and provision of chronic care management services.   Social determinants of health: risk of social isolation; risk of tobacco use; risk of physical inactivity    Chronic Care Management from 07/17/2018 in [place]  PHQ-9 Total Score  4     Medications   donepezil (ARICEPT) 5 MG tablet folic acid (FOLVITE) 1 MG tablet Ibuprofen-diphenhydrAMINE Cit (ADVIL PM PO) Multiple Vitamin (MULTIVITAMIN) tablet rosuvastatin (CRESTOR) 10 MG tablet  Goals        . Client said she wants help in improving her living situation and wants to look for other housing options in area. (pt-stated)     Current Barriers:  . Financial constraints of client with chronic diagnoses of CKD, Osteoporosis, Hx Cancer of Lung, Hx of Cancer of the brain, Hyperlipidemia, and Memory impairment . Limited social support  Clinical Social Work Clinical Goal(s):  Jamie [name] Kitchen Over next 30 days, client will work with LCSW to address concerns related to housing issues of client and discuss housing options for client in the area.   Interventions:  Encouraged client to call LCSW to discuss psychosocial needs of client  Talked with client about client safety status  Encouraged client to use relaxation techniques of choice to help her manage stress issues(watches TV, listens to music, take a walk outdoors)  Talked with client about self care activities of choice  Talked with client about her  upcoming client appointments  Talked with client about her transport needs  Talked with client about ambulation needs of client  Talked with client about pain issues of client   Encouraged client to call RNCM to discuss client's nursing needs  Patient Self Care Activities:  . Attends all scheduled provider appointments  . Performs ADL's independently . Contacts Jamie [name], caregiver to discuss client needs  Plan:  . Patient will contact LCSW to discuss psychosical needs of client  . LCSW will call client in four weeks to assess client housing needs . Patient will communicate with RN CM to discuss nursing needs of client . Client to call 911 for emergency assistance as needed.  . Client to attend scheduled client medical appointments  Initial goal documentation      Follow Up Plan: LCSW to call client in next 4 weeks to assess client housing needs at that time  Jamie [name] MSW, LCSW Licensed Clinical Social Worker [facility] [phone]

## 2019-06-21 ENCOUNTER — Telehealth: Payer: Self-pay

## 2019-06-27 ENCOUNTER — Other Ambulatory Visit: Payer: Self-pay | Admitting: Physician Assistant

## 2019-07-05 ENCOUNTER — Telehealth: Payer: Self-pay | Admitting: Physician Assistant

## 2019-07-05 ENCOUNTER — Other Ambulatory Visit: Payer: Self-pay | Admitting: Physician Assistant

## 2019-07-05 NOTE — Telephone Encounter (Signed)
Appointment scheduled for 07/11/2019 at 3:55 pm with Particia Nearing.  Patient aware.

## 2019-07-11 ENCOUNTER — Ambulatory Visit: Payer: Medicare HMO | Admitting: Physician Assistant

## 2019-07-20 ENCOUNTER — Encounter (INDEPENDENT_AMBULATORY_CARE_PROVIDER_SITE_OTHER): Payer: Self-pay

## 2019-07-20 ENCOUNTER — Encounter (INDEPENDENT_AMBULATORY_CARE_PROVIDER_SITE_OTHER): Payer: Medicare HMO | Admitting: Ophthalmology

## 2019-07-23 ENCOUNTER — Ambulatory Visit (INDEPENDENT_AMBULATORY_CARE_PROVIDER_SITE_OTHER): Payer: Medicare HMO | Admitting: Licensed Clinical Social Worker

## 2019-07-23 DIAGNOSIS — M810 Age-related osteoporosis without current pathological fracture: Secondary | ICD-10-CM | POA: Diagnosis not present

## 2019-07-23 DIAGNOSIS — E785 Hyperlipidemia, unspecified: Secondary | ICD-10-CM | POA: Diagnosis not present

## 2019-07-23 DIAGNOSIS — Z85118 Personal history of other malignant neoplasm of bronchus and lung: Secondary | ICD-10-CM

## 2019-07-23 DIAGNOSIS — Z85841 Personal history of malignant neoplasm of brain: Secondary | ICD-10-CM

## 2019-07-23 DIAGNOSIS — R413 Other amnesia: Secondary | ICD-10-CM

## 2019-07-23 NOTE — Patient Instructions (Addendum)
Licensed Clinical Social Worker Visit Information  Goals we discussed today:  Goals        . Client said she wants help in improving her living situation and wants to look for other housing options in area. (pt-stated)     Current Barriers:  . Financial constraints of client with chronic diagnoses of CKD, Osteoporosis, Hx Cancer of Lung, Hx of Cancer of the brain, Hyperlipidemia, and Memory impairment . Limited social support  Clinical Social Work Clinical Goal(s):  Marland Kitchen Over next 30 days, client will work with LCSW to address concerns related to housing issues of client and discuss housing options for client in the area.   Interventions:  Talked with client about client safety status  Encouraged client to use relaxation techniques of choice to help her manage stress issues (watches TV, listens to music, take a walk outdoors)  Talked with client about self care activities of choice  Talked with client about her upcoming client appointments  Talked with client about her transport needs  Talked with client about ambulation needs of client  Talked with client about pain issues of client  Encouraged client to call RNCM to discuss client's nursing needs  Patient Self Care Activities:  . Attends all scheduled provider appointments  . Performs ADL's independently . Contacts [name], caregiver to discuss client needs  Plan:  . Patient will contact LCSW to discuss psychosical needs of client  . LCSW will call client in four weeks to assess client housing needs . Patient will communicate with RN CM to discuss nursing needs of client . Client to call 911 for emergency assistance as needed.  . Client to attend scheduled client medical appointments  Initial goal documentation         Materials Provided: No  Follow Up Plan: LCSW will call client in next 4 weeks to assess the housing needs of client  at that time  The patient verbalized understanding of instructions provided today and  declined a print copy of patient instruction materials.   Norva Riffle.Hank Walling MSW, LCSW Licensed Clinical Social Worker [facility] [phone]

## 2019-07-23 NOTE — Chronic Care Management (AMB) (Signed)
  Care Management Note   Jamie [name] is a 69 y.o. year old female who is a primary care patient of Dettinger, Vonna Kotyk. The CM team was consulted for assistance with chronic disease management and care coordination.   I reached out to [name] by phone today.    Review of patient status, including review of consultants reports, relevant laboratory and other test results, and collaboration with appropriate care team members and the patient's provider was performed as part of comprehensive patient evaluation and provision of chronic care management services.   Social determinants of health: risk of social isolation; risk of tobacco use; risk of physical inactivity    Chronic Care Management from 07/17/2018 in [place]  PHQ-9 Total Score  4     Medications   donepezil (ARICEPT) 5 MG tablet folic acid (FOLVITE) 1 MG tablet Ibuprofen-diphenhydrAMINE Cit (ADVIL PM PO) Multiple Vitamin (MULTIVITAMIN) tablet rosuvastatin (CRESTOR) 10 MG tablet  Goals    . Client said she wants help in improving her living situation and wants to look for other housing options in area. (pt-stated)     Current Barriers:  . Financial constraints of client with chronic diagnoses of CKD, Osteoporosis, Hx Cancer of Lung, Hx of Cancer of the brain, Hyperlipidemia, and Memory impairment . Limited social support  Clinical Social Work Clinical Goal(s):  Marland Kitchen Over next 30 days, client will work with LCSW to address concerns related to housing issues of client and discuss housing options for client in the area.   Interventions:  Talked with client about client safety status  Encouraged client to use relaxation techniques of choice to help her manage stress issues(watches TV, listens to music, take a walk outdoors)  Talked with client about self care activities of choice  Talked with client about her upcoming client appointments  Talked with client about her transport needs  Talked  with client about ambulation needs of client  Talked with client about pain issues of client   Encouraged client to call RNCM to discuss client's nursing needs  Patient Self Care Activities:  . Attends all scheduled provider appointments  . Performs ADL's independently . Contacts [name], caregiver to discuss client needs  Plan:  . Patient will contact LCSW to discuss psychosical needs of client  . LCSW will call client in three weeks to assess client housing needs . Patient will communicate with RN CM to discuss nursing needs of client . Client to call 911 for emergency assistance as needed.  . Client to attend scheduled client medical appointments  Initial goal documentation     Follow Up Plan: LCSW to call client in next 4 weeks to assess client's housing needs at that time  Norva Riffle.Deryn Massengale MSW, LCSW Licensed Clinical Social Worker [facility] [phone]

## 2019-07-25 ENCOUNTER — Encounter: Payer: Self-pay | Admitting: Family Medicine

## 2019-07-25 ENCOUNTER — Encounter (INDEPENDENT_AMBULATORY_CARE_PROVIDER_SITE_OTHER): Payer: Medicare HMO | Admitting: Ophthalmology

## 2019-07-25 ENCOUNTER — Ambulatory Visit (INDEPENDENT_AMBULATORY_CARE_PROVIDER_SITE_OTHER): Payer: Medicare HMO | Admitting: Family Medicine

## 2019-07-25 ENCOUNTER — Ambulatory Visit: Payer: Medicare HMO | Admitting: Physician Assistant

## 2019-07-25 DIAGNOSIS — E538 Deficiency of other specified B group vitamins: Secondary | ICD-10-CM | POA: Diagnosis not present

## 2019-07-25 DIAGNOSIS — R413 Other amnesia: Secondary | ICD-10-CM

## 2019-07-25 DIAGNOSIS — E7800 Pure hypercholesterolemia, unspecified: Secondary | ICD-10-CM

## 2019-07-25 MED ORDER — ROSUVASTATIN CALCIUM 10 MG PO TABS: 10.0000 mg | ORAL_TABLET | Freq: Every day | ORAL | 3 refills | Status: DC

## 2019-07-25 MED ORDER — FOLIC ACID 1 MG PO TABS: 1.0000 mg | ORAL_TABLET | Freq: Every day | ORAL | 3 refills | Status: DC

## 2019-07-25 MED ORDER — DONEPEZIL HCL 5 MG PO TABS: 5.0000 mg | ORAL_TABLET | Freq: Every day | ORAL | 3 refills | Status: DC

## 2019-07-25 NOTE — Progress Notes (Signed)
Virtual Visit via telephone Note  I connected with Jamie [name] on 07/25/19 at 1316 by telephone and verified that I am speaking with the correct person using two identifiers. Jamie [name] is currently located at home and no other people are currently with her during visit. The provider, Fransisca Kaufmann Becky Berberian, MD is located in their office at time of visit.  Call ended at 1322  I discussed the limitations, risks, security and privacy concerns of performing an evaluation and management service by telephone and the availability of in person appointments. I also discussed with the patient that there may be a patient responsible charge related to this service. The patient expressed understanding and agreed to proceed.   History and Present Illness: Hyperlipidemia Patient is coming in for recheck of his hyperlipidemia. The patient is currently taking crestor. They deny any issues with myalgias or history of liver damage from it. They deny any focal numbness or weakness or chest pain.   Memory  Patient takes aricept and it seems to helps some. And is doing well  Patient takes folate   No diagnosis found.  Outpatient Encounter Medications as of 07/25/2019  Medication Sig  . donepezil (ARICEPT) 5 MG tablet Take 1 tablet (5 mg total) by mouth at bedtime.  . folic acid (FOLVITE) 1 MG tablet Take 1 tablet (1 mg total) by mouth daily.  . Ibuprofen-diphenhydrAMINE Cit (ADVIL PM PO) Take by mouth at bedtime.  . Multiple Vitamin (MULTIVITAMIN) tablet Take 1 tablet by mouth daily.  . rosuvastatin (CRESTOR) 10 MG tablet Take 1 tablet (10 mg total) by mouth at bedtime.   No facility-administered encounter medications on file as of 07/25/2019.    Review of Systems  Constitutional: Negative for chills and fever.  Eyes: Negative for visual disturbance.  Respiratory: Negative for chest tightness and shortness of breath.   Cardiovascular: Negative for chest pain and leg swelling.  Musculoskeletal:  Negative for back pain and gait problem.  Skin: Negative for rash.  Neurological: Negative for light-headedness and headaches.  Psychiatric/Behavioral: Positive for confusion. Negative for agitation, behavioral problems, self-injury, sleep disturbance and suicidal ideas.  All other systems reviewed and are negative.   Observations/Objective: Patient sounds comfortable and in no acute distress  Assessment and Plan: Problem List Items Addressed This Visit      Other   Memory impairment (Chronic)   Relevant Medications   donepezil (ARICEPT) 5 MG tablet   Hyperlipidemia (Chronic)   Relevant Medications   rosuvastatin (CRESTOR) 10 MG tablet   Folate deficiency   Relevant Medications   folic acid (FOLVITE) 1 MG tablet      Continue current medications Follow up plan: Return in about 2 months (around 09/24/2019), or if symptoms worsen or fail to improve, for bloodwork and hyperlipidemia.     I discussed the assessment and treatment plan with the patient. The patient was provided an opportunity to ask questions and all were answered. The patient agreed with the plan and demonstrated an understanding of the instructions.   The patient was advised to call back or seek an in-person evaluation if the symptoms worsen or if the condition fails to improve as anticipated.  The above assessment and management plan was discussed with the patient. The patient verbalized understanding of and has agreed to the management plan. Patient is aware to call the clinic if symptoms persist or worsen. Patient is aware when to return to the clinic for a follow-up visit. Patient educated on when it is  appropriate to go to the emergency department.    I provided 6 minutes of non-face-to-face time during this encounter.    Worthy Rancher, MD

## 2019-08-08 ENCOUNTER — Encounter: Payer: Self-pay | Admitting: Internal Medicine

## 2019-08-21 ENCOUNTER — Ambulatory Visit (INDEPENDENT_AMBULATORY_CARE_PROVIDER_SITE_OTHER): Payer: Medicare HMO | Admitting: Licensed Clinical Social Worker

## 2019-08-21 DIAGNOSIS — E785 Hyperlipidemia, unspecified: Secondary | ICD-10-CM | POA: Diagnosis not present

## 2019-08-21 DIAGNOSIS — M810 Age-related osteoporosis without current pathological fracture: Secondary | ICD-10-CM | POA: Diagnosis not present

## 2019-08-21 DIAGNOSIS — Z85118 Personal history of other malignant neoplasm of bronchus and lung: Secondary | ICD-10-CM

## 2019-08-21 DIAGNOSIS — R413 Other amnesia: Secondary | ICD-10-CM

## 2019-08-21 DIAGNOSIS — Z85841 Personal history of malignant neoplasm of brain: Secondary | ICD-10-CM

## 2019-08-21 NOTE — Patient Instructions (Addendum)
Licensed Clinical Social Worker Visit Information  Goals we discussed today:  Goals        . Client said she wants help in improving her living situation and wants to look for other housing options in area. (pt-stated)     Current Barriers:  . Financial constraints of client with chronic diagnoses of CKD, Osteoporosis, Hx Cancer of Lung, Hx of Cancer of the brain, Hyperlipidemia, and Memory impairment . Limited social support  Clinical Social Work Clinical Goal(s):  Marland Kitchen Over next 30 days, client will work with LCSW to address concerns related to housing issues of client and discuss housing options for client in the area.   Interventions: Encouraged client to use relaxation techniques of choice to help her manage stress issues (watches TV, listens to music )  Talked with client about self care activities of choice  Talked with client about her upcoming client appointments  Talked with client about her transport needs  Talked with client about ambulation needs of client (uses a cane or walker to ambulate)  Talked with client about pain issues of client  Encouraged client to call RNCM to discuss client's nursing needs  Talked with Braylen about her recent fall, several weeks ago (she said she had no serious injury)  Talked with Vaughan Basta about her current food supply (she said her son helps her obtain needed food Items)  Talked with Vaughan Basta about in home support she received weekly from friend CJ (helps her cook, clean, keep up home)  Talked with client about dental needs of client  Patient Self Care Activities:  . Attends all scheduled provider appointments  . Performs ADL's independently . Contacts [name], caregiver to discuss client needs  Plan:  . Patient will contact LCSW to discuss psychosical needs of client  . LCSW will call client in next 4 weeks to assess client housing needs . Patient will communicate with RN CM to discuss nursing needs of client . Client to call 911 for  emergency assistance as needed.  . Client to attend scheduled client medical appointments  Initial goal documentation      Materials Provided: No  Follow Up Plan: LCSW to call client in next 4 weeks to assess the housing needs of client  at that time  The patient verbalized understanding of instructions provided today and declined a print copy of patient instruction materials.   Norva Riffle.Obera Stauch MSW, LCSW Licensed Clinical Social Worker [facility] [phone]

## 2019-08-21 NOTE — Chronic Care Management (AMB) (Signed)
Chronic Care Management    Clinical Social Work Follow Up Note  08/21/2019 Name: Jamie [name] MRN: [id number] DOB: 12-03-1950  Jamie [name] is a 69 y.o. year old female who is a primary care patient of Dettinger, Vonna Kotyk, MD. The CCM team was consulted for assistance with [employer] .   Review of patient status, including review of consultants reports, other relevant assessments, and collaboration with appropriate care team members and the patient's provider was performed as part of comprehensive patient evaluation and provision of chronic care management services.    SDOH (Social Determinants of Health) assessments performed: Yes; risk for social isolation; risk for tobacco use; risk for physical inactivity    Chronic Care Management from 07/17/2018 in [place]  PHQ-9 Total Score  4      Outpatient Encounter Medications as of 08/21/2019  Medication Sig  . donepezil (ARICEPT) 5 MG tablet Take 1 tablet (5 mg total) by mouth at bedtime.  . folic acid (FOLVITE) 1 MG tablet Take 1 tablet (1 mg total) by mouth daily.  . Ibuprofen-diphenhydrAMINE Cit (ADVIL PM PO) Take by mouth at bedtime.  . Multiple Vitamin (MULTIVITAMIN) tablet Take 1 tablet by mouth daily.  . rosuvastatin (CRESTOR) 10 MG tablet Take 1 tablet (10 mg total) by mouth at bedtime.   No facility-administered encounter medications on file as of 08/21/2019.    Goals        . Client said she wants help in improving her living situation and wants to look for other housing options in area. (pt-stated)     Current Barriers:  . Financial constraints of client with chronic diagnoses of CKD, Osteoporosis, Hx Cancer of Lung, Hx of Cancer of the brain, Hyperlipidemia, and Memory impairment . Limited social support  Clinical Social Work Clinical Goal(s):  Marland Kitchen Over next 30 days, client will work with LCSW to address concerns related to housing issues of client and discuss housing options for client in the  area.   Interventions:  Encouraged client to use relaxation techniques of choice to help her manage stress issues(watches TV, listens to music )  Talked with client about self care activities of choice  Talked with client about her upcoming client appointments  Talked with client about her transport needs  Talked with client about ambulation needs of client (uses a cane or walker to ambulate)  Talked with client about pain issues of client  Encouraged client to call RNCM to discuss client's nursing needs  Talked with Deasha about her recent fall, several weeks ago (she said she had no serious injury)  Talked with Jamie [name] about her current food supply (she said her son helps her obtain needed food       Items)     Talked with Jamie [name] about in home support she received weekly from friend CJ (helps her cook,     clean, keep up home) Talked with client about dental needs of client  Patient Self Care Activities:  . Attends all scheduled provider appointments  . Performs ADL's independently . Contacts [name], caregiver to discuss client needs  Plan:  . Patient will contact LCSW to discuss psychosical needs of client  . LCSW will call client in next 4 weeks to assess client housing needs . Patient will communicate with RN CM to discuss nursing needs of client . Client to call 911 for emergency assistance as needed.  . Client to attend scheduled client medical appointments  Initial goal documentation  Follow Up Plan: LCSW will call client in next 4 weeks to assess client housing needs at that time  Norva Riffle.Jamie [name] MSW, LCSW Licensed Clinical Social Worker [facility] [phone]

## 2019-08-31 ENCOUNTER — Ambulatory Visit: Payer: Medicare HMO | Admitting: Family Medicine

## 2019-08-31 ENCOUNTER — Encounter: Payer: Self-pay | Admitting: Family Medicine

## 2019-09-07 ENCOUNTER — Ambulatory Visit: Payer: Medicare HMO | Admitting: Family Medicine

## 2019-09-14 ENCOUNTER — Ambulatory Visit: Payer: Medicare HMO | Admitting: Nurse Practitioner

## 2019-09-19 ENCOUNTER — Ambulatory Visit: Payer: Medicare Other | Admitting: Internal Medicine

## 2019-09-20 ENCOUNTER — Encounter: Payer: Self-pay | Admitting: Family Medicine

## 2019-09-21 ENCOUNTER — Ambulatory Visit: Payer: Medicare Other | Admitting: *Deleted

## 2019-09-21 DIAGNOSIS — Z9181 History of falling: Secondary | ICD-10-CM

## 2019-09-21 DIAGNOSIS — R413 Other amnesia: Secondary | ICD-10-CM

## 2019-09-21 DIAGNOSIS — N183 Chronic kidney disease, stage 3 unspecified: Secondary | ICD-10-CM

## 2019-09-21 NOTE — Chronic Care Management (AMB) (Signed)
  Chronic Care Management   Follow-up Note  09/21/2019 Name: Jamie [name] MRN: [id number] DOB: [birth year]  Referred by: Loman Brooklyn, FNP Reason for referral : Chronic Care Management (RN follow up)   An unsuccessful telephone Follow-up was attempted today. The patient was referred to the case management team for assistance with care management and care coordination.   Follow Up Plan: The care management team will reach out to the patient again over the next 30 days.   LCSW telephone call scheduled for 09/24/19  [name], BSN, RN-BC [place] [facility] [place] [facility] Direct Dial: [phone]

## 2019-09-24 ENCOUNTER — Ambulatory Visit (INDEPENDENT_AMBULATORY_CARE_PROVIDER_SITE_OTHER): Payer: Medicare Other | Admitting: Licensed Clinical Social Worker

## 2019-09-24 DIAGNOSIS — N183 Chronic kidney disease, stage 3 unspecified: Secondary | ICD-10-CM | POA: Diagnosis not present

## 2019-09-24 DIAGNOSIS — E785 Hyperlipidemia, unspecified: Secondary | ICD-10-CM | POA: Diagnosis not present

## 2019-09-24 DIAGNOSIS — M810 Age-related osteoporosis without current pathological fracture: Secondary | ICD-10-CM

## 2019-09-24 DIAGNOSIS — Z85118 Personal history of other malignant neoplasm of bronchus and lung: Secondary | ICD-10-CM | POA: Diagnosis not present

## 2019-09-24 DIAGNOSIS — R413 Other amnesia: Secondary | ICD-10-CM

## 2019-09-24 DIAGNOSIS — Z85841 Personal history of malignant neoplasm of brain: Secondary | ICD-10-CM

## 2019-09-24 NOTE — Chronic Care Management (AMB) (Signed)
Chronic Care Management    Clinical Social Work Follow Up Note  09/24/2019 Name: JAUNA RACZYNSKI MRN: [id number] DOB: 1951/04/16  Jamie [name] is a 69 y.o. year old female who is a primary care patient of Loman Brooklyn, FNP. The CCM team was consulted for assistance with [employer] .   Review of patient status, including review of consultants reports, other relevant assessments, and collaboration with appropriate care team members and the patient's provider was performed as part of comprehensive patient evaluation and provision of chronic care management services.    SDOH (Social Determinants of Health) assessments performed: Yes;risk of social isolation; risk of tobacco use; risk of physical inactivity;     Chronic Care Management from 07/17/2018 in [place]  PHQ-9 Total Score  4      Outpatient Encounter Medications as of 09/24/2019  Medication Sig  . donepezil (ARICEPT) 5 MG tablet Take 1 tablet (5 mg total) by mouth at bedtime.  . folic acid (FOLVITE) 1 MG tablet Take 1 tablet (1 mg total) by mouth daily.  . Ibuprofen-diphenhydrAMINE Cit (ADVIL PM PO) Take by mouth at bedtime.  . Multiple Vitamin (MULTIVITAMIN) tablet Take 1 tablet by mouth daily.  . rosuvastatin (CRESTOR) 10 MG tablet Take 1 tablet (10 mg total) by mouth at bedtime.   No facility-administered encounter medications on file as of 09/24/2019.    Goals    . Client said she wants help in improving her living situation and wants to look for other housing options in area. (pt-stated)     Current Barriers:  . Financial constraints of client with chronic diagnoses of CKD, Osteoporosis, Hx Cancer of Lung, Hx of Cancer of the brain, Hyperlipidemia, and Memory impairment . Limited social support  Clinical Social Work Clinical Goal(s):  Marland Kitchen Over next 30 days, client will work with LCSW to address concerns related to housing issues of client and discuss housing options for client in the area.    Interventions:  Encouraged client to use relaxation techniques of choice to help her manage stress issues(watches TV, listens to music )  Talked with client about self care activities of choice  Talked with client about her upcoming client appointments  Talked with client about her transport needs  Talked with client about ambulation needs of client (uses a cane or walker to ambulate)  Talked with client about pain issues of client  Encouraged client to call RNCM to discuss client's nursing needs  Talked with Vaughan Basta about her current food supply (she said her son helps her obtain needed food       Items)     Talked with Vaughan Basta about in home support she received weekly from friend CJ (helps her cook,     clean, keep up home)  Talked with client about dental needs of client   Patient Self Care Activities:  . Attends all scheduled provider appointments  . Performs ADL's independently . Contacts [name], caregiver to discuss client needs  Plan:  . Patient will contact LCSW to discuss psychosical needs of client  . LCSW will call client in three weeks to assess client housing needs . Patient will communicate with RN CM to discuss nursing needs of client . Client to call 911 for emergency assistance as needed.  . Client to attend scheduled client medical appointments  Initial goal documentation     Follow Up Plan: LCSW will call client in next 4 weeks to assess client housing needs at that time  Norva Riffle.Vayla Wilhelmi MSW, LCSW Licensed Clinical Social Worker [facility] [phone]

## 2019-09-24 NOTE — Patient Instructions (Addendum)
Licensed Clinical Social Worker Visit Information  Materials Provided: No  Goals    . Client said she wants help in improving her living situation and wants to look for other housing options in area. (pt-stated)     Current Barriers:  . Financial constraints of client with chronic diagnoses of CKD, Osteoporosis, Hx Cancer of Lung, Hx of Cancer of the brain, Hyperlipidemia, and Memory impairment . Limited social support  Clinical Social Work Clinical Goal(s):  Marland Kitchen Over next 30 days, client will work with LCSW to address concerns related to housing issues of client and discuss housing options for client in the area.   Interventions:  Encouraged client to use relaxation techniques of choice to help her manage stress issues (watches TV, listens to music )  Talked with client about self care activities of choice  Talked with client about her upcoming client appointments  Talked with client about her transport needs  Talked with client about ambulation needs of client (uses a cane or walker to ambulate)  Talked with client about pain issues of client  Encouraged client to call RNCM to discuss client's nursing needs  Talked with Vaughan Basta about her current food supply (she said her son helps her obtain needed food Items)   Talked with Vaughan Basta about in home support she received weekly from friend CJ (helps her cook, clean, keep up home)  Talked with client about dental needs of client   Patient Self Care Activities:  . Attends all scheduled provider appointments  . Performs ADL's independently . Contacts [name], caregiver to discuss client needs  Plan:  . Patient will contact LCSW to discuss psychosical needs of client  . LCSW will call client in three weeks to assess client housing needs . Patient will communicate with RN CM to discuss nursing needs of client . Client to call 911 for emergency assistance as needed.  . Client to attend scheduled client medical appointments  Initial goal  documentation   Follow Up Plan: LCSW to call client in next 4 weeks to talk with her about housing needs of  client   The patient verbalized understanding of instructions provided today and declined a print copy of patient instruction materials.   Norva Riffle.Jader Desai MSW, LCSW Licensed Clinical Social Worker [facility] [phone]

## 2019-10-05 ENCOUNTER — Other Ambulatory Visit: Payer: Self-pay

## 2019-10-05 ENCOUNTER — Ambulatory Visit (INDEPENDENT_AMBULATORY_CARE_PROVIDER_SITE_OTHER): Payer: Medicare Other | Admitting: Family Medicine

## 2019-10-05 ENCOUNTER — Encounter: Payer: Self-pay | Admitting: Family Medicine

## 2019-10-05 VITALS — BP 151/92 | HR 83 | Temp 98.8°F | Ht 67.0 in | Wt 183.0 lb

## 2019-10-05 DIAGNOSIS — G4700 Insomnia, unspecified: Secondary | ICD-10-CM | POA: Diagnosis not present

## 2019-10-05 DIAGNOSIS — Z87898 Personal history of other specified conditions: Secondary | ICD-10-CM

## 2019-10-05 DIAGNOSIS — H9193 Unspecified hearing loss, bilateral: Secondary | ICD-10-CM

## 2019-10-05 DIAGNOSIS — Z85118 Personal history of other malignant neoplasm of bronchus and lung: Secondary | ICD-10-CM

## 2019-10-05 DIAGNOSIS — R413 Other amnesia: Secondary | ICD-10-CM

## 2019-10-05 MED ORDER — TRAZODONE HCL 50 MG PO TABS: 25.0000 mg | ORAL_TABLET | Freq: Every evening | ORAL | 2 refills | Status: DC | PRN

## 2019-10-05 MED ORDER — DONEPEZIL HCL 10 MG PO TABS: 10.0000 mg | ORAL_TABLET | Freq: Every day | ORAL | 0 refills | Status: DC

## 2019-10-05 NOTE — Patient Instructions (Addendum)
Please remember to arrive to your appointment 15 minutes prior to your scheduled slot.  If you are late to future visits I may be unable to accommodate your appointment and you may be asked to reschedule.   STOP night time products containing benadryl Trazodone prescribed for sleep  Aricept increased for dementia  Dementia Caregiver Guide Dementia is a term used to describe a number of symptoms that affect memory and thinking. The most common symptoms include:  Memory loss.  Trouble with language and communication.  Trouble concentrating.  Poor judgment.  Problems with reasoning.  Child-like behavior and language.  Extreme anxiety.  Angry outbursts.  Wandering from home or public places. Dementia usually gets worse slowly over time. In the early stages, people with dementia can stay independent and safe with some help. In later stages, they need help with daily tasks such as dressing, grooming, and using the bathroom. How to help the person with dementia cope Dementia can be frightening and confusing. Here are some tips to help the person with dementia cope with changes caused by the disease. General tips  Keep the person on track with his or her routine.  Try to identify areas where the person may need help.  Be supportive, patient, calm, and encouraging.  Gently remind the person that adjusting to changes takes time.  Help with the tasks that the person has asked for help with.  Keep the person involved in daily tasks and decisions as much as possible.  Encourage conversation, but try not to get frustrated or harried if the person struggles to find words or does not seem to appreciate your help. Communication tips  When the person is talking or seems frustrated, make eye contact and hold the person's hand.  Ask specific questions that need yes or no answers.  Use simple words, short sentences, and a calm voice. Only give one direction at a time.  When  offering choices, limit them to just 1 or 2.  Avoid correcting the person in a negative way.  If the person is struggling to find the right words, gently try to help him or her. How to recognize symptoms of stress Symptoms of stress in caregivers include:  Feeling frustrated or angry with the person with dementia.  Denying that the person has dementia or that his or her symptoms will not improve.  Feeling hopeless and unappreciated.  Difficulty sleeping.  Difficulty concentrating.  Feeling anxious, irritable, or depressed.  Developing stress-related health problems.  Feeling like you have too little time for your own life. Follow these instructions at home:   Make sure that you and the person you are caring for: ? Get regular sleep. ? Exercise regularly. ? Eat regular, nutritious meals. ? Drink enough fluid to keep your urine clear or pale yellow. ? Take over-the-counter and prescription medicines only as told by your health care providers. ? Attend all scheduled health care appointments.  Join a support group with others who are caregivers.  Ask about respite care resources so that you can have a regular break from the stress of caregiving.  Look for signs of stress in yourself and in the person you are caring for. If you notice signs of stress, take steps to manage it.  Consider any safety risks and take steps to avoid them.  Organize medications in a pill box for each day of the week.  Create a plan to handle any legal or financial matters. Get legal or financial advice if needed.  Keep a calendar in a central location to remind the person of appointments or other activities. Tips for reducing the risk of injury  Keep floors clear of clutter. Remove rugs, magazine racks, and floor lamps.  Keep hallways well lit, especially at night.  Put a handrail and nonslip mat in the bathtub or shower.  Put childproof locks on cabinets that contain dangerous items, such  as medicines, alcohol, guns, toxic cleaning items, sharp tools or utensils, matches, and lighters.  Put the locks in places where the person cannot see or reach them easily. This will help ensure that the person does not wander out of the house and get lost.  Be prepared for emergencies. Keep a list of emergency phone numbers and addresses in a convenient area.  Remove car keys and lock garage doors so that the person does not try to get in the car and drive.  Have the person wear a bracelet that tracks locations and identifies the person as having memory problems. This should be worn at all times for safety. Where to find support: Many individuals and organizations offer support. These include:  Support groups for people with dementia and for caregivers.  Counselors or therapists.  Home health care services.  Adult day care centers. Where to find more information Alzheimer's Association: [employer] Contact a health care provider if:  The person's health is rapidly getting worse.  You are no longer able to care for the person.  Caring for the person is affecting your physical and emotional health.  The person threatens himself or herself, you, or anyone else. Summary  Dementia is a term used to describe a number of symptoms that affect memory and thinking.  Dementia usually gets worse slowly over time.  Take steps to reduce the person's risk of injury, and to plan for future care.  Caregivers need support, relief from caregiving, and time for their own lives. This information is not intended to replace advice given to you by your health care provider. Make sure you discuss any questions you have with your health care provider. Document Revised: 03/18/2017 Document Reviewed: 03/09/2016 Elsevier Patient Education  2020 [employer].

## 2019-10-05 NOTE — Progress Notes (Signed)
Subjective: CC: memory concern PCP: Loman Brooklyn, FNP YHC:WCBJS DEVINE DANT is a 69 y.o. female who is brought to the office by her son and daughter-in-law.  She is presenting to clinic today for:  1.  Memory problems Patient with ongoing memory problems that have been present for quite a while now though seem to be progressively getting worse over the last 4 years.  She used to drive but stopped driving 4 years ago due to progressive memory problems.  She is able to do some of her ADLs but cannot cook.  She has frequent falls.  Her activity is fairly low, her children state that she sits in a chair most of the day.  Her short-term memory is what mostly seems to be affected.  They denied any behavioral disturbances including visual or auditory hallucinations.  They do note that she has quite a bit of difficulty sleeping and subsequently takes OTC sleep aids.  She is treated with Aricept 5 mg daily.  Her medical history is significant for brain tumor and lung cancer status post chemo radiation.  She is currently residing with her grandson but her son lives only about 10 minutes away.  Additionally, they note decreased hearing would like to get her referred for hearing test.   ROS: Per HPI  Allergies  Allergen Reactions  . Fosamax [Alendronate Sodium] Other (See Comments)    "unknown"  . Sulfonamide Derivatives     REACTION: Hives, tongue swells   Past Medical History:  Diagnosis Date  . Brain cancer ([place])   . Cancer ([place])   . CKD (chronic kidney disease), stage III   . Dementia ([place])   . Diabetic retinopathy ([place])    NPDR OU  . High cholesterol   . Hypertensive retinopathy    OU  . Insomnia   . Lung cancer ([place])   . Osteoporosis     Current Outpatient Medications:  .  donepezil (ARICEPT) 5 MG tablet, Take 1 tablet (5 mg total) by mouth at bedtime., Disp: 90 tablet, Rfl: 3 .  folic acid (FOLVITE) 1 MG tablet, Take 1 tablet (1 mg total) by mouth daily., Disp: 90 tablet, Rfl: 3 .   Ibuprofen-diphenhydrAMINE Cit (ADVIL PM PO), Take by mouth at bedtime., Disp: , Rfl:  .  Multiple Vitamin (MULTIVITAMIN) tablet, Take 1 tablet by mouth daily., Disp: , Rfl:  .  rosuvastatin (CRESTOR) 10 MG tablet, Take 1 tablet (10 mg total) by mouth at bedtime., Disp: 90 tablet, Rfl: 3 Social History   Socioeconomic History  . Marital status: Divorced    Spouse name: Not on file  . Number of children: 2  . Years of education: 9  . Highest education level: 9th grade  Occupational History  . Occupation: retired  Tobacco Use  . Smoking status: Former Smoker    Years: 33.00    Types: Cigarettes    Quit date: 04/20/1999    Years since quitting: 20.4  . Smokeless tobacco: Never Used  Vaping Use  . Vaping Use: Never used  Substance and Sexual Activity  . Alcohol use: Not Currently  . Drug use: No  . Sexual activity: Not Currently  Other Topics Concern  . Not on file  Social History Narrative  . Not on file   Social Determinants of Health   Financial Resource Strain: Low Risk   . Difficulty of Paying Living Expenses: Not hard at all  Food Insecurity: No Food Insecurity  . Worried About Charity fundraiser  in the Last Year: Never true  . Ran Out of Food in the Last Year: Never true  Transportation Needs: No Transportation Needs  . Lack of Transportation (Medical): No  . Lack of Transportation (Non-Medical): No  Physical Activity: Inactive  . Days of Exercise per Week: 0 days  . Minutes of Exercise per Session: 0 min  Stress: Unknown  . Feeling of Stress : Patient refused  Social Connections: Socially Isolated  . Frequency of Communication with Friends and Family: More than three times a week  . Frequency of Social Gatherings with Friends and Family: More than three times a week  . Attends Religious Services: Never  . Active Member of Clubs or Organizations: No  . Attends Archivist Meetings: Never  . Marital Status: Divorced  Human resources officer Violence: Not At  Risk  . Fear of Current or Ex-Partner: No  . Emotionally Abused: No  . Physically Abused: No  . Sexually Abused: No   Family History  Problem Relation Age of Onset  . Cancer Mother        cirrhois of liver  . Heart attack Father   . Colon cancer Father   . Asthma Brother     Objective: Office vital signs reviewed. BP (!) 151/92   Pulse 83   Temp 98.8 F (37.1 C) (Temporal)   Ht 5\' 7"  (1.702 m)   Wt 183 lb (83 kg)   SpO2 93%   BMI 28.66 kg/m   Physical Examination:  General: Awake, alert, chronically ill-appearing female HEENT: Normal; sluggish pupils bilaterally.  EOMI. Cardio: regular rate and rhythm, S1S2 heard, no murmurs appreciated Pulm: clear to auscultation bilaterally, no wheezes, rhonchi or rales; normal work of breathing on room air Extremities: warm, well perfused, No edema, cyanosis or clubbing; +2 pulses bilaterally MSK: Slow gait and station; no cogwheel rigidity appreciated Neuro: Interacts with provider but often looks to her son for answers to questions.  She is pleasant and does not appear to be responding to internal stimuli.  MMSE - Mini Mental State Exam 10/05/2019 12/20/2018 09/20/2014  Not completed: - Unable to complete -  Orientation to time 4 - 5  Orientation to Place 4 - 4  Registration 3 - 3  Attention/ Calculation 3 - 0  Attention/Calculation-comments - - refused  Recall 0 - 2  Language- name 2 objects 2 - 2  Language- repeat 1 - 1  Language- follow 3 step command 2 - 3  Language- read & follow direction 1 - 1  Write a sentence 1 - 0  Write a sentence-comments - - refused  Copy design 1 - 0  Total score 22 - 21     Assessment/ Plan: 69 y.o. female   1. Memory impairment She certainly has mild to moderate memory impairment.  I placed a referral to neurology.  It seems that most of the laboratory work-up has been done previously.  I have increased her Aricept to 10 mg daily.  I have given a handout to the family for caregiver  support. - Ambulatory referral to Neurology - donepezil (ARICEPT) 10 MG tablet; Take 1 tablet (10 mg total) by mouth at bedtime.  Dispense: 90 tablet; Refill: 0  2. Insomnia, unspecified type Discontinue use of OTC sleep aids as these often contain Benadryl which is not recommended.  Low-dose trazodone as needed sleep.  At this time, does not seem to have any behavioral disturbances so no mirtazapine or atypical antipsychotic added.  We will leave this  to the discretion of the neurologist - traZODone (DESYREL) 50 MG tablet; Take 0.5-1 tablets (25-50 mg total) by mouth at bedtime as needed for sleep.  Dispense: 30 tablet; Refill: 2  3. Hearing difficulty of both ears Referral to audiology placed - Ambulatory referral to Audiology  4. History of brain tumor  5. Hx of cancer of lung   No orders of the defined types were placed in this encounter.  No orders of the defined types were placed in this encounter.    Janora Norlander, DO [place] [phone]

## 2019-10-26 ENCOUNTER — Ambulatory Visit: Payer: Medicare Other | Attending: Family Medicine | Admitting: Audiologist

## 2019-10-26 ENCOUNTER — Other Ambulatory Visit: Payer: Self-pay

## 2019-10-26 DIAGNOSIS — H903 Sensorineural hearing loss, bilateral: Secondary | ICD-10-CM | POA: Insufficient documentation

## 2019-10-26 NOTE — Procedures (Signed)
Outpatient Audiology and [place], [place]  [id number] [phone]  AUDIOLOGICAL  EVALUATION  NAME: Jamie Jamie [name]     DOB:   12-27-1950      MRN: [id number]                                                                                     DATE: 10/26/2019     REFERENT: Jamie Brooklyn, FNP STATUS: Outpatient DIAGNOSIS: Sensorineural Hearing Loss Bilateral    History: Jamie Jamie [name] was seen for an audiological evaluation. Jamie Jamie [name] was accompanied to the appointment by Jamie Jamie [name]. Jamie Jamie [name] was the main historian for this appointment.  Jamie Jamie [name] is receiving a hearing evaluation due to concerns for hearing loss. Jamie Jamie [name] has difficulty hearing in most situations. Jamie Jamie [name] some crackling in Jamie ears when she turns Jamie head. No significant or sharp pain or pressure reported in either ear. Tinnitus present in both ears. She does have some significant wax in the right ear.  Jamie medical history is significant for dementia, right parietal metastatic brain tumor treated with brain surgery and chemotherapy, lung cancer treated with chemo radiation, stage three chronic kidney disease, high cholesterol and is a former smoker. All of these conditions are significant risk factors hearing loss. She is currently residing with Jamie grandson.  Evaluation:   Otoscopy showed a clear view of the tympanic membrane in the left ear, and nonoccluding cerumen in the right ear with no view of the tympanic membrane.   Tympanometry results were consistent with normal function of the middle ear, bilaterally    Audiometric testing was completed using conventional audiometry with supraural headphone transducer. Speech Recognition Thresholds were consistent with pure tone averages. Word Recognition was poor in the right ear and fair in the left an elevated level using live voice and very slow presentation. Pure tone thresholds show normal sloping to a severe sensorineural  hearing loss in both ears. Long pauses required between each presentation of the pure tone. Test results are reliable and consistent with severe high frequency hearing loss in both ears. Word recognition is significantly worse for the right ear, this is more likely than not due to the brain tumor and chemo radiation to the brain.   Results:  The test results were reviewed with Jamie Jamie [name] and Jamie Jamie [name]. Hearing loss is severe in both ears. Ability to understand speech is very poor in the right ear. Jamie Jamie [name] was counseled to only use the telephone on Jamie left ear. She reported understanding but is confused about Jamie right and lefts. Due to dementia and limited social contact, hearing aids are not recommended. Jamie Jamie [name] would benefit more from an amplified Caption Call phone, TV Ears amplified headset, and a Pocket Talker for when she needs to hear visitors. See recommendations for how to acquire these personal amplification devices.   Recommendations: 1. Jamie Jamie [name], AuD will send in request and formal documentation of the hearing loss to [facility] Call. Caption Call will then contact Jamie Jamie [name]'s son to set up the phone. Caption Call will set up the amplified phone and teach Jamie to use it.  2. TV Ears can be  purchased online, on [employer], or at [employer]. Jamie [name] will need help setting them up.  3. The following are all amplified headsets that Jamie [name] can easily wear at home or with family to help Jamie hear day to day conversation without the cost or upkeep of a hearing aid. They can all be purchased online.  a. BeHear Access by [employer]  b. [employer] Sound PockeTalker Ultra Duo [employer] with [employer] c. Williams [employer] Ultra 2.0 - Emergency planning/management officer  4. Refer to ENT Physician if right ear develops significant pain, discharge, or sign of infection. Daughter in Jamie [name] reported understanding.    Jamie Jamie [name]  Audiologist, Au.D., CCC-A 10/26/2019  12:21 PM  Cc: Jamie Brooklyn, FNP

## 2019-10-29 ENCOUNTER — Ambulatory Visit: Payer: Medicare HMO | Admitting: Audiologist

## 2019-10-29 ENCOUNTER — Ambulatory Visit (INDEPENDENT_AMBULATORY_CARE_PROVIDER_SITE_OTHER): Payer: Medicare Other | Admitting: Licensed Clinical Social Worker

## 2019-10-29 DIAGNOSIS — Z85118 Personal history of other malignant neoplasm of bronchus and lung: Secondary | ICD-10-CM | POA: Diagnosis not present

## 2019-10-29 DIAGNOSIS — R413 Other amnesia: Secondary | ICD-10-CM

## 2019-10-29 DIAGNOSIS — M810 Age-related osteoporosis without current pathological fracture: Secondary | ICD-10-CM | POA: Diagnosis not present

## 2019-10-29 DIAGNOSIS — Z87898 Personal history of other specified conditions: Secondary | ICD-10-CM

## 2019-10-29 DIAGNOSIS — E785 Hyperlipidemia, unspecified: Secondary | ICD-10-CM | POA: Diagnosis not present

## 2019-10-29 NOTE — Patient Instructions (Addendum)
Licensed Clinical Social Worker Visit Information  Goals we discussed today:      Client said she wants help in improving her living situation and wants to look for other housing options in area. (pt-stated)       Current Barriers:   Financial constraints of client with chronic diagnoses of CKD, Osteoporosis, Hx Cancer of Lung, Hx of Cancer of the brain, Hyperlipidemia, and Memory impairment  Limited social support  Clinical Social Work Clinical Goal(s):   Over next 30 days, client will work with LCSW to address concerns related to housing issues of client and discuss housing options for client in the area.   Interventions:  Talked with Heron Sabins, son of client, about current needs of client  Talked with [name] about sleeping issues of client  Talked with [name] about recent audiological exam of client.   Talked with client previously about  relaxation techniques of choice to help her manage stress issues  Talked with [name] about client upcoming client appointments  Talked with [name] about ambulation needs of client(uses a cane or walker to ambulate)  Talked with [name] about pain issues of client  Encouraged Rodney/client to call RNCM to discuss client's nursing needs  Talked with [name] about dental needs of client  Talked with [name] about appetite of client.  Talked with [name] about [place] document and completion of [place] of Attorney document  Patient Self Care Activities:   Attends all scheduled provider appointments   Performs ADL's independently  Contacts [name], caregiver to discuss client needs  Plan:   Patient will contact LCSW to discuss psychosical needs of client   LCSW will call client in next four weeks to assess client housing needs  Patient will communicate with RN CM to discuss nursing needs of client  Client to call 911 for emergency assistance as needed.   Client to attend  scheduled client medical appointments  Initial goal documentation    Follow Up Plan: LCSW will call client/Rodney Selvey,son, in next 4 weeks to assess client housing needs at that time  Materials Provided: No  The patient/Rodney Mcelwain, son, verbalized understanding of instructions provided today and declined a print copy of patient instruction materials.   Norva Riffle.Shamia Uppal MSW, LCSW Licensed Clinical Social Worker [facility] Care Management [phone]

## 2019-10-29 NOTE — Chronic Care Management (AMB) (Signed)
Chronic Care Management    Clinical Social Work Follow Up Note  10/29/2019 Name: Jamie [name] MRN: [id number] DOB: January 08, 1951  Jamie [name] is a 69 y.o. year old female who is a primary care patient of Loman Brooklyn, FNP. The CCM team was consulted for assistance with [employer] .   Review of patient status, including review of consultants reports, other relevant assessments, and collaboration with appropriate care team members and the patient's provider was performed as part of comprehensive patient evaluation and provision of chronic care management services.    SDOH (Social Determinants of Health) assessments performed: No; risk for social isolation ; risk for tobacco use; risk for physical inactivity    Office Visit from 10/05/2019 in [place]  PHQ-9 Total Score 0       Outpatient Encounter Medications as of 10/29/2019  Medication Sig  . donepezil (ARICEPT) 10 MG tablet Take 1 tablet (10 mg total) by mouth at bedtime.  . folic acid (FOLVITE) 1 MG tablet Take 1 tablet (1 mg total) by mouth daily.  . Ibuprofen-diphenhydrAMINE Cit (ADVIL PM PO) Take by mouth at bedtime.  . Multiple Vitamin (MULTIVITAMIN) tablet Take 1 tablet by mouth daily.  . rosuvastatin (CRESTOR) 10 MG tablet Take 1 tablet (10 mg total) by mouth at bedtime.  . traZODone (DESYREL) 50 MG tablet Take 0.5-1 tablets (25-50 mg total) by mouth at bedtime as needed for sleep.   No facility-administered encounter medications on file as of 10/29/2019.    Goals         .  Client said she wants help in improving her living situation and wants to look for other housing options in area. (pt-stated)      Current Barriers:  . Financial constraints of client with chronic diagnoses of CKD, Osteoporosis, Hx Cancer of Lung, Hx of Cancer of the brain, Hyperlipidemia, and Memory impairment . Limited social support  Clinical Social Work Clinical Goal(s):  Marland Kitchen Over next 30 days, client will work  with LCSW to address concerns related to housing issues of client and discuss housing options for client in the area.   Interventions: . Talked with Jamie [name], son of client, about current needs of client . Talked with Jamie [name] about sleeping issues of client . Talked with Jamie [name] about recent audiological exam of client.  . Talked with client previously about  relaxation techniques of choice to help her manage stress issues  Talked with Jamie [name] about client upcoming client appointments  Talked with Jamie [name] about ambulation needs of client(uses a cane or walker to ambulate)  Talked with Jamie [name] about pain issues of client  Encouraged Jamie [name]/client to call RNCM to discuss client's nursing needs  Talked with Jamie [name] about dental needs of client   Talked with Jamie [name] about appetite of client.  Talked with Jamie [name] about [place] document and completion of [place] document  Patient Self Care Activities:  . Attends all scheduled provider appointments  . Performs ADL's independently . Contacts Jamie [name], caregiver to discuss client needs  Plan:  . Patient will contact LCSW to discuss psychosical needs of client  . LCSW will call client in next four weeks to assess client housing needs . Patient will communicate with RN CM to discuss nursing needs of client . Client to call 911 for emergency assistance as needed.  . Client to attend scheduled client medical appointments  Initial goal documentation    Follow Up Plan: LCSW will  call client in next 4 weeks to assess client housing needs at that time  Norva Riffle.Ardella Chhim MSW, LCSW Licensed Clinical Social Worker [facility] Care Management [phone]

## 2019-11-29 ENCOUNTER — Telehealth: Payer: Medicare Other

## 2019-12-03 ENCOUNTER — Telehealth: Payer: Medicare Other

## 2019-12-03 ENCOUNTER — Telehealth: Payer: Self-pay | Admitting: *Deleted

## 2019-12-03 ENCOUNTER — Other Ambulatory Visit (INDEPENDENT_AMBULATORY_CARE_PROVIDER_SITE_OTHER): Payer: Self-pay | Admitting: Ophthalmology

## 2019-12-03 NOTE — Telephone Encounter (Signed)
  Chronic Care Management   Outreach Note  12/03/2019 Name: Jamie [name] MRN: [id number] DOB: July 27, 1950  Referred by: Loman Brooklyn, FNP Reason for referral : Chronic Care Management (RN follow-up)   An unsuccessful telephone follow-up with patient was attempted today. The patient was referred to the case management team for assistance with care management and care coordination.   Follow Up Plan: The care management team will reach out to the patient again over the next 10 days to schedule an RN follow-up telephone call with patient's son, [name].   [name], BSN, RN-BC Embedded Chronic Care Manager [facility] Direct Dial: [phone]

## 2019-12-06 ENCOUNTER — Telehealth: Payer: Self-pay | Admitting: *Deleted

## 2019-12-06 NOTE — Telephone Encounter (Signed)
Pt is rescheduled for 01/11/2020. Son is only there on Fridays

## 2019-12-06 NOTE — Chronic Care Management (AMB) (Signed)
  Chronic Care Management   Note  12/06/2019 Name: Jamie [name] MRN: [id number] DOB: [birth year]  Jamie [name] is a 69 y.o. year old female who is a primary care patient of Loman Brooklyn, FNP and is actively engaged with the care management team. I reached out to [name] by phone today to assist with re-scheduling a follow up visit with the RN Case Manager.  Follow up plan: Telephone appointment with care management team member scheduled for:01/11/2020  [place], [facility]  [place], [place] [address] Direct Dial: [email or web address]

## 2019-12-26 ENCOUNTER — Emergency Department (HOSPITAL_COMMUNITY): Payer: Medicare Other

## 2019-12-26 ENCOUNTER — Inpatient Hospital Stay (HOSPITAL_COMMUNITY)
Admission: EM | Admit: 2019-12-26 | Discharge: 2019-12-28 | DRG: 177 | Disposition: A | Discharge status: Home Health | Payer: Medicare Other | Attending: Internal Medicine | Admitting: Internal Medicine

## 2019-12-26 ENCOUNTER — Encounter (HOSPITAL_COMMUNITY): Payer: Self-pay | Admitting: *Deleted

## 2019-12-26 ENCOUNTER — Other Ambulatory Visit: Payer: Self-pay

## 2019-12-26 DIAGNOSIS — N1831 Chronic kidney disease, stage 3a: Secondary | ICD-10-CM

## 2019-12-26 DIAGNOSIS — Z888 Allergy status to other drugs, medicaments and biological substances status: Secondary | ICD-10-CM

## 2019-12-26 DIAGNOSIS — Z8249 Family history of ischemic heart disease and other diseases of the circulatory system: Secondary | ICD-10-CM | POA: Diagnosis not present

## 2019-12-26 DIAGNOSIS — E7800 Pure hypercholesterolemia, unspecified: Secondary | ICD-10-CM | POA: Diagnosis present

## 2019-12-26 DIAGNOSIS — E1122 Type 2 diabetes mellitus with diabetic chronic kidney disease: Secondary | ICD-10-CM | POA: Diagnosis present

## 2019-12-26 DIAGNOSIS — H35039 Hypertensive retinopathy, unspecified eye: Secondary | ICD-10-CM | POA: Diagnosis present

## 2019-12-26 DIAGNOSIS — Z882 Allergy status to sulfonamides status: Secondary | ICD-10-CM | POA: Diagnosis not present

## 2019-12-26 DIAGNOSIS — E1140 Type 2 diabetes mellitus with diabetic neuropathy, unspecified: Secondary | ICD-10-CM | POA: Diagnosis present

## 2019-12-26 DIAGNOSIS — G9341 Metabolic encephalopathy: Secondary | ICD-10-CM

## 2019-12-26 DIAGNOSIS — Z85118 Personal history of other malignant neoplasm of bronchus and lung: Secondary | ICD-10-CM

## 2019-12-26 DIAGNOSIS — R4182 Altered mental status, unspecified: Secondary | ICD-10-CM | POA: Diagnosis present

## 2019-12-26 DIAGNOSIS — J1282 Pneumonia due to coronavirus disease 2019: Secondary | ICD-10-CM

## 2019-12-26 DIAGNOSIS — J9601 Acute respiratory failure with hypoxia: Secondary | ICD-10-CM | POA: Diagnosis present

## 2019-12-26 DIAGNOSIS — Z79899 Other long term (current) drug therapy: Secondary | ICD-10-CM | POA: Diagnosis not present

## 2019-12-26 DIAGNOSIS — Z825 Family history of asthma and other chronic lower respiratory diseases: Secondary | ICD-10-CM

## 2019-12-26 DIAGNOSIS — F0390 Unspecified dementia without behavioral disturbance: Secondary | ICD-10-CM

## 2019-12-26 DIAGNOSIS — U071 COVID-19: Principal | ICD-10-CM | POA: Diagnosis present

## 2019-12-26 DIAGNOSIS — E785 Hyperlipidemia, unspecified: Secondary | ICD-10-CM | POA: Diagnosis present

## 2019-12-26 DIAGNOSIS — D72819 Decreased white blood cell count, unspecified: Secondary | ICD-10-CM

## 2019-12-26 DIAGNOSIS — Z87891 Personal history of nicotine dependence: Secondary | ICD-10-CM

## 2019-12-26 DIAGNOSIS — M810 Age-related osteoporosis without current pathological fracture: Secondary | ICD-10-CM | POA: Diagnosis present

## 2019-12-26 DIAGNOSIS — Z800 Family history of malignant neoplasm of digestive organs: Secondary | ICD-10-CM

## 2019-12-26 DIAGNOSIS — Z923 Personal history of irradiation: Secondary | ICD-10-CM

## 2019-12-26 DIAGNOSIS — N183 Chronic kidney disease, stage 3 unspecified: Secondary | ICD-10-CM | POA: Diagnosis present

## 2019-12-26 DIAGNOSIS — Z85841 Personal history of malignant neoplasm of brain: Secondary | ICD-10-CM | POA: Diagnosis not present

## 2019-12-26 DIAGNOSIS — D708 Other neutropenia: Secondary | ICD-10-CM | POA: Diagnosis not present

## 2019-12-26 HISTORY — DX: COVID-19: U07.1

## 2019-12-26 HISTORY — DX: Pneumonia due to coronavirus disease 2019: J12.82

## 2019-12-26 LAB — FIBRINOGEN: Fibrinogen: 456 mg/dL (ref 210–475)

## 2019-12-26 LAB — COMPREHENSIVE METABOLIC PANEL
ALT: 28 U/L (ref 0–44)
AST: 37 U/L (ref 15–41)
Albumin: 4 g/dL (ref 3.5–5.0)
Alkaline Phosphatase: 53 U/L (ref 38–126)
Anion gap: 12 (ref 5–15)
BUN: 18 mg/dL (ref 8–23)
CO2: 24 mmol/L (ref 22–32)
Calcium: 8.4 mg/dL — ABNORMAL LOW (ref 8.9–10.3)
Chloride: 106 mmol/L (ref 98–111)
Creatinine, Ser: 1.14 mg/dL — ABNORMAL HIGH (ref 0.44–1.00)
GFR calc Af Amer: 58 mL/min — ABNORMAL LOW (ref >60)
GFR calc non Af Amer: 50 mL/min — ABNORMAL LOW (ref >60)
Glucose, Bld: 115 mg/dL — ABNORMAL HIGH (ref 70–99)
Potassium: 3.7 mmol/L (ref 3.5–5.1)
Sodium: 142 mmol/L (ref 135–145)
Total Bilirubin: 0.9 mg/dL (ref 0.3–1.2)
Total Protein: 6.8 g/dL (ref 6.5–8.1)

## 2019-12-26 LAB — CBC WITH DIFFERENTIAL/PLATELET
Abs Immature Granulocytes: 0.02 10*3/uL (ref 0.00–0.07)
Basophils Absolute: 0 10*3/uL (ref 0.0–0.1)
Basophils Relative: 0 %
Eosinophils Absolute: 0 10*3/uL (ref 0.0–0.5)
Eosinophils Relative: 0 %
HCT: 44.4 % (ref 36.0–46.0)
Hemoglobin: 14.2 g/dL (ref 12.0–15.0)
Immature Granulocytes: 1 %
Lymphocytes Relative: 14 %
Lymphs Abs: 0.5 10*3/uL — ABNORMAL LOW (ref 0.7–4.0)
MCH: 29.8 pg (ref 26.0–34.0)
MCHC: 32 g/dL (ref 30.0–36.0)
MCV: 93.1 fL (ref 80.0–100.0)
Monocytes Absolute: 0.3 10*3/uL (ref 0.1–1.0)
Monocytes Relative: 7 %
Neutro Abs: 2.9 10*3/uL (ref 1.7–7.7)
Neutrophils Relative %: 78 %
Platelets: 149 10*3/uL — ABNORMAL LOW (ref 150–400)
RBC: 4.77 MIL/uL (ref 3.87–5.11)
RDW: 13 % (ref 11.5–15.5)
WBC: 3.7 10*3/uL — ABNORMAL LOW (ref 4.0–10.5)
nRBC: 0 % (ref 0.0–0.2)

## 2019-12-26 LAB — URINALYSIS, COMPLETE (UACMP) WITH MICROSCOPIC
Bacteria, UA: NONE SEEN
Bilirubin Urine: NEGATIVE
Glucose, UA: NEGATIVE mg/dL
Hgb urine dipstick: NEGATIVE
Ketones, ur: 5 mg/dL — AB
Leukocytes,Ua: NEGATIVE
Nitrite: NEGATIVE
Protein, ur: 100 mg/dL — AB
Specific Gravity, Urine: 1.025 (ref 1.005–1.030)
pH: 5 (ref 5.0–8.0)

## 2019-12-26 LAB — TRIGLYCERIDES: Triglycerides: 132 mg/dL (ref <150)

## 2019-12-26 LAB — RAPID URINE DRUG SCREEN, HOSP PERFORMED
Amphetamines: NOT DETECTED
Barbiturates: NOT DETECTED
Benzodiazepines: NOT DETECTED
Cocaine: NOT DETECTED
Opiates: NOT DETECTED
Tetrahydrocannabinol: NOT DETECTED

## 2019-12-26 LAB — PROTIME-INR
INR: 1.1 (ref 0.8–1.2)
Prothrombin Time: 14 seconds (ref 11.4–15.2)

## 2019-12-26 LAB — ETHANOL: Alcohol, Ethyl (B): <10 mg/dL (ref <10)

## 2019-12-26 LAB — PROCALCITONIN: Procalcitonin: <0.1 ng/mL

## 2019-12-26 LAB — C-REACTIVE PROTEIN: CRP: 1.4 mg/dL — ABNORMAL HIGH (ref <1.0)

## 2019-12-26 LAB — AMMONIA: Ammonia: 21 umol/L (ref 9–35)

## 2019-12-26 LAB — FERRITIN: Ferritin: 306 ng/mL (ref 11–307)

## 2019-12-26 LAB — LACTIC ACID, PLASMA: Lactic Acid, Venous: 0.9 mmol/L (ref 0.5–1.9)

## 2019-12-26 LAB — CBG MONITORING, ED: Glucose-Capillary: 99 mg/dL (ref 70–99)

## 2019-12-26 LAB — SARS CORONAVIRUS 2 BY RT PCR (HOSPITAL ORDER, PERFORMED IN ~~LOC~~ HOSPITAL LAB): SARS Coronavirus 2: POSITIVE — AB

## 2019-12-26 LAB — D-DIMER, QUANTITATIVE: D-Dimer, Quant: 0.65 ug/mL-FEU — ABNORMAL HIGH (ref 0.00–0.50)

## 2019-12-26 LAB — LACTATE DEHYDROGENASE: LDH: 228 U/L — ABNORMAL HIGH (ref 98–192)

## 2019-12-26 MED ORDER — SODIUM CHLORIDE 0.9 % IV SOLN
500.0000 mg | INTRAVENOUS | Status: DC
  Administered 2019-12-26: 500 mg via INTRAVENOUS
  Filled 2019-12-26: qty 500

## 2019-12-26 MED ORDER — SODIUM CHLORIDE 0.9 % IV SOLN: INTRAVENOUS | Status: DC

## 2019-12-26 MED ORDER — ACETAMINOPHEN 650 MG RE SUPP: 650.0000 mg | Freq: Once | RECTAL | Status: DC

## 2019-12-26 MED ORDER — ENOXAPARIN SODIUM 40 MG/0.4ML ~~LOC~~ SOLN: 40.0000 mg | SUBCUTANEOUS | Status: DC

## 2019-12-26 MED ORDER — ACETAMINOPHEN 650 MG RE SUPP
650.0000 mg | Freq: Once | RECTAL | Status: AC
  Administered 2019-12-26: 650 mg via RECTAL

## 2019-12-26 MED ORDER — ACETAMINOPHEN 325 MG PO TABS
650.0000 mg | ORAL_TABLET | Freq: Four times a day (QID) | ORAL | Status: DC | PRN
  Administered 2019-12-27: 650 mg via ORAL
  Filled 2019-12-26: qty 2

## 2019-12-26 MED ORDER — DEXAMETHASONE SODIUM PHOSPHATE 10 MG/ML IJ SOLN
10.0000 mg | Freq: Once | INTRAMUSCULAR | Status: AC
  Administered 2019-12-26: 10 mg via INTRAVENOUS
  Filled 2019-12-26: qty 1

## 2019-12-26 MED ORDER — ASCORBIC ACID 500 MG PO TABS
500.0000 mg | ORAL_TABLET | Freq: Every day | ORAL | Status: DC
  Administered 2019-12-27 – 2019-12-28 (×2): 500 mg via ORAL
  Filled 2019-12-26 (×3): qty 1

## 2019-12-26 MED ORDER — SODIUM CHLORIDE 0.9 % IV SOLN
100.0000 mg | Freq: Every day | INTRAVENOUS | Status: DC
  Administered 2019-12-27 – 2019-12-28 (×2): 100 mg via INTRAVENOUS
  Filled 2019-12-26: qty 20

## 2019-12-26 MED ORDER — SODIUM CHLORIDE 0.9 % IV SOLN
100.0000 mg | INTRAVENOUS | Status: AC
  Administered 2019-12-26 – 2019-12-27 (×2): 100 mg via INTRAVENOUS
  Filled 2019-12-26 (×2): qty 20

## 2019-12-26 MED ORDER — SODIUM CHLORIDE 0.9 % IV SOLN
2.0000 g | INTRAVENOUS | Status: DC
  Administered 2019-12-26: 2 g via INTRAVENOUS
  Filled 2019-12-26: qty 20

## 2019-12-26 MED ORDER — GUAIFENESIN-DM 100-10 MG/5ML PO SYRP: 10.0000 mL | ORAL_SOLUTION | ORAL | Status: DC | PRN

## 2019-12-26 MED ORDER — ZINC SULFATE 220 (50 ZN) MG PO CAPS
220.0000 mg | ORAL_CAPSULE | Freq: Every day | ORAL | Status: DC
  Administered 2019-12-27 – 2019-12-28 (×2): 220 mg via ORAL
  Filled 2019-12-26 (×3): qty 1

## 2019-12-26 MED ORDER — SODIUM CHLORIDE 0.9 % IV BOLUS
1000.0000 mL | Freq: Once | INTRAVENOUS | Status: AC
  Administered 2019-12-26: 1000 mL via INTRAVENOUS

## 2019-12-26 MED ORDER — HYDROCOD POLST-CPM POLST ER 10-8 MG/5ML PO SUER: 5.0000 mL | Freq: Two times a day (BID) | ORAL | Status: DC | PRN

## 2019-12-26 MED ORDER — ALBUTEROL SULFATE HFA 108 (90 BASE) MCG/ACT IN AERS
2.0000 | INHALATION_SPRAY | Freq: Three times a day (TID) | RESPIRATORY_TRACT | Status: DC
  Administered 2019-12-27 – 2019-12-28 (×5): 2 via RESPIRATORY_TRACT

## 2019-12-26 MED ORDER — ALBUTEROL SULFATE HFA 108 (90 BASE) MCG/ACT IN AERS
2.0000 | INHALATION_SPRAY | Freq: Four times a day (QID) | RESPIRATORY_TRACT | Status: DC
  Administered 2019-12-26: 2 via RESPIRATORY_TRACT
  Filled 2019-12-26: qty 6.7

## 2019-12-26 MED ORDER — PANTOPRAZOLE SODIUM 40 MG IV SOLR
40.0000 mg | Freq: Every day | INTRAVENOUS | Status: DC
  Administered 2019-12-26 – 2019-12-27 (×2): 40 mg via INTRAVENOUS
  Filled 2019-12-26 (×2): qty 40

## 2019-12-26 MED ORDER — DEXAMETHASONE SODIUM PHOSPHATE 10 MG/ML IJ SOLN
6.0000 mg | INTRAMUSCULAR | Status: DC
  Administered 2019-12-26: 6 mg via INTRAVENOUS
  Filled 2019-12-26: qty 1

## 2019-12-26 NOTE — ED Triage Notes (Signed)
Pt brought in by [facility] for c/o altered mental status; son is working out of town and has been unable to get in touch with patient, a neighbor went over to check on the patient and the patient was found in urine soaked bed linen and clothes; neighbor told ems that pt can usually walk and is alert and oriented; ems reports is confused and unable to walk; pt has strong smelling urine; cbg 132; pt denies any pain;

## 2019-12-26 NOTE — ED Notes (Signed)
[phone] Jamie [name]

## 2019-12-26 NOTE — Progress Notes (Signed)
Gave patient IS and explained how to use it and how often and purpose.  Patient was able to do 1067ml personal best.  Patient did 10X.

## 2019-12-26 NOTE — Progress Notes (Signed)
Admitting MD notified of current NPO status with po med orders. MD advised to keep NPO.

## 2019-12-26 NOTE — H&P (Addendum)
History and Physical  Jamie [name]:[id number] DOB: [birth year] DOA: 12/26/2019  Referring physician: Margarita Mail, PA-C PCP: Loman Brooklyn, FNP  Patient coming from: Home  Chief Complaint:  Altered mental status  HPI: Jamie [name] is a 69 y.o. female with medical history significant for lung cancer with brain metastasis, dementia, chronic kidney disease stage III, Hyperlipidemia and insomnia who presents to the emergency department via EMS due to altered mental status.  Patient was unable to provide history possibly due to altered mental status and underlying dementia.  History was obtained from ED PA and ED chart.  Per report, patient was apparently being taken care of by daughter-in-law due to son being out of town for work.  Today, son was trying to get in touch with the patient, but no one was picking up the phone, so he asked one of his daughters to check on the patient and she was found to be confused and was soaked in urine and feces in the bed with strong odor of urine.  Patient was reported to be alert and oriented with minor memory problems at baseline and she has 24-hour care.  Patient was reported to have a fall on Saturday when she was trying to pull up her pants in the restroom and was on starting if she had any specific injury, though she seems to be doing well (of note, patient was noted to have subdural hematoma when admitted in 2019).  Patient was reported to be slightly confused on Sunday (9/5), but she appeared to be normal and at baseline when son spoke with her last night.  ED Course:  In the emergency department, she was noted to be febrile with a temperature of 102, she was intermittently mildly tachypneic with RR of 21/minute.  Work-up in the ED showed leukopenia and thrombocytopenia, D-dimer was elevated at 0.65, calcium 8.4, SARS coronavirus 2 was positive.  CT of head without contrast showed no acute intracranial abnormality.  Chest x-ray showed prominent  interstitial lung markings bilaterally which may represent an atypical infectious process or developing edema, small airspace opacity in the left lower lobe lung zone represent atelectasis or infiltrate.  Tylenol was given due to fever.,  Patient was empirically started on IV antibiotics (Rocephin and Zithromax), Decadron was given and hospitalist was asked to admit patient for further evaluation and management.  Review of Systems: This cannot be obtained at this time due to patient's altered mental status   Past Medical History:  Diagnosis Date  . Brain cancer ([place])   . Cancer ([place])   . CKD (chronic kidney disease), stage III   . Dementia ([place])   . Diabetic retinopathy ([place])    NPDR OU  . High cholesterol   . Hypertensive retinopathy    OU  . Insomnia   . Lung cancer ([place])   . Osteoporosis    Past Surgical History:  Procedure Laterality Date  . ABDOMINAL HYSTERECTOMY  1986  . BRAIN SURGERY  2001   TUMOR REMOVAL  . catarac Bilateral   . CATARACT EXTRACTION Bilateral   . EYE SURGERY    . LOBECTOMY      Social History:  reports that she quit smoking about 20 years ago. Her smoking use included cigarettes. She quit after 33.00 years of use. She has never used smokeless tobacco. She reports previous alcohol use. She reports that she does not use drugs.   Allergies  Allergen Reactions  . Fosamax [Alendronate Sodium] Other (See Comments)    "  unknown"  . Sulfonamide Derivatives     REACTION: Hives, tongue swells    Family History  Problem Relation Age of Onset  . Cancer Mother        cirrhois of liver  . Heart attack Father   . Colon cancer Father   . Asthma Brother     Prior to Admission medications   Medication Sig Start Date End Date Taking? Authorizing Provider  donepezil (ARICEPT) 10 MG tablet Take 1 tablet (10 mg total) by mouth at bedtime. 10/05/19  Yes Ronnie Doss M, DO  folic acid (FOLVITE) 1 MG tablet Take 1 tablet (1 mg total) by mouth daily. 07/25/19   Yes Dettinger, Fransisca Kaufmann, MD  magnesium oxide (MAG-OX) 400 MG tablet Take 400 mg by mouth daily.   Yes [provider]  Multiple Vitamin (MULTIVITAMIN) tablet Take 1 tablet by mouth daily.   Yes [provider]  OVER THE COUNTER MEDICATION Take 1 tablet by mouth at bedtime. Sleep 3   Yes [provider]  rosuvastatin (CRESTOR) 10 MG tablet Take 1 tablet (10 mg total) by mouth at bedtime. 07/25/19  Yes Dettinger, Fransisca Kaufmann, MD    Physical Exam: BP 106/63   Pulse 74   Temp 99 F (37.2 C) (Oral)   Resp 17   Ht 5\' 7"  (1.702 m)   Wt 83 kg   SpO2 95%   BMI 28.66 kg/m   . General: 69 y.o. year-old female ill appearing but in no acute distress . HEENT: Normocephalic, atraumatic . Neck: Supple, trachea medial . Cardiovascular: Regular rate and rhythm with no rubs or gallops.  No thyromegaly or JVD noted.  No lower extremity edema. 2/4 pulses in all 4 extremities. Marland Kitchen Respiratory: Clear to auscultation with no wheezes or rales. Good inspiratory effort. . Abdomen: Soft nontender nondistended with normal bowel sounds x4 quadrants. . Muskuloskeletal: No cyanosis, clubbing or edema noted bilaterally . Neuro: Alert and oriented x1 (self).  Patient was able to follow some commands.  Further detailed neurological exam cannot be done at this time due to patient's current status.   . Skin: No ulcerative lesions noted or rashes Psychiatry: This cannot be assessed at this time due to patient's current condition.         Labs on Admission:  Basic Metabolic Panel: Recent Labs  Lab 12/26/19 1639  NA 142  K 3.7  CL 106  CO2 24  GLUCOSE 115*  BUN 18  CREATININE 1.14*  CALCIUM 8.4*   Liver Function Tests: Recent Labs  Lab 12/26/19 1639  AST 37  ALT 28  ALKPHOS 53  BILITOT 0.9  PROT 6.8  ALBUMIN 4.0   No results for input(s): LIPASE, AMYLASE in the last 168 hours. Recent Labs  Lab 12/26/19 1639  AMMONIA 21   CBC: Recent Labs  Lab 12/26/19 1639  WBC 3.7*    NEUTROABS 2.9  HGB 14.2  HCT 44.4  MCV 93.1  PLT 149*   Cardiac Enzymes: No results for input(s): CKTOTAL, CKMB, CKMBINDEX, TROPONINI in the last 168 hours.  BNP (last 3 results) No results for input(s): BNP in the last 8760 hours.  ProBNP (last 3 results) No results for input(s): PROBNP in the last 8760 hours.  CBG: Recent Labs  Lab 12/26/19 1848  GLUCAP 99    Radiological Exams on Admission: CT HEAD WO CONTRAST  Result Date: 12/26/2019 CLINICAL DATA:  Mental status change. Confusion. History of brain tumor post resection. EXAM: CT HEAD WITHOUT CONTRAST TECHNIQUE: Contiguous  axial images were obtained from the base of the skull through the vertex without intravenous contrast. COMPARISON:  Head CT 01/21/2018. FINDINGS: Brain: Stable left frontal postsurgical change with encephalomalacia and craniotomy defect. Previous thin left subdural collection has resolved. No acute hemorrhage. Moderately advanced chronic small vessel ischemia is slightly progressed. No evidence of intracranial mass, mass effect or midline shift. No hydrocephalus. Basilar cisterns are patent. Vascular: No hyperdense vessel. Skull: Calvarium is diffusely heterogeneous but no discrete focal lesion. Prior left frontal craniotomy with stable appearance of bone flap. Sinuses/Orbits: Chronic opacification of lower left mastoid air cells, unchanged. Trace fluid/mucosal thickening in left side of sphenoid sinus, and scattered throughout the ethmoid air cells. Other: None. IMPRESSION: 1. No acute intracranial abnormality. 2. Stable left frontal postsurgical change with encephalomalacia and craniotomy defect. Previous thin left subdural collection has resolved. 3. Moderately advanced chronic small vessel ischemia, slightly progressed from 2019. Electronically Signed   By: Keith Rake M.D.   On: 12/26/2019 16:13   DG Chest Port 1 View  Result Date: 12/26/2019 CLINICAL DATA:  Altered mental status. EXAM: PORTABLE CHEST 1  VIEW COMPARISON:  01/21/2018 FINDINGS: The heart size is unremarkable. Advanced aortic calcifications are noted. There are prominent bilateral interstitial lung markings. There is no pneumothorax. There is a small airspace opacity in the left lower lung zone. There is a chronic appearing deformity of the right humeral head. There is no acute displaced fracture. IMPRESSION: 1. Prominent interstitial lung markings bilaterally which may represent an atypical infectious process or developing edema. 2. Small airspace opacity in the left lower lung zone may represent atelectasis or infiltrate. Electronically Signed   By: Constance Holster M.D.   On: 12/26/2019 15:21    EKG: I independently viewed the EKG done and my findings are as followed: Sinus rhythm at rate of 79 bpm  Assessment/Plan Present on Admission: . Pneumonia due to COVID-19 virus . Hyperlipidemia . Altered mental status . CKD (chronic kidney disease), stage III  Principal Problem:   Pneumonia due to COVID-19 virus Active Problems:   Hyperlipidemia   Altered mental status   CKD (chronic kidney disease), stage III   Hypocalcemia   Leukopenia   Dementia without behavioral disturbance (HCC)   SIRS Positive without meeting sepsis criteria in the setting of COVID-19 virus pneumonia Patient presents with fever of 102 and WBC of 3.7 (meet SIRS criteria), SARS coronavirus was positive for COVID-19 virus infection.  Supplemental oxygen provided for better oxygenation in the ED. Chest x-ray showed prominent interstitial lung markings bilaterally which may represent an atypical infectious process She was initially started on IV antibiotics (ceftriaxone and azithromycin), however procalcitonin was negative, this will be held at this time. Decadron was given in the ED, patient will be started on remdesivir IV hydration per sepsis protocol was provided Continue albuterol q.6h Continue IV Decadron 6 mg daily  Continue IV Remdesivir per  pharmacy protocol Continue vitamin-C 500 mg p.o. Daily Continue zinc 220 mg p.o. Daily Continue Robitussin and Tussionex Continue Tylenol p.r.n. for fever Continue supplemental oxygen to maintain O2 sat > or = 94% with plan to wean patient off supplemental oxygen as tolerated (of note, patient does not use oxygen at baseline) Continue incentive spirometry and flutter valve q32min as tolerated Encourage proning, early ambulation, and side laying as tolerated Continue airborne isolation precaution Inflammatory markers: LDH:  228 D-dimer:0.65 Continue monitoring daily inflammatory markers Physician PPE:  Surgical mask with face shield, N-95, nonsterile gloves, disposable gown, head and shoe cover s  Patient PPE:  Face mask   Altered mental status possibly secondary to above Urine drug screen was negative Urinalysis was negative Ammonia level was normal Continue management as per above Continue fall precaution and neurochecks Patient is currently n.p.o. due to altered mental status.  Hypocalcemia Calcium at 8.4 Consider calcium replenishment once patient is more alert to tolerate oral intake  Dementia Consider home Aricept when patient is more alert to tolerate oral intake  CKD stage III Baseline creatinine 18/1.14 (within baseline range) Renally adjust medications, avoid nephrotoxic agents/dehydration/hypotension  Hyperlipidemia Resume home statin when patient is alert and oriented to be able to tolerate oral intake  History of lung cancer with brain metastasis.  S/p left frontoparietal craniotomy Stable   DVT prophylaxis: SCDs (no chemoprophylaxis at this time due to patient's history of subdural hematoma  Code Status: Full code  Family Communication: None at bedside  Disposition Plan:  Patient is from:                        home Anticipated DC to:                   SNF or family members home Anticipated DC date:               2-3 days Anticipated DC barriers:          Patient requires treatment for COVID-19 virus pneumonia   Consults called: None  Admission status: Inpatient    Bernadette Hoit MD [facility]  If 7PM-7AM, please contact night-coverage [email or web address] Password [facility]  12/26/2019, 8:59 PM

## 2019-12-26 NOTE — Sepsis Progress Note (Signed)
Notified bedside nurse of need to administer antibiotics and fluid bolus.  

## 2019-12-26 NOTE — ED Provider Notes (Signed)
[facility] EMERGENCY DEPARTMENT Provider Note   CSN: [id number] Arrival date & time: 12/26/19  1437     History Chief Complaint  Patient presents with  . Altered Mental Status    Jamie [name] is a 69 y.o. female with a past medical history of dementia, diabetic neuropathy, hypertensive retinopathy, history of lung cancer with metastases to the brain status post radiation therapy.  Patient was brought in by EMS for altered mental status.  History is gathered from EMS report and phone call with the patient's son.  She was under the care of his daughter-in-law who apparently left last evening.  Today he was trying to call the house to check on her and no one was picking up the phone so he sent one of his daughters over to the house where she was found to be very confused, soaked in urine and feces in her bed with strong odor of urine.  The patient's son states that she is normally alert and fairly oriented with only minor memory issues but does have 24-hour care.  She was admitted in 2019 with subdural hematoma and he does state that she had a fall on Saturday after getting up to pull her pants up in the restroom.  He is unsure if she had any specific injuries but seem to be doing well.  Sunday she seemed a little more confused than normal.  He has been out of town working and has not been at the home but does call her daily states that she seemed to be normal last night when he spoke with her as well.  There is a level 5 caveat due to altered mental status the patient is unable to provide any further history. HPI     Past Medical History:  Diagnosis Date  . Brain cancer ([place])   . Cancer ([place])   . CKD (chronic kidney disease), stage III   . Dementia ([place])   . Diabetic retinopathy ([place])    NPDR OU  . High cholesterol   . Hypertensive retinopathy    OU  . Insomnia   . Lung cancer ([place])   . Osteoporosis     Patient Active Problem List   Diagnosis Date Noted  . Seasonal allergic  rhinitis due to pollen 09/06/2018  . Folate deficiency 09/06/2018  . Subdural hematoma ([place]) 01/22/2018  . Acute encephalopathy 01/21/2018  . CKD (chronic kidney disease), stage III 10/31/2017  . Altered mental status 10/30/2017  . Herniated intervertebral disc of lumbar spine 10/24/2017  . Memory loss 02/16/2017  . History of therapeutic radiation 08/03/2016  . Osteoporosis 10/09/2014  . At high risk for falls 10/09/2014  . Hx of cancer of lung 08/01/2014  . Hx of brain cancer 08/01/2014  . Personal history of colonic polyps - adenomas 07/18/2013  . Memory impairment 03/22/2011  . History of cancer 03/22/2011  . Hyperlipidemia 03/22/2011    Past Surgical History:  Procedure Laterality Date  . ABDOMINAL HYSTERECTOMY  1986  . BRAIN SURGERY  2001   TUMOR REMOVAL  . catarac Bilateral   . CATARACT EXTRACTION Bilateral   . EYE SURGERY    . LOBECTOMY       OB History    Gravida  2   Para  2   Term  2   Preterm      AB      Living        SAB      TAB  Ectopic      Multiple      Live Births              Family History  Problem Relation Age of Onset  . Cancer Mother        cirrhois of liver  . Heart attack Father   . Colon cancer Father   . Asthma Brother     Social History   Tobacco Use  . Smoking status: Former Smoker    Years: 33.00    Types: Cigarettes    Quit date: 04/20/1999    Years since quitting: 20.6  . Smokeless tobacco: Never Used  Vaping Use  . Vaping Use: Never used  Substance Use Topics  . Alcohol use: Not Currently  . Drug use: No    Home Medications Prior to Admission medications   Medication Sig Start Date End Date Taking? Authorizing Provider  donepezil (ARICEPT) 10 MG tablet Take 1 tablet (10 mg total) by mouth at bedtime. 10/05/19   Janora Norlander, DO  folic acid (FOLVITE) 1 MG tablet Take 1 tablet (1 mg total) by mouth daily. 07/25/19   Dettinger, Fransisca Kaufmann, MD  Ibuprofen-diphenhydrAMINE Cit (ADVIL PM PO) Take by  mouth at bedtime.    [provider]  Multiple Vitamin (MULTIVITAMIN) tablet Take 1 tablet by mouth daily.    [provider]  rosuvastatin (CRESTOR) 10 MG tablet Take 1 tablet (10 mg total) by mouth at bedtime. 07/25/19   Dettinger, Fransisca Kaufmann, MD  traZODone (DESYREL) 50 MG tablet Take 0.5-1 tablets (25-50 mg total) by mouth at bedtime as needed for sleep. 10/05/19   Janora Norlander, DO    Allergies    Fosamax [alendronate sodium] and Sulfonamide derivatives  Review of Systems   Review of Systems Unable to review systems due to altered mental status Physical Exam Updated Vital Signs BP (!) 141/91   Pulse 86   Temp (!) 102 F (38.9 C) (Rectal)   Resp 20   Ht 5\' 7"  (1.702 m)   Wt 83 kg   SpO2 94%   BMI 28.66 kg/m   Physical Exam Vitals and nursing note reviewed.  Constitutional:      General: She is not in acute distress.    Appearance: She is well-developed. She is ill-appearing. She is not diaphoretic.  HENT:     Head: Normocephalic and atraumatic.  Eyes:     General: No scleral icterus.    Conjunctiva/sclera: Conjunctivae normal.  Cardiovascular:     Rate and Rhythm: Normal rate and regular rhythm.     Heart sounds: Normal heart sounds. No murmur heard.  No friction rub. No gallop.   Pulmonary:     Effort: Pulmonary effort is normal. No respiratory distress.     Breath sounds: Normal breath sounds.  Abdominal:     General: Bowel sounds are normal. There is no distension.     Palpations: Abdomen is soft. There is no mass.     Tenderness: There is no abdominal tenderness. There is no guarding.  Musculoskeletal:     Cervical back: Normal range of motion.  Skin:    General: Skin is warm and dry.  Neurological:     Mental Status: She is alert. She is disoriented and confused.     GCS: GCS eye subscore is 4. GCS verbal subscore is 2. GCS motor subscore is 6.     Comments: Alert and oriented to self only Able to follow some basic commands  Psychiatric:        Behavior: Behavior normal.     ED Results / Procedures / Treatments   Labs (all labs ordered are listed, but only abnormal results are displayed) Labs Reviewed  SARS CORONAVIRUS 2 BY RT PCR (HOSPITAL ORDER, [facility])  URINE CULTURE  CULTURE, BLOOD (ROUTINE X 2)  CULTURE, BLOOD (ROUTINE X 2)  COMPREHENSIVE METABOLIC PANEL  CBC WITH DIFFERENTIAL/PLATELET  URINALYSIS, COMPLETE (UACMP) WITH MICROSCOPIC  AMMONIA  LACTIC ACID, PLASMA  ETHANOL  RAPID URINE DRUG SCREEN, HOSP PERFORMED  PROTIME-INR  CBG MONITORING, ED  I-STAT CHEM 8, ED    EKG None  Radiology No results found.  Procedures Procedures (including critical care time)  Medications Ordered in ED Medications  acetaminophen (TYLENOL) suppository 650 mg (650 mg Rectal Given 12/26/19 1500)    ED Course  I have reviewed the triage vital signs and the nursing notes.  Pertinent labs & imaging results that were available during my care of the patient were reviewed by me and considered in my medical decision making (see chart for details).    MDM Rules/Calculators/A&P                          CC:ams VS: BP (!) 144/87 (BP Location: Right Arm)   Pulse 92   Temp 98.4 F (36.9 C) (Oral)   Resp 20   Ht 5\' 7"  (1.702 m)   Wt 83 kg   SpO2 98%   BMI 28.66 kg/m   NO:BSJGGEZ is gathered by family by phone and EMR. Previous records obtained and reviewed. DDX:The patient's complaint of ams involves an extensive number of diagnostic and treatment options, and is a complaint that carries with it a high risk of complications, morbidity, and potential mortality. Given the large differential diagnosis, medical decision making is of high complexity. The differential diagnosis for AMS is extensive and includes, but is not limited to: drug overdose - opioids, alcohol, sedatives, antipsychotics, drug withdrawal, others; Metabolic: hypoxia, hypoglycemia, hyperglycemia, hypercalcemia,  hypernatremia, hyponatremia, uremia, hepatic encephalopathy, hypothyroidism, hyperthyroidism, vitamin B12 or thiamine deficiency, carbon monoxide poisoning, Wilson's disease, Lactic acidosis, DKA/HHOS; Infectious: meningitis, encephalitis, bacteremia/sepsis, urinary tract infection, pneumonia, neurosyphilis; Structural: Space-occupying lesion, (brain tumor, subdural hematoma, hydrocephalus,); Vascular: stroke, subarachnoid hemorrhage, coronary ischemia, hypertensive encephalopathy, CNS vasculitis, thrombotic thrombocytopenic purpura, disseminated intravascular coagulation, hyperviscosity; Psychiatric: Schizophrenia, depression; Other: Seizure, hypothermia, heat stroke, ICU psychosis, dementia -"sundowning."   Labs: I ordered reviewed and interpreted labs which include CBC which shows mild leukopenia 3.7, CMP with mild elevated creatinine.  No acute other abnormalities, elevated D-dimer and LDH.  Positive Covid test, urine without evidence of abnormality, UDS negative   Imaging: I ordered and reviewed images which included CT head and 1 view chest x-ray. I independently visualized and interpreted all imaging. Significant findings include early pneumonia on chest x-ray, no acute abnormality seen on CT head. EKG: Sinus rhythm at a rate of 79 Consults: Dr. Josephine Cables who will admit the patient to the hospital MDM: Patient here with altered mental status.  Febrile, Covid positive.  Think this is acute encephalopathy secondary to Covid infection.  Patient will be admitted to the hospitalist service. Patient disposition: Admit The patient appears reasonably stabilized for admission considering the current resources, flow, and capabilities available in the ED at this time, and I doubt any other [facility] requiring further screening and/or treatment in the ED prior to admission.   Jamie [name] was evaluated in Emergency  Department on 12/26/2019 for the symptoms described in the history of present illness. She was  evaluated in the context of the global COVID-19 pandemic, which necessitated consideration that the patient might be at risk for infection with the SARS-CoV-2 virus that causes COVID-19. Institutional protocols and algorithms that pertain to the evaluation of patients at risk for COVID-19 are in a state of rapid change based on information released by regulatory bodies including the CDC and federal and state organizations. These policies and algorithms were followed during the patient's care in the ED.      Final Clinical Impression(s) / ED Diagnoses Final diagnoses:  Altered mental status    Rx / DC Orders ED Discharge Orders    None       Margarita Mail, PA-C 12/26/19 2235    Maudie Flakes, MD 12/26/19 [phone]

## 2019-12-27 DIAGNOSIS — J9601 Acute respiratory failure with hypoxia: Secondary | ICD-10-CM

## 2019-12-27 DIAGNOSIS — G9341 Metabolic encephalopathy: Secondary | ICD-10-CM

## 2019-12-27 DIAGNOSIS — D708 Other neutropenia: Secondary | ICD-10-CM

## 2019-12-27 LAB — CBC WITH DIFFERENTIAL/PLATELET
Abs Immature Granulocytes: 0.01 10*3/uL (ref 0.00–0.07)
Basophils Absolute: 0 10*3/uL (ref 0.0–0.1)
Basophils Relative: 0 %
Eosinophils Absolute: 0 10*3/uL (ref 0.0–0.5)
Eosinophils Relative: 0 %
HCT: 40.3 % (ref 36.0–46.0)
Hemoglobin: 12.9 g/dL (ref 12.0–15.0)
Immature Granulocytes: 1 %
Lymphocytes Relative: 25 %
Lymphs Abs: 0.5 10*3/uL — ABNORMAL LOW (ref 0.7–4.0)
MCH: 30.1 pg (ref 26.0–34.0)
MCHC: 32 g/dL (ref 30.0–36.0)
MCV: 94.2 fL (ref 80.0–100.0)
Monocytes Absolute: 0.1 10*3/uL (ref 0.1–1.0)
Monocytes Relative: 6 %
Neutro Abs: 1.3 10*3/uL — ABNORMAL LOW (ref 1.7–7.7)
Neutrophils Relative %: 68 %
Platelets: 130 10*3/uL — ABNORMAL LOW (ref 150–400)
RBC: 4.28 MIL/uL (ref 3.87–5.11)
RDW: 13 % (ref 11.5–15.5)
WBC: 1.8 10*3/uL — ABNORMAL LOW (ref 4.0–10.5)
nRBC: 0 % (ref 0.0–0.2)

## 2019-12-27 LAB — COMPREHENSIVE METABOLIC PANEL
ALT: 27 U/L (ref 0–44)
AST: 32 U/L (ref 15–41)
Albumin: 3.2 g/dL — ABNORMAL LOW (ref 3.5–5.0)
Alkaline Phosphatase: 47 U/L (ref 38–126)
Anion gap: 8 (ref 5–15)
BUN: 13 mg/dL (ref 8–23)
CO2: 25 mmol/L (ref 22–32)
Calcium: 7.8 mg/dL — ABNORMAL LOW (ref 8.9–10.3)
Chloride: 109 mmol/L (ref 98–111)
Creatinine, Ser: 0.85 mg/dL (ref 0.44–1.00)
GFR calc Af Amer: >60 mL/min (ref >60)
GFR calc non Af Amer: >60 mL/min (ref >60)
Glucose, Bld: 139 mg/dL — ABNORMAL HIGH (ref 70–99)
Potassium: 4 mmol/L (ref 3.5–5.1)
Sodium: 142 mmol/L (ref 135–145)
Total Bilirubin: 0.5 mg/dL (ref 0.3–1.2)
Total Protein: 6 g/dL — ABNORMAL LOW (ref 6.5–8.1)

## 2019-12-27 LAB — VITAMIN B12: Vitamin B-12: 224 pg/mL (ref 180–914)

## 2019-12-27 LAB — BRAIN NATRIURETIC PEPTIDE: B Natriuretic Peptide: 82 pg/mL (ref 0.0–100.0)

## 2019-12-27 LAB — HIV ANTIBODY (ROUTINE TESTING W REFLEX): HIV Screen 4th Generation wRfx: NONREACTIVE

## 2019-12-27 LAB — TSH: TSH: 0.38 u[IU]/mL (ref 0.350–4.500)

## 2019-12-27 LAB — FOLATE: Folate: 36.2 ng/mL (ref >5.9)

## 2019-12-27 LAB — T4, FREE: Free T4: 0.79 ng/dL (ref 0.61–1.12)

## 2019-12-27 LAB — URINE CULTURE: Culture: NO GROWTH

## 2019-12-27 LAB — D-DIMER, QUANTITATIVE: D-Dimer, Quant: 0.62 ug/mL-FEU — ABNORMAL HIGH (ref 0.00–0.50)

## 2019-12-27 LAB — FERRITIN: Ferritin: 284 ng/mL (ref 11–307)

## 2019-12-27 LAB — MAGNESIUM: Magnesium: 2.2 mg/dL (ref 1.7–2.4)

## 2019-12-27 LAB — PHOSPHORUS: Phosphorus: 2.8 mg/dL (ref 2.5–4.6)

## 2019-12-27 LAB — C-REACTIVE PROTEIN: CRP: 1.5 mg/dL — ABNORMAL HIGH (ref <1.0)

## 2019-12-27 MED ORDER — METHYLPREDNISOLONE SODIUM SUCC 40 MG IJ SOLR
40.0000 mg | Freq: Two times a day (BID) | INTRAMUSCULAR | Status: DC
  Administered 2019-12-27 – 2019-12-28 (×3): 40 mg via INTRAVENOUS
  Filled 2019-12-27 (×3): qty 1

## 2019-12-27 MED ORDER — ENOXAPARIN SODIUM 40 MG/0.4ML ~~LOC~~ SOLN
40.0000 mg | SUBCUTANEOUS | Status: DC
  Administered 2019-12-27: 40 mg via SUBCUTANEOUS
  Filled 2019-12-27 (×2): qty 0.4

## 2019-12-27 MED ORDER — ENSURE ENLIVE PO LIQD
237.0000 mL | Freq: Two times a day (BID) | ORAL | Status: DC
  Administered 2019-12-27 – 2019-12-28 (×3): 237 mL via ORAL

## 2019-12-27 NOTE — Progress Notes (Signed)
Initial Nutrition Assessment  DOCUMENTATION CODES:   Not applicable  INTERVENTION:  Ensure Enlive po BID, each supplement provides 350 kcal and 20 grams of protein  Magic cup BID with meals, each supplement provides 290 kcal and 9 grams of protein  Liberalize diet  Monitor po intake of meals and supplements  NUTRITION DIAGNOSIS:   Increased nutrient needs related to catabolic illness (pneumonia due to COVID-19 virus) as evidenced by estimated needs.  GOAL:   Patient will meet greater than or equal to 90% of their needs   MONITOR:   Labs, PO intake, Weight trends, Supplement acceptance, I & O's  REASON FOR ASSESSMENT:   Malnutrition Screening Tool    ASSESSMENT:  RD working remotely.  69 year old female with history significant for lung cancer with brain metastasis s/p left frontoparietal craniotomy (July 2001), CKD stage III, HLD, osteoporosis, dementia, and insomnia presented due to AMS after being found by family in bed soaked in urine and feces and confused. Pt noted febrile with temperature 102, intermittently mildly tachypneic, SARS coronavirus was positive and admitted with pneumonia due to COVID-19 virus  Patient noted with ongoing confusion this morning, unable to obtain nutrition history at this time. Diet advanced to [facility] this morning, will continue to monitor for meal intake. Patient is at increased risk for malnutrition and has high nutrition needs given catabolic nature of [id number] virus. Spoke with MD via secure chat, will change diet to regular as well as provide Ensure and Magic Cup supplements to aid with meeting needs.  Per chart, weights stable over the last 3 months.  Medications reviewed and include: Vit C, Methylprednisolone, Zinc sulfate, Remdesivir  Labs: CBG 99, Corrected Ca 8.44 (L), WBC 1.8 (L)   NUTRITION - FOCUSED PHYSICAL EXAM: Unable to complete at this time, RD working remotely.  Diet Order:   Diet Order            Diet Heart Room  service appropriate? Yes; Fluid consistency: Thin  Diet effective now                 EDUCATION NEEDS:   Not appropriate for education at this time  Skin:  Skin Assessment: Reviewed RN Assessment  Last BM:  9/8  Height:   Ht Readings from Last 1 Encounters:  12/26/19 5\' 7"  (1.702 m)    Weight:   Wt Readings from Last 1 Encounters:  12/26/19 83 kg    Ideal Body Weight:  61.4 kg  BMI:  Body mass index is 28.66 kg/m.  Estimated Nutritional Needs:   Kcal:  3875-6433 (26-30 kcal/kg)  Protein:  108-125 (1.3-1.5 g/kg)  Fluid:  >/= 2.1 L/day   Lajuan Lines, RD, LDN Clinical Nutrition After Hours/Weekend Pager # in [place]

## 2019-12-27 NOTE — Progress Notes (Addendum)
PROGRESS NOTE  Jamie [name] EHM:[id number] DOB: 11-Oct-1950 DOA: 12/26/2019 PCP: Loman Brooklyn, FNP  Brief History:  69 year old female with a history of lung cancer with status post frontoparietal craniotomy, CKD stage III, hyperlipidemia, current impairment presenting with altered mental status.  Unfortunately, the patient is unable to provide any significant history.  Attempts are made to contact the family without success on 12/27/2019.  Apparently, the patient was found by family members to be soaked in his urine and feces.  Apparently, the patient is under the care of her son's daughter-in-law who apparently left the patient on the evening of 12/25/2019.  On 12/26/2019, the patient's son was trying to call the house to check up on her and no one was picking up the phone so he sent one of his daughters for fever check up on her, the patient was found to be confused acting her urine and feces.  The patient's son states that she is normally alert and oriented with "only minor memory issues".  He states that the patient did have a fall on 12/23/2019 trying to pull her pants up from the restroom.  On 12/24/2019, the patient seemed a little bit more confused.  The patient's son had been out of town working, but he has been calling her daily, and he stated that she was more alert on the evening of 12/25/2019. In the emergency department, the patient had fever up to 100.0 F, but she was hemodynamically stable.  Oxygen saturation was 91% on room air.  COVID-19 test was positive.  The patient was started on remdesivir and steroids.  Assessment/Plan: Acute respiratory failure with hypoxia secondary to COVID-19 pneumonia -Personally reviewed chest x-ray--increased interstitial markings, left lower lobe opacity/atelectasis -Switch to IV Solu-Medrol -Continue remdesivir -Currently stable on 1 L nasal cannula -Wean oxygen as tolerated -Continue vitamin C and zinc -CRP 1.4>> -Ferritin 306>> -D-dimer 0.65>>  0.62 -ZMO<2.94  Acute metabolic encephalopathy -Secondary to infectious source/COVID-19 -UA negative for pyuria -Check T65 -Check folic acid -Check TSH -remains confused  CKD stage IIIa -Baseline creatinine 0.9-1.1 -A.m. BMP  lung cancer with brain metastasis, status post left frontoparietal craniotomy  -with gross total evacuation of brain tumor July 2001 by Dr. Ellene Route.  Pathology showed metastatic poorly differentiated adenocarcinoma  Cognitive impairment/dementia -Patient is at risk for delirium managed by neurology at [facility]. At baseline walks with a cane, lives with her son, oriented to person, place and situation.  Leukopenia -likely due to viral infection -am CBC with diff       Status is: Inpatient  Remains inpatient appropriate because:IV treatments appropriate due to intensity of illness or inability to take PO Remains confused  Dispo: The patient is from: Home              Anticipated d/c is to: Home              Anticipated d/c date is: 2 days              Patient currently is not medically stable to d/c.        Family Communication:   Called all numbers on list--unable to leave VM  Consultants:  none  Code Status:  FULL   DVT Prophylaxis: [place] Lovenox   Procedures: As Listed in Progress Note Above  Antibiotics: None       Subjective: Patient denies fevers, chills, headache, chest pain, dyspnea, nausea, vomiting, diarrhea, abdominal pain,  Objective: Vitals:   12/27/19 0012 12/27/19 0259 12/27/19 0531 12/27/19 0731  BP: 119/70 127/82 135/69   Pulse: 71 69 66   Resp: 20 20 19    Temp: 98.5 F (36.9 C) 97.8 F (36.6 C) 97.6 F (36.4 C)   TempSrc: Oral Oral Oral   SpO2: 98% 97% 98% 98%  Weight:      Height:        Intake/Output Summary (Last 24 hours) at 12/27/2019 0850 Last data filed at 12/27/2019 0400 Gross per 24 hour  Intake 1550 ml  Output 8 ml  Net 1542 ml   Weight change:  Exam:   General:  Pt is alert,  follows commands appropriately, not in acute distress  HEENT: No icterus, No thrush, No neck mass, [place]/AT  Cardiovascular: RRR, S1/S2, no rubs, no gallops  Respiratory: bibasilar rales. No wheeze  Abdomen: Soft/+BS, non tender, non distended, no guarding  Extremities: No edema, No lymphangitis, No petechiae, No rashes, no synovitis   Data Reviewed: I have personally reviewed following labs and imaging studies Basic Metabolic Panel: Recent Labs  Lab 12/26/19 1639 12/27/19 0723  NA 142 142  K 3.7 4.0  CL 106 109  CO2 24 25  GLUCOSE 115* 139*  BUN 18 13  CREATININE 1.14* 0.85  CALCIUM 8.4* 7.8*  MG  --  2.2  PHOS  --  2.8   Liver Function Tests: Recent Labs  Lab 12/26/19 1639 12/27/19 0723  AST 37 32  ALT 28 27  ALKPHOS 53 47  BILITOT 0.9 0.5  PROT 6.8 6.0*  ALBUMIN 4.0 3.2*   No results for input(s): LIPASE, AMYLASE in the last 168 hours. Recent Labs  Lab 12/26/19 1639  AMMONIA 21   Coagulation Profile: Recent Labs  Lab 12/26/19 1639  INR 1.1   CBC: Recent Labs  Lab 12/26/19 1639 12/27/19 0723  WBC 3.7* 1.8*  NEUTROABS 2.9 1.3*  HGB 14.2 12.9  HCT 44.4 40.3  MCV 93.1 94.2  PLT 149* 130*   Cardiac Enzymes: No results for input(s): CKTOTAL, CKMB, CKMBINDEX, TROPONINI in the last 168 hours. BNP: Invalid input(s): POCBNP CBG: Recent Labs  Lab 12/26/19 1848  GLUCAP 99   HbA1C: No results for input(s): HGBA1C in the last 72 hours. Urine analysis:    Component Value Date/Time   COLORURINE YELLOW 12/26/2019 1839   APPEARANCEUR CLEAR 12/26/2019 1839   LABSPEC 1.025 12/26/2019 1839   PHURINE 5.0 12/26/2019 1839   GLUCOSEU NEGATIVE 12/26/2019 1839   HGBUR NEGATIVE 12/26/2019 1839   BILIRUBINUR NEGATIVE 12/26/2019 1839   KETONESUR 5 (A) 12/26/2019 1839   PROTEINUR 100 (A) 12/26/2019 1839   UROBILINOGEN 0.2 09/08/2011 1045   NITRITE NEGATIVE 12/26/2019 1839   LEUKOCYTESUR NEGATIVE 12/26/2019 1839   Sepsis  Labs: @LABRCNTIP (procalcitonin:4,lacticidven:4) ) Recent Results (from the past 240 hour(s))  SARS Coronavirus 2 by RT PCR (hospital order, performed in [place] hospital lab) Nasopharyngeal Nasopharyngeal Swab     Status: Abnormal   Collection Time: 12/26/19  3:02 PM   Specimen: Nasopharyngeal Swab  Result Value Ref Range Status   SARS Coronavirus 2 POSITIVE (A) NEGATIVE Final    Comment: RESULT CALLED TO, READ BACK BY AND VERIFIED WITH: HOLCUM R. @ 2595 ON [id number] BY HENDERSON L. (NOTE) SARS-CoV-2 target nucleic acids are DETECTED  SARS-CoV-2 RNA is generally detectable in upper respiratory specimens  during the acute phase of infection.  Positive results are indicative  of the presence of the identified virus, but do not rule out bacterial  infection or co-infection with other pathogens not detected by the test.  Clinical correlation with patient history and  other diagnostic information is necessary to determine patient infection status.  The expected result is negative.  Fact Sheet for Patients:   StrictlyIdeas.no   Fact Sheet for Healthcare Providers:   BankingDealers.co.za    This test is not yet approved or cleared by the [place] FDA and  has been authorized for detection and/or diagnosis of SARS-CoV-2 by FDA under an Emergency Use Authorization (EUA).  This EUA will remain in effect (meaning  this test can be used) for the duration of  the COVID-19 declaration under Section 564(b)(1) of the Act, 21 U.S.C. section 360-bbb-3(b)(1), unless the authorization is terminated or revoked sooner.  Performed at [facility], [address] [place], [place] [address]      Scheduled Meds: . albuterol  2 puff Inhalation TID  . vitamin C  500 mg Oral Daily  . methylPREDNISolone (SOLU-MEDROL) injection  40 mg Intravenous Q12H  . pantoprazole (PROTONIX) IV  40 mg Intravenous QHS  . zinc sulfate  220 mg Oral Daily   Continuous  Infusions: . sodium chloride 150 mL/hr at 12/26/19 1849  . azithromycin 500 mg (12/26/19 1850)  . cefTRIAXone (ROCEPHIN)  IV 2 g (12/26/19 1847)  . remdesivir 100 mg in NS 100 mL      Procedures/Studies: CT HEAD WO CONTRAST  Result Date: 12/26/2019 CLINICAL DATA:  Mental status change. Confusion. History of brain tumor post resection. EXAM: CT HEAD WITHOUT CONTRAST TECHNIQUE: Contiguous axial images were obtained from the base of the skull through the vertex without intravenous contrast. COMPARISON:  Head CT 01/21/2018. FINDINGS: Brain: Stable left frontal postsurgical change with encephalomalacia and craniotomy defect. Previous thin left subdural collection has resolved. No acute hemorrhage. Moderately advanced chronic small vessel ischemia is slightly progressed. No evidence of intracranial mass, mass effect or midline shift. No hydrocephalus. Basilar cisterns are patent. Vascular: No hyperdense vessel. Skull: Calvarium is diffusely heterogeneous but no discrete focal lesion. Prior left frontal craniotomy with stable appearance of bone flap. Sinuses/Orbits: Chronic opacification of lower left mastoid air cells, unchanged. Trace fluid/mucosal thickening in left side of sphenoid sinus, and scattered throughout the ethmoid air cells. Other: None. IMPRESSION: 1. No acute intracranial abnormality. 2. Stable left frontal postsurgical change with encephalomalacia and craniotomy defect. Previous thin left subdural collection has resolved. 3. Moderately advanced chronic small vessel ischemia, slightly progressed from 2019. Electronically Signed   By: Keith Rake M.D.   On: 12/26/2019 16:13   DG Chest Port 1 View  Result Date: 12/26/2019 CLINICAL DATA:  Altered mental status. EXAM: PORTABLE CHEST 1 VIEW COMPARISON:  01/21/2018 FINDINGS: The heart size is unremarkable. Advanced aortic calcifications are noted. There are prominent bilateral interstitial lung markings. There is no pneumothorax. There is a  small airspace opacity in the left lower lung zone. There is a chronic appearing deformity of the right humeral head. There is no acute displaced fracture. IMPRESSION: 1. Prominent interstitial lung markings bilaterally which may represent an atypical infectious process or developing edema. 2. Small airspace opacity in the left lower lung zone may represent atelectasis or infiltrate. Electronically Signed   By: Constance Holster M.D.   On: 12/26/2019 15:21    Orson Eva, DO  [facility]  If 7PM-7AM, please contact night-coverage [email or web address] Password [email or web address] 12/27/2019, 8:50 AM   LOS: 1 day

## 2019-12-28 ENCOUNTER — Encounter (INDEPENDENT_AMBULATORY_CARE_PROVIDER_SITE_OTHER): Payer: Medicare Other | Admitting: Ophthalmology

## 2019-12-28 LAB — CBC WITH DIFFERENTIAL/PLATELET
Abs Immature Granulocytes: 0.03 10*3/uL (ref 0.00–0.07)
Basophils Absolute: 0 10*3/uL (ref 0.0–0.1)
Basophils Relative: 0 %
Eosinophils Absolute: 0 10*3/uL (ref 0.0–0.5)
Eosinophils Relative: 0 %
HCT: 42 % (ref 36.0–46.0)
Hemoglobin: 13.5 g/dL (ref 12.0–15.0)
Immature Granulocytes: 1 %
Lymphocytes Relative: 9 %
Lymphs Abs: 0.6 10*3/uL — ABNORMAL LOW (ref 0.7–4.0)
MCH: 30.1 pg (ref 26.0–34.0)
MCHC: 32.1 g/dL (ref 30.0–36.0)
MCV: 93.8 fL (ref 80.0–100.0)
Monocytes Absolute: 0.3 10*3/uL (ref 0.1–1.0)
Monocytes Relative: 5 %
Neutro Abs: 5.6 10*3/uL (ref 1.7–7.7)
Neutrophils Relative %: 85 %
Platelets: 160 10*3/uL (ref 150–400)
RBC: 4.48 MIL/uL (ref 3.87–5.11)
RDW: 12.8 % (ref 11.5–15.5)
WBC: 6.5 10*3/uL (ref 4.0–10.5)
nRBC: 0 % (ref 0.0–0.2)

## 2019-12-28 LAB — COMPREHENSIVE METABOLIC PANEL
ALT: 27 U/L (ref 0–44)
AST: 27 U/L (ref 15–41)
Albumin: 3.3 g/dL — ABNORMAL LOW (ref 3.5–5.0)
Alkaline Phosphatase: 44 U/L (ref 38–126)
Anion gap: 10 (ref 5–15)
BUN: 18 mg/dL (ref 8–23)
CO2: 26 mmol/L (ref 22–32)
Calcium: 8.5 mg/dL — ABNORMAL LOW (ref 8.9–10.3)
Chloride: 107 mmol/L (ref 98–111)
Creatinine, Ser: 0.98 mg/dL (ref 0.44–1.00)
GFR calc Af Amer: >60 mL/min (ref >60)
GFR calc non Af Amer: 60 mL/min — ABNORMAL LOW (ref >60)
Glucose, Bld: 138 mg/dL — ABNORMAL HIGH (ref 70–99)
Potassium: 3.8 mmol/L (ref 3.5–5.1)
Sodium: 143 mmol/L (ref 135–145)
Total Bilirubin: 0.4 mg/dL (ref 0.3–1.2)
Total Protein: 6 g/dL — ABNORMAL LOW (ref 6.5–8.1)

## 2019-12-28 LAB — PHOSPHORUS: Phosphorus: 3.1 mg/dL (ref 2.5–4.6)

## 2019-12-28 LAB — FERRITIN: Ferritin: 586 ng/mL — ABNORMAL HIGH (ref 11–307)

## 2019-12-28 LAB — MAGNESIUM: Magnesium: 2.2 mg/dL (ref 1.7–2.4)

## 2019-12-28 LAB — C-REACTIVE PROTEIN: CRP: 0.7 mg/dL (ref <1.0)

## 2019-12-28 LAB — D-DIMER, QUANTITATIVE: D-Dimer, Quant: 0.62 ug/mL-FEU — ABNORMAL HIGH (ref 0.00–0.50)

## 2019-12-28 MED ORDER — ZINC SULFATE 220 (50 ZN) MG PO CAPS: 220.0000 mg | ORAL_CAPSULE | Freq: Every day | ORAL | Status: DC

## 2019-12-28 MED ORDER — ENSURE ENLIVE PO LIQD: 237.0000 mL | Freq: Two times a day (BID) | ORAL | 12 refills | Status: DC

## 2019-12-28 MED ORDER — PREDNISONE 20 MG PO TABS: 40.0000 mg | ORAL_TABLET | Freq: Two times a day (BID) | ORAL | 0 refills | Status: DC

## 2019-12-28 MED ORDER — ASCORBIC ACID 500 MG PO TABS: 500.0000 mg | ORAL_TABLET | Freq: Every day | ORAL | Status: DC

## 2019-12-28 MED ORDER — CYANOCOBALAMIN 500 MCG PO TABS: 500.0000 ug | ORAL_TABLET | Freq: Every day | ORAL | Status: DC

## 2019-12-28 MED ORDER — VITAMIN B-12 100 MCG PO TABS: 500.0000 ug | ORAL_TABLET | Freq: Every day | ORAL | Status: DC

## 2019-12-28 MED ORDER — CYANOCOBALAMIN 1000 MCG/ML IJ SOLN
1000.0000 ug | Freq: Once | INTRAMUSCULAR | Status: AC
  Administered 2019-12-28: 1000 ug via INTRAMUSCULAR
  Filled 2019-12-28: qty 1

## 2019-12-28 NOTE — Discharge Instructions (Addendum)
Patient scheduled for outpatient Remdesivir infusions at 11am on Saturday 9/11 and Sunday 9/12 at [facility]. Please inform the patient to park at [place], as staff will be escorting the patient through the [place] entrance of the hospital.    There is a wave flag banner located near the entrance on [address]. Turn into this entrance and immediately turn left and park in 1 of the 5 designated Covid Infusion Parking spots. There is a phone number on the sign, please call and let the staff know what spot you are in and we will come out and get you. For questions call [phone].  Thanks.

## 2019-12-28 NOTE — Plan of Care (Signed)

## 2019-12-28 NOTE — Progress Notes (Signed)
Patient scheduled for outpatient Remdesivir infusions at 11am on Saturday 9/11 and Sunday 9/12 at [facility]. Please inform the patient to park at [place], as staff will be escorting the patient through the [place] entrance of the hospital.    There is a wave flag banner located near the entrance on [address]. Turn into this entrance and immediately turn left and park in 1 of the 5 designated Covid Infusion Parking spots. There is a phone number on the sign, please call and let the staff know what spot you are in and we will come out and get you. For questions call [phone].  Thanks.

## 2019-12-28 NOTE — TOC Transition Note (Signed)
Transition of Care [facility]) - CM/SW Discharge Note   Patient Details  Name: PATRA GHERARDI MRN: [id number] Date of Birth: [birth year]  Transition of Care [facility]) CM/SW Contact:  Salome Arnt, [place] Phone Number: 12/28/2019, 12:57 PM   Clinical Narrative:   Home health arranged through [facility]. Son will pick up pt this afternoon.     Final next level of care: [place] Barriers to Discharge: Barriers Resolved   Patient Goals and CMS Choice Patient states their goals for this hospitalization and ongoing recovery are:: return home   Choice offered to / list presented to : Adult Children  Discharge Placement                  Name of family member notified: [name]- son Patient and family notified of of transfer: 12/28/19  Discharge Plan and Services In-house Referral: Clinical Social Work   Post Acute Care Choice: Home Health                    HH Arranged: RN, PT [facility] Agency: [place] ([place]) Date [place]: 12/28/19 Time [place]: 1257 Representative spoke with at [place]: [place] ([place]) Interventions     Readmission Risk Interventions No flowsheet data found.

## 2019-12-28 NOTE — Care Management Important Message (Signed)
Important Message  Patient Details  Name: Jamie [name] MRN: [id number] Date of Birth: 1951-02-11   Medicare Important Message Given:  Yes - Important Message mailed due to current National Emergency     Tommy Medal 12/28/2019, 3:27 PM

## 2019-12-28 NOTE — Progress Notes (Signed)
Pt discharged via [place] to POV with belongings (pajama top) in her possession. Discharge instructions reviewed with pt's son and daughter-in-law, questions answered to their satisfaction.

## 2019-12-28 NOTE — Evaluation (Addendum)
Physical Therapy Evaluation Patient Details Name: Jamie [name] MRN: [id number] DOB: 1950/12/23 Today's Date: 12/28/2019   History of Present Illness  Jamie [name] is a 69 y.o. female with medical history significant for lung cancer with brain metastasis, dementia, chronic kidney disease stage III, Hyperlipidemia and insomnia who presents to the emergency department via EMS due to altered mental status.  Patient was unable to provide history possibly due to altered mental status and underlying dementia.  History was obtained from ED PA and ED chart.  Per report, patient was apparently being taken care of by daughter-in-law due to son being out of town for work.  Today, son was trying to get in touch with the patient, but no one was picking up the phone, so he asked one of his daughters to check on the patient and she was found to be confused and was soaked in urine and feces in the bed with strong odor of urine.  Patient was reported to be alert and oriented with minor memory problems at baseline and she has 24-hour care.  Patient was reported to have a fall on Saturday when she was trying to pull up her pants in the restroom and was on starting if she had any specific injury, though she seems to be doing well (of note, patient was noted to have subdural hematoma when admitted in 2019).  Patient was reported to be slightly confused on Sunday (9/5), but she appeared to be normal and at baseline when son spoke with her last night.     Clinical Impression  Patient demonstrates labored movement for completing sit to stands, requires frequent verbal/tactile cueing for proper hand placement on RW during ambulation with slow labored movement, limited secondary to fatigue, tolerated standing in front of sink to wash hands and put back to bed after therapy.  Patient to be discharged home today and discharged from physical therapy to care of nursing for ambulation daily as tolerated for length of stay.    Follow  Up Recommendations SNF;Supervision for mobility/OOB;Supervision - Intermittent    Equipment Recommendations  None recommended by PT    Recommendations for Other Services       Precautions / Restrictions Precautions Precautions: Fall Restrictions Weight Bearing Restrictions: No      Mobility  Bed Mobility Overal bed mobility: Needs Assistance Bed Mobility: Supine to Sit;Sit to Supine     Supine to sit: Min guard Sit to supine: Min guard   General bed mobility comments: increased time, labored movement  Transfers Overall transfer level: Needs assistance   Transfers: Sit to/from Stand;Stand Pivot Transfers Sit to Stand: Min assist Stand pivot transfers: Min assist       General transfer comment: increased time, labored movement  Ambulation/Gait Ambulation/Gait assistance: Min assist Gait Distance (Feet): 20 Feet Assistive device: Rolling walker (2 wheeled) Gait Pattern/deviations: Decreased step length - right;Decreased step length - left;Decreased stride length Gait velocity: decreased   General Gait Details: slow labored movement, frequent verbal/tactile cueing for proper hand placement on RW with fair carryover, limited secondary to fatigue  Stairs            Wheelchair Mobility    Modified Rankin (Stroke Patients Only)       Balance Overall balance assessment: Needs assistance Sitting-balance support: Feet supported;No upper extremity supported Sitting balance-Leahy Scale: Good Sitting balance - Comments: seated at EOB   Standing balance support: During functional activity;Bilateral upper extremity supported Standing balance-Leahy Scale: Fair Standing balance comment: using RW  Pertinent Vitals/Pain Pain Assessment: No/denies pain    Home Living Family/patient expects to be discharged to:: Private residence Living Arrangements: Children Available Help at Discharge: Family;Available 24 hours/day Type  of Home: Mobile home Home Access: Stairs to enter Entrance Stairs-Rails: Right Entrance Stairs-Number of Steps: 4-5 Home Layout: One level Home Equipment: Walker - 2 wheels;Wheelchair - Regulatory affairs officer - single point      Prior Function Level of Independence: Independent with assistive device(s)         Comments: household ambulator     Hand Dominance   Dominant Hand: Right    Extremity/Trunk Assessment   Upper Extremity Assessment Upper Extremity Assessment: Overall WFL for tasks assessed    Lower Extremity Assessment Lower Extremity Assessment: Generalized weakness    Cervical / Trunk Assessment Cervical / Trunk Assessment: Normal  Communication   Communication: HOH  Cognition Arousal/Alertness: Awake/alert Behavior During Therapy: WFL for tasks assessed/performed Overall Cognitive Status: Within Functional Limits for tasks assessed                                        General Comments      Exercises     Assessment/Plan    PT Assessment All further PT needs can be met in the next venue of care  PT Problem List Decreased strength;Decreased activity tolerance;Decreased balance;Decreased mobility       PT Treatment Interventions      PT Goals (Current goals can be found in the Care Plan section)  Acute Rehab PT Goals Patient Stated Goal: return home with family to assist PT Goal Formulation: With patient Time For Goal Achievement: 12/28/19 Potential to Achieve Goals: Good    Frequency     Barriers to discharge        Co-evaluation               AM-PAC PT "6 Clicks" Mobility  Outcome Measure Help needed turning from your back to your side while in a flat bed without using bedrails?: None Help needed moving from lying on your back to sitting on the side of a flat bed without using bedrails?: A Little Help needed moving to and from a bed to a chair (including a wheelchair)?: A Little Help needed standing up from a  chair using your arms (e.g., wheelchair or bedside chair)?: A Little Help needed to walk in hospital room?: A Lot Help needed climbing 3-5 steps with a railing? : A Lot 6 Click Score: 17    End of Session   Activity Tolerance: Patient tolerated treatment well;Patient limited by fatigue Patient left: in bed;with call bell/phone within reach;with bed alarm set Nurse Communication: Mobility status PT Visit Diagnosis: Unsteadiness on feet (R26.81);Other abnormalities of gait and mobility (R26.89);Muscle weakness (generalized) (M62.81)    Time: 2111-7356 PT Time Calculation (min) (ACUTE ONLY): 30 min   Charges:   PT Evaluation $PT Eval Moderate Complexity: 1 Mod PT Treatments $Therapeutic Activity: 23-37 mins        12:30 PM, 12/28/19 Lonell Grandchild, MPT Physical Therapist with [facility] [phone] office [phone] mobile phone

## 2019-12-28 NOTE — Discharge Summary (Signed)
Physician Discharge Summary  Jamie [name] GUR:[id number] DOB: [birth year] DOA: 12/26/2019  PCP: Loman Brooklyn, FNP  Admit date: 12/26/2019 Discharge date: 12/28/2019  Admitted From: Home Disposition:  Home  Recommendations for Outpatient Follow-up:  1. Follow up with PCP in 1-2 weeks 2. Please obtain BMP/CBC in one week   Home Health: Home Health PT   Discharge Condition: Stable CODE STATUS: FULL Diet recommendation: Regular   Brief/Interim Summary: 69 year old female with a history of lung cancer with status post frontoparietal craniotomy, CKD stage III, hyperlipidemia, current impairment presenting with altered mental status.  Unfortunately, the patient is unable to provide any significant history.  Attempts are made to contact the family without success on 12/27/2019.  Apparently, the patient was found by family members to be soaked in his urine and feces.  Apparently, the patient is under the care of her son's daughter-in-law who apparently left the patient on the evening of 12/25/2019.  On 12/26/2019, the patient's son was trying to call the house to check up on her and no one was picking up the phone so he sent one of his daughters for fever check up on her, the patient was found to be confused acting her urine and feces.  The patient's son states that she is normally alert and oriented with "only minor memory issues".  He states that the patient did have a fall on 12/23/2019 trying to pull her pants up from the restroom.  On 12/24/2019, the patient seemed a little bit more confused.  The patient's son had been out of town working, but he has been calling her daily, and he stated that she was more alert on the evening of 12/25/2019. In the emergency department, the patient had fever up to 100.0 F, but she was hemodynamically stable.  Oxygen saturation was 91% on room air.  COVID-19 test was positive.  The patient was started on remdesivir and steroids.  Discharge Diagnoses:  Acute respiratory  failure with hypoxia secondary to COVID-19 pneumonia -Personally reviewed chest x-ray--increased interstitial markings, left lower lobe opacity/atelectasis -Switch to IV Solu-Medrol -Continue remdesivir -Currently stable on 1 L nasal cannula>>RA -Wean oxygen as tolerated -Continue vitamin C and zinc -CRP 1.4>>0.7 -Ferritin 306>>586 -D-dimer 0.65>> 0.62>>0.62 -PCT<0.10 -outpatient remdesivir infusions set up for 9/11 and 9/12 to finish 5 days--11AM both days -d/c home with 7 more days prednisone -no desaturation with ambulation  Acute metabolic encephalopathy -Secondary to infectious source/COVID-19 -UA negative for pyuria -Check E83--151 -Check folic VOHY--07.3 -Check TSH--0.380 -9/10 improved, near baseline  CKD stage IIIa -Baseline creatinine 0.9-1.1 -A.m. BMP  lung cancer with brain metastasis, status post left frontoparietal craniotomy -with gross total evacuation of brain tumor July 2001 by Dr. Ellene Route. Pathology showed metastatic poorly differentiated adenocarcinoma  Cognitive impairment/dementia -Patient is at risk for delirium managed by neurology at [facility]. At baseline walks with a cane, lives with her son, oriented to person, place and situation. -continue Aricept  Leukopenia -likely due to viral infection -improved  Discharge Instructions   Allergies as of 12/28/2019      Reactions   Fosamax [alendronate Sodium] Other (See Comments)   "unknown"   Sulfonamide Derivatives    REACTION: Hives, tongue swells      Medication List    TAKE these medications   ascorbic acid 500 MG tablet Commonly known as: VITAMIN C Take 1 tablet (500 mg total) by mouth daily. Start taking on: December 29, 2019   donepezil 10 MG tablet Commonly known as: Aricept Take 1  tablet (10 mg total) by mouth at bedtime.   feeding supplement (ENSURE ENLIVE) Liqd Take 237 mLs by mouth 2 (two) times daily between meals.   folic acid 1 MG tablet Commonly known as:  FOLVITE Take 1 tablet (1 mg total) by mouth daily.   magnesium oxide 400 MG tablet Commonly known as: MAG-OX Take 400 mg by mouth daily.   multivitamin tablet Take 1 tablet by mouth daily.   OVER THE COUNTER MEDICATION Take 1 tablet by mouth at bedtime. Sleep 3   predniSONE 20 MG tablet Commonly known as: DELTASONE Take 2 tablets (40 mg total) by mouth 2 (two) times daily with a meal.   rosuvastatin 10 MG tablet Commonly known as: CRESTOR Take 1 tablet (10 mg total) by mouth at bedtime.   vitamin B-12 500 MCG tablet Commonly known as: CYANOCOBALAMIN Take 1 tablet (500 mcg total) by mouth daily. Start taking on: December 29, 2019   zinc sulfate 220 (50 Zn) MG capsule Take 1 capsule (220 mg total) by mouth daily. Start taking on: December 29, 2019       Allergies  Allergen Reactions  . Fosamax [Alendronate Sodium] Other (See Comments)    "unknown"  . Sulfonamide Derivatives     REACTION: Hives, tongue swells    Consultations:  none   Procedures/Studies: CT HEAD WO CONTRAST  Result Date: 12/26/2019 CLINICAL DATA:  Mental status change. Confusion. History of brain tumor post resection. EXAM: CT HEAD WITHOUT CONTRAST TECHNIQUE: Contiguous axial images were obtained from the base of the skull through the vertex without intravenous contrast. COMPARISON:  Head CT 01/21/2018. FINDINGS: Brain: Stable left frontal postsurgical change with encephalomalacia and craniotomy defect. Previous thin left subdural collection has resolved. No acute hemorrhage. Moderately advanced chronic small vessel ischemia is slightly progressed. No evidence of intracranial mass, mass effect or midline shift. No hydrocephalus. Basilar cisterns are patent. Vascular: No hyperdense vessel. Skull: Calvarium is diffusely heterogeneous but no discrete focal lesion. Prior left frontal craniotomy with stable appearance of bone flap. Sinuses/Orbits: Chronic opacification of lower left mastoid air cells,  unchanged. Trace fluid/mucosal thickening in left side of sphenoid sinus, and scattered throughout the ethmoid air cells. Other: None. IMPRESSION: 1. No acute intracranial abnormality. 2. Stable left frontal postsurgical change with encephalomalacia and craniotomy defect. Previous thin left subdural collection has resolved. 3. Moderately advanced chronic small vessel ischemia, slightly progressed from 2019. Electronically Signed   By: Keith Rake M.D.   On: 12/26/2019 16:13   DG Chest Port 1 View  Result Date: 12/26/2019 CLINICAL DATA:  Altered mental status. EXAM: PORTABLE CHEST 1 VIEW COMPARISON:  01/21/2018 FINDINGS: The heart size is unremarkable. Advanced aortic calcifications are noted. There are prominent bilateral interstitial lung markings. There is no pneumothorax. There is a small airspace opacity in the left lower lung zone. There is a chronic appearing deformity of the right humeral head. There is no acute displaced fracture. IMPRESSION: 1. Prominent interstitial lung markings bilaterally which may represent an atypical infectious process or developing edema. 2. Small airspace opacity in the left lower lung zone may represent atelectasis or infiltrate. Electronically Signed   By: Constance Holster M.D.   On: 12/26/2019 15:21        Discharge Exam: Vitals:   12/28/19 0611 12/28/19 0813  BP: (!) 132/58   Pulse: 65   Resp: 16   Temp: 97.8 F (36.6 C)   SpO2: 97% 96%   Vitals:   12/27/19 2025 12/27/19 2054 12/28/19 3267 12/28/19 0813  BP: 122/68  (!) 132/58   Pulse: 70  65   Resp: 16  16   Temp: 99.1 F (37.3 C)  97.8 F (36.6 C)   TempSrc: Oral  Oral   SpO2: 96% 96% 97% 96%  Weight:      Height:        General: Pt is alert, awake, not in acute distress Cardiovascular: RRR, S1/S2 +, no rubs, no gallops Respiratory: bibasilar rales. No wheeze Abdominal: Soft, NT, ND, bowel sounds + Extremities: no edema, no cyanosis   The results of significant diagnostics from  this hospitalization (including imaging, microbiology, ancillary and laboratory) are listed below for reference.    Significant Diagnostic Studies: CT HEAD WO CONTRAST  Result Date: 12/26/2019 CLINICAL DATA:  Mental status change. Confusion. History of brain tumor post resection. EXAM: CT HEAD WITHOUT CONTRAST TECHNIQUE: Contiguous axial images were obtained from the base of the skull through the vertex without intravenous contrast. COMPARISON:  Head CT 01/21/2018. FINDINGS: Brain: Stable left frontal postsurgical change with encephalomalacia and craniotomy defect. Previous thin left subdural collection has resolved. No acute hemorrhage. Moderately advanced chronic small vessel ischemia is slightly progressed. No evidence of intracranial mass, mass effect or midline shift. No hydrocephalus. Basilar cisterns are patent. Vascular: No hyperdense vessel. Skull: Calvarium is diffusely heterogeneous but no discrete focal lesion. Prior left frontal craniotomy with stable appearance of bone flap. Sinuses/Orbits: Chronic opacification of lower left mastoid air cells, unchanged. Trace fluid/mucosal thickening in left side of sphenoid sinus, and scattered throughout the ethmoid air cells. Other: None. IMPRESSION: 1. No acute intracranial abnormality. 2. Stable left frontal postsurgical change with encephalomalacia and craniotomy defect. Previous thin left subdural collection has resolved. 3. Moderately advanced chronic small vessel ischemia, slightly progressed from 2019. Electronically Signed   By: Keith Rake M.D.   On: 12/26/2019 16:13   DG Chest Port 1 View  Result Date: 12/26/2019 CLINICAL DATA:  Altered mental status. EXAM: PORTABLE CHEST 1 VIEW COMPARISON:  01/21/2018 FINDINGS: The heart size is unremarkable. Advanced aortic calcifications are noted. There are prominent bilateral interstitial lung markings. There is no pneumothorax. There is a small airspace opacity in the left lower lung zone. There is a  chronic appearing deformity of the right humeral head. There is no acute displaced fracture. IMPRESSION: 1. Prominent interstitial lung markings bilaterally which may represent an atypical infectious process or developing edema. 2. Small airspace opacity in the left lower lung zone may represent atelectasis or infiltrate. Electronically Signed   By: Constance Holster M.D.   On: 12/26/2019 15:21     Microbiology: Recent Results (from the past 240 hour(s))  SARS Coronavirus 2 by RT PCR (hospital order, performed in [facility] hospital lab) Nasopharyngeal Nasopharyngeal Swab     Status: Abnormal   Collection Time: 12/26/19  3:02 PM   Specimen: Nasopharyngeal Swab  Result Value Ref Range Status   SARS Coronavirus 2 POSITIVE (A) NEGATIVE Final    Comment: RESULT CALLED TO, READ BACK BY AND VERIFIED WITH: HOLCUM R. @ [id number] ON [id number] BY HENDERSON L. (NOTE) SARS-CoV-2 target nucleic acids are DETECTED  SARS-CoV-2 RNA is generally detectable in upper respiratory specimens  during the acute phase of infection.  Positive results are indicative  of the presence of the identified virus, but do not rule out bacterial infection or co-infection with other pathogens not detected by the test.  Clinical correlation with patient history and  other diagnostic information is necessary to determine patient infection status.  The expected  result is negative.  Fact Sheet for Patients:   StrictlyIdeas.no   Fact Sheet for Healthcare Providers:   BankingDealers.co.za    This test is not yet approved or cleared by the [place] FDA and  has been authorized for detection and/or diagnosis of SARS-CoV-2 by FDA under an Emergency Use Authorization (EUA).  This EUA will remain in effect (meaning  this test can be used) for the duration of  the COVID-19 declaration under Section 564(b)(1) of the Act, 21 U.S.C. section 360-bbb-3(b)(1), unless the authorization  is terminated or revoked sooner.  Performed at [facility], [address] [place], [place] [address]   Blood Cultures (routine x 2)     Status: None (Preliminary result)   Collection Time: 12/26/19  4:41 PM   Specimen: BLOOD  Result Value Ref Range Status   Specimen Description BLOOD LEFT ANTECUBITAL  Final   Special Requests   Final    BOTTLES DRAWN AEROBIC AND ANAEROBIC Blood Culture adequate volume   Culture   Final    NO GROWTH 2 DAYS Performed at [facility], [address] [place], [place] [address]    Report Status PENDING  Incomplete  Blood Cultures (routine x 2)     Status: None (Preliminary result)   Collection Time: 12/26/19  4:41 PM   Specimen: BLOOD LEFT HAND  Result Value Ref Range Status   Specimen Description BLOOD LEFT HAND  Final   Special Requests   Final    BOTTLES DRAWN AEROBIC AND ANAEROBIC Blood Culture adequate volume   Culture   Final    NO GROWTH 2 DAYS Performed at [facility], [address], [place], [place] [address]    Report Status PENDING  Incomplete  Culture, blood (single)     Status: None (Preliminary result)   Collection Time: 12/26/19  6:06 PM   Specimen: BLOOD  Result Value Ref Range Status   Specimen Description BLOOD LEFT ANTECUBITAL  Final   Special Requests   Final    BOTTLES DRAWN AEROBIC AND ANAEROBIC Blood Culture adequate volume   Culture   Final    NO GROWTH 2 DAYS Performed at [facility], [address], [place], [place] [address]    Report Status PENDING  Incomplete  Urine culture     Status: None   Collection Time: 12/26/19  6:39 PM   Specimen: Urine, Catheterized  Result Value Ref Range Status   Specimen Description   Final    URINE, CATHETERIZED Performed at [facility], [address] [place], [place] [address]    Special Requests   Final    Immunocompromised Performed at [facility], [address] [place], [place] [address]    Culture   Final    NO GROWTH Performed at [facility], [place]  [address], [place], [place] [address]    Report Status 12/27/2019 FINAL  Final     Labs: Basic Metabolic Panel: Recent Labs  Lab 12/26/19 1639 12/26/19 1639 12/27/19 0723 12/28/19 0803  NA 142  --  142 143  K 3.7   < > 4.0 3.8  CL 106  --  109 107  CO2 24  --  25 26  GLUCOSE 115*  --  139* 138*  BUN 18  --  13 18  CREATININE 1.14*  --  0.85 0.98  CALCIUM 8.4*  --  7.8* 8.5*  MG  --   --  2.2 2.2  PHOS  --   --  2.8 3.1   < > =  values in this interval not displayed.   Liver Function Tests: Recent Labs  Lab 12/26/19 1639 12/27/19 0723 12/28/19 0803  AST 37 32 27  ALT 28 27 27   ALKPHOS 53 47 44  BILITOT 0.9 0.5 0.4  PROT 6.8 6.0* 6.0*  ALBUMIN 4.0 3.2* 3.3*   No results for input(s): LIPASE, AMYLASE in the last 168 hours. Recent Labs  Lab 12/26/19 1639  AMMONIA 21   CBC: Recent Labs  Lab 12/26/19 1639 12/27/19 0723 12/28/19 0803  WBC 3.7* 1.8* 6.5  NEUTROABS 2.9 1.3* 5.6  HGB 14.2 12.9 13.5  HCT 44.4 40.3 42.0  MCV 93.1 94.2 93.8  PLT 149* 130* 160   Cardiac Enzymes: No results for input(s): CKTOTAL, CKMB, CKMBINDEX, TROPONINI in the last 168 hours. BNP: Invalid input(s): POCBNP CBG: Recent Labs  Lab 12/26/19 1848  GLUCAP 99    Time coordinating discharge:  36 minutes  Signed:  Orson Eva, DO [facility] Pager: [phone] 12/28/2019, 11:49 AM

## 2019-12-28 NOTE — TOC Initial Note (Signed)
Transition of Care [facility]) - Initial/Assessment Note    Patient Details  Name: Jamie [name] MRN: [id number] Date of Birth: [birth year]  Transition of Care [facility]) CM/SW Contact:    Salome Arnt, [place] Phone Number: 12/28/2019, 12:51 PM  Clinical Narrative: Pt admitted due to acute respiratory failure with hypoxia secondary to COVID-19 pneumonia. Pt from home and has around the clock care from family. LCSW spoke with pt's son, [name] who reports he is HCPOA. Pt has diagnosis of dementia and relies on family to assist with care. LCSW discussed PT recommendations with [name] who states pt has been to [facility] in the past and was not happy at [facility]. He feels that pt would want to return home and he feels family will be able to assist as needed. They are open to home health and agreeable to referral to [facility]. LCSW referred to [facility] for PT/RN. Pt has Remdesivir infusion appointments on Saturday and Sunday at [place] notified and can transport. Information printed off by LCSW for RN to give at d/c. [name] requested that LCSW add his fiance, Theadora Rama to contact list as she is one of primary caregivers.                    Expected Discharge Plan: [place] Barriers to Discharge: Barriers Resolved   Patient Goals and CMS Choice Patient states their goals for this hospitalization and ongoing recovery are:: return home   Choice offered to / list presented to : Adult Children  Expected Discharge Plan and Services Expected Discharge Plan: [place] In-house Referral: Clinical Social Work   Post Acute Care Choice: [place] arrangements for the past 2 months: Mobile Home Expected Discharge Date: 12/28/19                         HH Arranged: RN, PT [place] Agency: [place] ([place]) Date [place]: 12/28/19 Time [place]: 1250 Representative spoke with at [place]: Romualdo Bolk  Prior Living  Arrangements/Services Living arrangements for the past 2 months: Mobile Home Lives with:: Relatives Patient language and need for interpreter reviewed:: No Do you feel safe going back to the place where you live?: Yes      Need for Family Participation in Patient Care: Yes (Comment) Care giver support system in place?: Yes (comment) Current home services: DME (cane, walker, wheelchair, 3N1) Criminal Activity/Legal Involvement Pertinent to Current Situation/Hospitalization: No - Comment as needed  Activities of Daily Living Home Assistive Devices/Equipment: Walker (specify type) ADL Screening (condition at time of admission) Patient's cognitive ability adequate to safely complete daily activities?: No Is the patient deaf or have difficulty hearing?: No Does the patient have difficulty seeing, even when wearing glasses/contacts?: No Does the patient have difficulty concentrating, remembering, or making decisions?: Yes Patient able to express need for assistance with ADLs?: No Does the patient have difficulty dressing or bathing?: Yes Independently performs ADLs?: No Communication: Needs assistance, Independent (pt has to be prompted ) Is this a change from baseline?: Pre-admission baseline Dressing (OT): Needs assistance Is this a change from baseline?: Pre-admission baseline Grooming: Needs assistance Is this a change from baseline?: Pre-admission baseline Feeding: Independent Bathing: Needs assistance Is this a change from baseline?: Pre-admission baseline Toileting: Needs assistance Is this a change from baseline?: Pre-admission baseline In/Out Bed: Needs assistance Is this a change from baseline?: Pre-admission baseline Walks in Home: Needs  assistance (with walker) Is this a change from baseline?: Pre-admission baseline Does the patient have difficulty walking or climbing stairs?: Yes Weakness of Legs: Both Weakness of Arms/Hands: None  Permission Sought/Granted                   Emotional Assessment   Attitude/Demeanor/Rapport: Unable to Assess Affect (typically observed): Unable to Assess   Alcohol / Substance Use: Not Applicable Psych Involvement: No (comment)  Admission diagnosis:  Altered mental status [E39.53] Acute metabolic encephalopathy [U02.33] Pneumonia due to COVID-19 virus [U07.1, J12.82] Patient Active Problem List   Diagnosis Date Noted  . Acute respiratory failure with hypoxia ([place]) 12/27/2019  . Acute metabolic encephalopathy   . Pneumonia due to COVID-19 virus 12/26/2019  . Hypocalcemia 12/26/2019  . Leukopenia 12/26/2019  . Dementia without behavioral disturbance ([place]) 12/26/2019  . Seasonal allergic rhinitis due to pollen 09/06/2018  . Folate deficiency 09/06/2018  . Subdural hematoma ([place]) 01/22/2018  . Acute encephalopathy 01/21/2018  . CKD (chronic kidney disease), stage III 10/31/2017  . Altered mental status 10/30/2017  . Herniated intervertebral disc of lumbar spine 10/24/2017  . Memory loss 02/16/2017  . History of therapeutic radiation 08/03/2016  . Osteoporosis 10/09/2014  . At high risk for falls 10/09/2014  . Hx of cancer of lung 08/01/2014  . Hx of brain cancer 08/01/2014  . Personal history of colonic polyps - adenomas 07/18/2013  . Memory impairment 03/22/2011  . History of cancer 03/22/2011  . Hyperlipidemia 03/22/2011   PCP:  Loman Brooklyn, FNP Pharmacy:   [facility], [address] [place] [address] Phone: [phone] Fax: [phone]     Social Determinants of Health (SDOH) Interventions    Readmission Risk Interventions No flowsheet data found.

## 2019-12-29 ENCOUNTER — Ambulatory Visit (HOSPITAL_COMMUNITY)
Admission: RE | Admit: 2019-12-29 | Discharge: 2019-12-29 | Disposition: A | Discharge status: Home / Self Care | Payer: Medicare Other | Source: Ambulatory Visit | Attending: Pulmonary Disease | Admitting: Pulmonary Disease

## 2019-12-29 DIAGNOSIS — U071 COVID-19: Secondary | ICD-10-CM | POA: Diagnosis not present

## 2019-12-29 DIAGNOSIS — J1282 Pneumonia due to coronavirus disease 2019: Secondary | ICD-10-CM | POA: Diagnosis not present

## 2019-12-29 MED ORDER — DIPHENHYDRAMINE HCL 50 MG/ML IJ SOLN: 50.0000 mg | Freq: Once | INTRAMUSCULAR | Status: DC | PRN

## 2019-12-29 MED ORDER — ALBUTEROL SULFATE HFA 108 (90 BASE) MCG/ACT IN AERS: 2.0000 | INHALATION_SPRAY | Freq: Once | RESPIRATORY_TRACT | Status: DC | PRN

## 2019-12-29 MED ORDER — FAMOTIDINE IN NACL 20-0.9 MG/50ML-% IV SOLN: 20.0000 mg | Freq: Once | INTRAVENOUS | Status: DC | PRN

## 2019-12-29 MED ORDER — SODIUM CHLORIDE 0.9 % IV SOLN: INTRAVENOUS | Status: DC | PRN

## 2019-12-29 MED ORDER — EPINEPHRINE 0.3 MG/0.3ML IJ SOAJ: 0.3000 mg | Freq: Once | INTRAMUSCULAR | Status: DC | PRN

## 2019-12-29 MED ORDER — SODIUM CHLORIDE 0.9 % IV SOLN
100.0000 mg | Freq: Once | INTRAVENOUS | Status: AC
  Administered 2019-12-29: 100 mg via INTRAVENOUS
  Filled 2019-12-29: qty 20

## 2019-12-29 MED ORDER — METHYLPREDNISOLONE SODIUM SUCC 125 MG IJ SOLR: 125.0000 mg | Freq: Once | INTRAMUSCULAR | Status: DC | PRN

## 2019-12-29 NOTE — Progress Notes (Signed)
  Diagnosis: COVID-19  Physician: Dr. Joya Gaskins  Procedure: [facility] Med: remdesivir infusion - Provided patient with remdesivir fact sheet for patients, parents and caregivers prior to infusion.  Complications: No immediate complications noted.  Discharge: Discharged home   [place] 12/29/2019

## 2019-12-29 NOTE — Discharge Instructions (Signed)
10 Things You Can Do to Manage Your COVID-19 Symptoms at Home If you have possible or confirmed COVID-19: 1. Stay home from work and school. And stay away from other public places. If you must go out, avoid using any kind of public transportation, ridesharing, or taxis. 2. Monitor your symptoms carefully. If your symptoms get worse, call your healthcare provider immediately. 3. Get rest and stay hydrated. 4. If you have a medical appointment, call the healthcare provider ahead of time and tell them that you have or may have COVID-19. 5. For medical emergencies, call 911 and notify the dispatch personnel that you have or may have COVID-19. 6. Cover your cough and sneezes with a tissue or use the inside of your elbow. 7. Wash your hands often with soap and water for at least 20 seconds or clean your hands with an alcohol-based hand sanitizer that contains at least 60% alcohol. 8. As much as possible, stay in a specific room and away from other people in your home. Also, you should use a separate bathroom, if available. If you need to be around other people in or outside of the home, wear a mask. 9. Avoid sharing personal items with other people in your household, like dishes, towels, and bedding. 10. Clean all surfaces that are touched often, like counters, tabletops, and doorknobs. Use household cleaning sprays or wipes according to the label instructions. cdc.gov/coronavirus 10/18/2018 This information is not intended to replace advice given to you by your health care provider. Make sure you discuss any questions you have with your health care provider. Document Revised: 03/22/2019 Document Reviewed: 03/22/2019 Elsevier Patient Education  2020 Elsevier Inc.  

## 2019-12-30 ENCOUNTER — Ambulatory Visit (HOSPITAL_COMMUNITY)
Admit: 2019-12-30 | Discharge: 2019-12-30 | Disposition: A | Discharge status: Home / Self Care | Payer: Medicare Other | Source: Ambulatory Visit | Attending: Pulmonary Disease | Admitting: Pulmonary Disease

## 2019-12-30 DIAGNOSIS — U071 COVID-19: Secondary | ICD-10-CM | POA: Diagnosis present

## 2019-12-30 DIAGNOSIS — J1282 Pneumonia due to coronavirus disease 2019: Secondary | ICD-10-CM | POA: Insufficient documentation

## 2019-12-30 MED ORDER — ALBUTEROL SULFATE HFA 108 (90 BASE) MCG/ACT IN AERS: 2.0000 | INHALATION_SPRAY | Freq: Once | RESPIRATORY_TRACT | Status: DC | PRN

## 2019-12-30 MED ORDER — DIPHENHYDRAMINE HCL 50 MG/ML IJ SOLN: 50.0000 mg | Freq: Once | INTRAMUSCULAR | Status: DC | PRN

## 2019-12-30 MED ORDER — SODIUM CHLORIDE 0.9 % IV SOLN
100.0000 mg | Freq: Once | INTRAVENOUS | Status: AC
  Administered 2019-12-30: 100 mg via INTRAVENOUS
  Filled 2019-12-30: qty 20

## 2019-12-30 MED ORDER — METHYLPREDNISOLONE SODIUM SUCC 125 MG IJ SOLR: 125.0000 mg | Freq: Once | INTRAMUSCULAR | Status: DC | PRN

## 2019-12-30 MED ORDER — SODIUM CHLORIDE 0.9 % IV SOLN: INTRAVENOUS | Status: DC | PRN

## 2019-12-30 MED ORDER — FAMOTIDINE IN NACL 20-0.9 MG/50ML-% IV SOLN: 20.0000 mg | Freq: Once | INTRAVENOUS | Status: DC | PRN

## 2019-12-30 MED ORDER — EPINEPHRINE 0.3 MG/0.3ML IJ SOAJ: 0.3000 mg | Freq: Once | INTRAMUSCULAR | Status: DC | PRN

## 2019-12-30 NOTE — Progress Notes (Signed)
  Diagnosis: COVID-19  Physician: Dr. Joya Gaskins  Procedure: [facility]fusion [facility] Med: remdesivir infusion - Provided patient with remdesivir fact sheet for patients, parents and caregivers prior to infusion.  Complications: No immediate complications noted.  Discharge: Discharged home   [place] 12/30/2019

## 2019-12-31 ENCOUNTER — Ambulatory Visit: Payer: Medicare Other | Admitting: Neurology

## 2019-12-31 LAB — CULTURE, BLOOD (ROUTINE X 2)
Culture: NO GROWTH
Culture: NO GROWTH
Special Requests: ADEQUATE
Special Requests: ADEQUATE

## 2019-12-31 LAB — CULTURE, BLOOD (SINGLE)
Culture: NO GROWTH
Special Requests: ADEQUATE

## 2020-01-04 ENCOUNTER — Ambulatory Visit (INDEPENDENT_AMBULATORY_CARE_PROVIDER_SITE_OTHER): Payer: Medicare Other | Admitting: Licensed Clinical Social Worker

## 2020-01-04 ENCOUNTER — Telehealth: Payer: Self-pay | Admitting: Family Medicine

## 2020-01-04 ENCOUNTER — Telehealth: Payer: Self-pay | Admitting: *Deleted

## 2020-01-04 DIAGNOSIS — Z85841 Personal history of malignant neoplasm of brain: Secondary | ICD-10-CM

## 2020-01-04 DIAGNOSIS — M810 Age-related osteoporosis without current pathological fracture: Secondary | ICD-10-CM | POA: Diagnosis not present

## 2020-01-04 DIAGNOSIS — E785 Hyperlipidemia, unspecified: Secondary | ICD-10-CM

## 2020-01-04 DIAGNOSIS — Z85118 Personal history of other malignant neoplasm of bronchus and lung: Secondary | ICD-10-CM

## 2020-01-04 DIAGNOSIS — N183 Chronic kidney disease, stage 3 unspecified: Secondary | ICD-10-CM | POA: Diagnosis not present

## 2020-01-04 MED ORDER — ALBUTEROL SULFATE HFA 108 (90 BASE) MCG/ACT IN AERS: 2.0000 | INHALATION_SPRAY | Freq: Four times a day (QID) | RESPIRATORY_TRACT | 2 refills | Status: DC | PRN

## 2020-01-04 NOTE — Chronic Care Management (AMB) (Signed)
Chronic Care Management    Clinical Social Work Follow Up Note  01/04/2020 Name: RAIZA KIESEL MRN: [id number] DOB: [birth year]  NOELY KUHNLE is a 69 y.o. year old female who is a primary care patient of Loman Brooklyn, FNP. The CCM team was consulted for assistance with [employer] .   Review of patient status, including review of consultants reports, other relevant assessments, and collaboration with appropriate care team members and the patient's provider was performed as part of comprehensive patient evaluation and provision of chronic care management services.    SDOH (Social Determinants of Health) assessments performed: No;risk for tobacco use; risk for depression; risk for stress; risk for financial strain    Office Visit from 10/05/2019 in [place]  PHQ-9 Total Score 0       Outpatient Encounter Medications as of 01/04/2020  Medication Sig  . ascorbic acid (VITAMIN C) 500 MG tablet Take 1 tablet (500 mg total) by mouth daily.  Marland Kitchen donepezil (ARICEPT) 10 MG tablet Take 1 tablet (10 mg total) by mouth at bedtime.  . feeding supplement, ENSURE ENLIVE, (ENSURE ENLIVE) LIQD Take 237 mLs by mouth 2 (two) times daily between meals.  . folic acid (FOLVITE) 1 MG tablet Take 1 tablet (1 mg total) by mouth daily.  . magnesium oxide (MAG-OX) 400 MG tablet Take 400 mg by mouth daily.  . Multiple Vitamin (MULTIVITAMIN) tablet Take 1 tablet by mouth daily.  Marland Kitchen OVER THE COUNTER MEDICATION Take 1 tablet by mouth at bedtime. Sleep 3  . predniSONE (DELTASONE) 20 MG tablet Take 2 tablets (40 mg total) by mouth 2 (two) times daily with a meal.  . rosuvastatin (CRESTOR) 10 MG tablet Take 1 tablet (10 mg total) by mouth at bedtime.  . vitamin B-12 (CYANOCOBALAMIN) 500 MCG tablet Take 1 tablet (500 mcg total) by mouth daily.  Marland Kitchen zinc sulfate 220 (50 Zn) MG capsule Take 1 capsule (220 mg total) by mouth daily.   No facility-administered encounter medications on file as of  01/04/2020.    Goals    .  Client said she wants help in improving her living situation and wants to look for other housing options in area. (pt-stated)      Current Barriers:  . Financial constraints of client with chronic diagnoses of CKD, Osteoporosis, Hx Cancer of Lung, Hx of Cancer of the brain, Hyperlipidemia, and Memory impairment . Limited social support  Clinical Social Work Clinical Goal(s):  Marland Kitchen Over next 30 days, client will work with LCSW to address concerns related to housing issues of client and discuss housing options for client in the area.   Interventions:  Talked with Heron Sabins, son of client, about current needs of client  Talked with [name] about sleeping issues of client  Talked with [name] about upcoming client appointments  Talked with [name] about ambulation needs of client(uses a cane or walker to ambulate)  Talked with [name] about pain issues of client  Encouraged Rodney/client to call RNCM to discuss client's nursing needs  Talked with [name] about appetite of client.  Talked with [name] about client diagnoses of COVID -88  Talked with [name] about client in home care  Talked with [name] about home health physical therapy received by client  Talked with [name] about home health nursing support of client  Collaborated with [facility] Triage nurse regarding nursing needs of client  Patient Self Care Activities:  . Attends all scheduled provider appointments  . Performs ADL's independently . Contacts  [name], caregiver to discuss client needs  Plan:  . Patient will contact LCSW to discuss psychosical needs of client  . LCSW will call client in next 4 weeks to assess client housing needs . Patient will communicate with RN CM to discuss nursing needs of client . Client to call 911 for emergency assistance as needed.  . Client to attend scheduled client medical appointments  Initial goal documentation   Follow Up Plan: LCSW will call client  in next 4 weeks to assess client housing needs at that time  Norva Riffle.Nakaiya Beddow MSW, LCSW Licensed Clinical Social Worker [facility] [phone]

## 2020-01-04 NOTE — Patient Instructions (Addendum)
Licensed Clinical Social Worker Visit Information  Goals we discussed today:  .  Client said she wants help in improving her living situation and wants to look for other housing options in area. (pt-stated)        Current Barriers:   Financial constraints of client with chronic diagnoses of CKD, Osteoporosis, Hx Cancer of Lung, Hx of Cancer of the brain, Hyperlipidemia, and Memory impairment  Limited social support  Clinical Social Work Clinical Goal(s):   Over next 30 days, client will work with LCSW to address concerns related to housing issues of client and discuss housing options for client in the area.   Interventions:  Talked with Heron Sabins, son of client, about current needs of client  Talked with [name] about sleeping issues of client  Talked withRodneyaboutupcoming client appointments  Talked withRodneyabout ambulation needs of client(uses a cane or walker to ambulate)  Talked withRodney about pain issues of client  EncouragedRodney/client to call RNCM to discuss client's nursing needs  Talked with [name] about appetite of client.  Talked with [name] about client diagnoses of COVID -18  Talked with [name] about client in home care  Talked with [name] about home health physical therapy received by client  Talked with [name] about home health nursing support of client  Collaborated with [facility] Triage nurse regarding nursing needs of client  Patient Self Care Activities:   Attends all scheduled provider appointments   Performs ADL's independently  Contacts Jamie [name], caregiver to discuss client needs  Plan:   Patient will contact LCSW to discuss psychosical needs of client   LCSW will call client in next 4 weeks to assess client housing needs  Patient will communicate with RN CM to discuss nursing needs of client  Client to call 911 for emergency assistance as needed.   Client to attend scheduled client medical  appointments  Initial goal documentation   Follow Up Plan: LCSW will call client in next 4 weeks to assess client housing needs at that time  Materials Provided: No  The patient /Jamie [name], son of client,verbalized understanding of instructions provided today and declined a print copy of patient instruction materials.   Norva Riffle.Jamie [name] MSW, LCSW Licensed Clinical Social Worker [facility] [phone]

## 2020-01-04 NOTE — Telephone Encounter (Signed)
TC w/ [facility] w/ [facility] Pt c/o SOB, uncomfortable, O2 sats only getting down to about 91% Wanting to know about getting an inhaler for patient Send to [place] if appropriate

## 2020-01-04 NOTE — Telephone Encounter (Signed)
Inhaler sent. I have spoken to patient's son and discussed symptoms which should prompt her to get to the ER ASAP. He reports she is doing much better, they just wanted the inhaler if they needed it due to the diagnosis.

## 2020-01-04 NOTE — Addendum Note (Signed)
Addended by: Loman Brooklyn on: 01/04/2020 05:42 PM   Modules accepted: Orders

## 2020-01-04 NOTE — Telephone Encounter (Signed)
Son states that patient has recently been diagnosed with COVID-19 and son would like for patient to be vaccinated.  Time frame given.

## 2020-01-08 ENCOUNTER — Other Ambulatory Visit: Payer: Self-pay

## 2020-01-08 ENCOUNTER — Ambulatory Visit (INDEPENDENT_AMBULATORY_CARE_PROVIDER_SITE_OTHER): Payer: Medicare Other

## 2020-01-08 DIAGNOSIS — D72819 Decreased white blood cell count, unspecified: Secondary | ICD-10-CM

## 2020-01-08 DIAGNOSIS — G4700 Insomnia, unspecified: Secondary | ICD-10-CM

## 2020-01-08 DIAGNOSIS — F0390 Unspecified dementia without behavioral disturbance: Secondary | ICD-10-CM

## 2020-01-08 DIAGNOSIS — J9601 Acute respiratory failure with hypoxia: Secondary | ICD-10-CM

## 2020-01-08 DIAGNOSIS — G9341 Metabolic encephalopathy: Secondary | ICD-10-CM

## 2020-01-08 DIAGNOSIS — E11319 Type 2 diabetes mellitus with unspecified diabetic retinopathy without macular edema: Secondary | ICD-10-CM

## 2020-01-08 DIAGNOSIS — N1831 Chronic kidney disease, stage 3a: Secondary | ICD-10-CM

## 2020-01-08 DIAGNOSIS — E1122 Type 2 diabetes mellitus with diabetic chronic kidney disease: Secondary | ICD-10-CM

## 2020-01-08 DIAGNOSIS — E785 Hyperlipidemia, unspecified: Secondary | ICD-10-CM

## 2020-01-08 DIAGNOSIS — E7800 Pure hypercholesterolemia, unspecified: Secondary | ICD-10-CM

## 2020-01-08 DIAGNOSIS — C3490 Malignant neoplasm of unspecified part of unspecified bronchus or lung: Secondary | ICD-10-CM

## 2020-01-08 DIAGNOSIS — C7931 Secondary malignant neoplasm of brain: Secondary | ICD-10-CM

## 2020-01-08 DIAGNOSIS — M810 Age-related osteoporosis without current pathological fracture: Secondary | ICD-10-CM

## 2020-01-08 DIAGNOSIS — J1289 Other viral pneumonia: Secondary | ICD-10-CM | POA: Diagnosis not present

## 2020-01-11 ENCOUNTER — Telehealth: Payer: Medicare Other

## 2020-01-30 ENCOUNTER — Other Ambulatory Visit: Payer: Self-pay | Admitting: Family Medicine

## 2020-01-30 DIAGNOSIS — R413 Other amnesia: Secondary | ICD-10-CM

## 2020-02-12 ENCOUNTER — Ambulatory Visit: Payer: Medicare Other | Admitting: Licensed Clinical Social Worker

## 2020-02-12 DIAGNOSIS — N183 Chronic kidney disease, stage 3 unspecified: Secondary | ICD-10-CM

## 2020-02-12 DIAGNOSIS — M810 Age-related osteoporosis without current pathological fracture: Secondary | ICD-10-CM

## 2020-02-12 DIAGNOSIS — E785 Hyperlipidemia, unspecified: Secondary | ICD-10-CM

## 2020-02-12 DIAGNOSIS — R413 Other amnesia: Secondary | ICD-10-CM

## 2020-02-12 DIAGNOSIS — Z85841 Personal history of malignant neoplasm of brain: Secondary | ICD-10-CM

## 2020-02-12 DIAGNOSIS — Z85118 Personal history of other malignant neoplasm of bronchus and lung: Secondary | ICD-10-CM

## 2020-02-12 NOTE — Chronic Care Management (AMB) (Signed)
Chronic Care Management    Clinical Social Work Follow Up Note  02/12/2020 Name: [place] Hanlon MRN: [id number] DOB: 01-11-1951  Dailyn Reith Sachdeva is a 69 y.o. year old female who is a primary care patient of Janora Norlander, DO. The CCM team was consulted for assistance with [employer] .   Review of patient status, including review of consultants reports, other relevant assessments, and collaboration with appropriate care team members and the patient's provider was performed as part of comprehensive patient evaluation and provision of chronic care management services.    SDOH (Social Determinants of Health) assessments performed: No;risk for depression; risk for tobacco use; risk for stress; risk for physical inactivity; risk for financial strain    Office Visit from 10/05/2019 in [place]  PHQ-9 Total Score 0       Outpatient Encounter Medications as of 02/12/2020  Medication Sig  . albuterol (VENTOLIN HFA) 108 (90 Base) MCG/ACT inhaler Inhale 2 puffs into the lungs every 6 (six) hours as needed for wheezing or shortness of breath.  Marland Kitchen ascorbic acid (VITAMIN C) 500 MG tablet Take 1 tablet (500 mg total) by mouth daily.  Marland Kitchen donepezil (ARICEPT) 10 MG tablet TAKE ONE TABLET AT BEDTIME  . feeding supplement, ENSURE ENLIVE, (ENSURE ENLIVE) LIQD Take 237 mLs by mouth 2 (two) times daily between meals.  . folic acid (FOLVITE) 1 MG tablet Take 1 tablet (1 mg total) by mouth daily.  . magnesium oxide (MAG-OX) 400 MG tablet Take 400 mg by mouth daily.  . Multiple Vitamin (MULTIVITAMIN) tablet Take 1 tablet by mouth daily.  Marland Kitchen OVER THE COUNTER MEDICATION Take 1 tablet by mouth at bedtime. Sleep 3  . predniSONE (DELTASONE) 20 MG tablet Take 2 tablets (40 mg total) by mouth 2 (two) times daily with a meal.  . rosuvastatin (CRESTOR) 10 MG tablet Take 1 tablet (10 mg total) by mouth at bedtime.  . vitamin B-12 (CYANOCOBALAMIN) 500 MCG tablet Take 1 tablet (500 mcg  total) by mouth daily.  Marland Kitchen zinc sulfate 220 (50 Zn) MG capsule Take 1 capsule (220 mg total) by mouth daily.   No facility-administered encounter medications on file as of 02/12/2020.    Goals    .  Client said she wants help in improving her living situation and wants to look for other housing options in area. (pt-stated)      Current Barriers:  . Financial constraints of client with chronic diagnoses of CKD, Osteoporosis, Hx Cancer of Lung, Hx of Cancer of the brain, Hyperlipidemia, and Memory impairment . Limited social support  Clinical Social Work Clinical Goal(s):  Marland Kitchen Over next 30 days, client will work with LCSW to address concerns related to housing issues of client and discuss housing options for client in the area.   Interventions:  Talked with Heron Sabins, son of client, about current needs of client  Talked with [name] about sleeping issues of client  Talked withRodneyaboutupcoming client appointments  Talked withRodneyabout ambulation needs of client(uses a cane or walker to ambulate)  Talked withRodney about pain issues of client  EncouragedRodney/client to call RNCM to discuss client's nursing needs  Talked with [name] about appetite of client.  Talked with [name] about client in home care support  Talked with [name] about home health physical therapy received by client  Talked with [name] about home health support of client  Talked with [name] about client recovery from COVID-19 [name] said he needs to take client to be tested again  for COVID to ensure she is negative for COVID 35)  Talked with [name] about current housing situation for client  Patient Self Care Activities:  . Attends all scheduled provider appointments  . Performs ADL's independently  Plan:  . Patient will contact LCSW to discuss psychosical needs of client  . LCSW will call client in four weeks to assess client housing needs . Patient will communicate with RN CM to discuss  nursing needs of client . Client to call 911 for emergency assistance as needed.  . Client to attend scheduled client medical appointments  Initial goal documentation   Follow Up Plan: LCSW will call client /son in next 4 weeks to assess client housing needs at that time  Norva Riffle.Lot Medford MSW, LCSW Licensed Clinical Social Worker [facility] [phone]

## 2020-02-12 NOTE — Patient Instructions (Addendum)
Licensed Clinical Social Worker Visit Information  Goals we discussed today:   .  Client said she wants help in improving her living situation and wants to look for other housing options in area. (pt-stated)       Current Barriers:   Financial constraints of client with chronic diagnoses of CKD, Osteoporosis, Hx Cancer of Lung, Hx of Cancer of the brain, Hyperlipidemia, and Memory impairment  Limited social support  Clinical Social Work Clinical Goal(s):   Over next 30 days, client will work with LCSW to address concerns related to housing issues of client and discuss housing options for client in the area.   Interventions:  Talked with Heron Sabins, son of client, about current needs of client  Talked with [name] about sleeping issues of client  Talked withRodneyaboutupcoming client appointments  Talked withRodneyabout ambulation needs of client(uses a cane or walker to ambulate)  Talked withRodney about pain issues of client  EncouragedRodney/client to call RNCM to discuss client's nursing needs  Talked with [name] about appetite of client.  Talked with [name] about client in home care support  Talked with [name] about home health physical therapy received by client  Talked with [name] about home health support of client  Talked with [name] about client recovery from COVID-19 [name] said he needs to take client to be tested again for COVID to ensure she is negative for COVID 79)  Talked with [name] about current housing situation for client  Patient Self Care Activities:   Attends all scheduled provider appointments   Performs ADL's independently  Plan:   Patient will contact LCSW to discuss psychosical needs of client   LCSW will call client in four weeks to assess client housing needs  Patient will communicate with RN CM to discuss nursing needs of client  Client to call 911 for emergency assistance as needed.   Client to attend  scheduled client medical appointments  Initial goal documentation   Follow Up Plan:LCSW will call client /son in next 4 weeks to assess client housing needs at that time  Materials Provided: No  The patient/Rodney Karapetian, son of client, verbalized understanding of instructions provided today and declined a print copy of patient instruction materials.   Norva Riffle.Osama Coleson MSW, LCSW Licensed Clinical Social Worker [facility] [phone]

## 2020-02-25 ENCOUNTER — Encounter: Payer: Self-pay | Admitting: Internal Medicine

## 2020-03-12 ENCOUNTER — Telehealth: Payer: Medicare Other

## 2020-04-03 ENCOUNTER — Other Ambulatory Visit: Payer: Self-pay

## 2020-04-03 ENCOUNTER — Ambulatory Visit: Payer: Medicare Other | Admitting: *Deleted

## 2020-04-03 VITALS — Ht 67.0 in | Wt 176.0 lb

## 2020-04-03 DIAGNOSIS — Z1211 Encounter for screening for malignant neoplasm of colon: Secondary | ICD-10-CM

## 2020-04-03 DIAGNOSIS — Z01818 Encounter for other preprocedural examination: Secondary | ICD-10-CM

## 2020-04-03 NOTE — Progress Notes (Signed)
Patient/Home Aide Jamie [name] denies any allergies to egg or soy products. Patient denies complications with anesthesia/sedation.  Patient denies oxygen use at home and denies diet medications. Emmi instructions for colonoscopy explained but patient denied information.  Covid test scheduled for 04/27/20 at 10 am.

## 2020-04-21 ENCOUNTER — Telehealth: Payer: Medicare Other

## 2020-04-29 ENCOUNTER — Other Ambulatory Visit: Payer: Self-pay | Admitting: Family Medicine

## 2020-04-29 DIAGNOSIS — R413 Other amnesia: Secondary | ICD-10-CM

## 2020-05-01 ENCOUNTER — Telehealth: Payer: Self-pay | Admitting: Internal Medicine

## 2020-05-01 ENCOUNTER — Encounter: Payer: Medicare Other | Admitting: Internal Medicine

## 2020-05-01 NOTE — Telephone Encounter (Signed)
OK no charge ?

## 2020-05-01 NOTE — Telephone Encounter (Signed)
Dest[facility] care giver called states the patient did not go for her covid test-they rescheduled for 06/05/20.

## 2020-05-22 ENCOUNTER — Telehealth: Payer: Medicare Other

## 2020-05-26 ENCOUNTER — Ambulatory Visit: Payer: Medicare Other

## 2020-06-04 ENCOUNTER — Telehealth: Payer: Self-pay | Admitting: Family Medicine

## 2020-06-05 ENCOUNTER — Encounter: Payer: Medicare Other | Admitting: Internal Medicine

## 2020-06-10 ENCOUNTER — Ambulatory Visit (INDEPENDENT_AMBULATORY_CARE_PROVIDER_SITE_OTHER): Payer: Medicare Other | Admitting: Family Medicine

## 2020-06-10 ENCOUNTER — Other Ambulatory Visit: Payer: Self-pay

## 2020-06-10 ENCOUNTER — Encounter: Payer: Self-pay | Admitting: Family Medicine

## 2020-06-10 VITALS — BP 144/85 | HR 84 | Temp 97.3°F | Ht 67.0 in | Wt 172.0 lb

## 2020-06-10 DIAGNOSIS — R443 Hallucinations, unspecified: Secondary | ICD-10-CM | POA: Diagnosis not present

## 2020-06-10 DIAGNOSIS — F0390 Unspecified dementia without behavioral disturbance: Secondary | ICD-10-CM

## 2020-06-10 DIAGNOSIS — Z87898 Personal history of other specified conditions: Secondary | ICD-10-CM

## 2020-06-10 DIAGNOSIS — F03A0 Unspecified dementia, mild, without behavioral disturbance, psychotic disturbance, mood disturbance, and anxiety: Secondary | ICD-10-CM

## 2020-06-10 DIAGNOSIS — R296 Repeated falls: Secondary | ICD-10-CM

## 2020-06-10 DIAGNOSIS — Z85118 Personal history of other malignant neoplasm of bronchus and lung: Secondary | ICD-10-CM

## 2020-06-10 NOTE — Patient Instructions (Signed)
Will see if I can get Home health.  Recommend checking with department of social services for [facility] eligibility. This would allow her to get personal care services for a few hours in the home.  Strongly recommend considering placement if she is starting to be a danger to self (particularly with leaving doors open and if she is having wondering behaviors)  New referral to neurology placed.

## 2020-06-10 NOTE — Progress Notes (Signed)
Subjective: CC: dementia PCP: Janora Norlander, DO [facility] Jamie [name] is a 70 y.o. female presenting to clinic today for:  1.  Dementia Patient with known mild dementia.  She is on Aricept.  She never did see the neurologist.  Her son is healthcare power of attorney but did not receive a telephone call.  She has brought today by her granddaughter, who is worried that she may need 24/7 care.  She is asking to see if we can get somebody to come into the home to sit with her as patient does reside alone and her children are only able to help her on weekends due to their work schedules and obligations.  She continues to have frequent falls.  These can go unwitnessed if there is nobody with her.  She always needs help to get up from the ground as she is unable to do this by herself.  She does use her rolling walker in the home.  Her granddaughter reports visual and auditory hallucinations where she will see figures in the window even though the window shades are pulled down.  She does not access the stove and in fact her daughter has unplugged the stove so that she does not burn anything.  She does utilize the microwave for meal preparation.  She is incontinent of urine.  She has pads and depends at home.  She uses melatonin at bedtime for sleep.   ROS: Per HPI  Allergies  Allergen Reactions  . Fosamax [Alendronate Sodium] Other (See Comments)    "unknown"  . Sulfonamide Derivatives     REACTION: Hives, tongue swells   Past Medical History:  Diagnosis Date  . Allergy   . Ambulates with cane    4 prong straight cane  . Ataxia    uses cane as ambulation  . Brain cancer ([place])   . Cancer ([place])   . Cataract    removed by surgery  . CKD (chronic kidney disease), stage III ([place])   . Dementia ([place])   . Diabetic retinopathy ([place])    NPDR OU - patient denies this dx  . High cholesterol   . Hypertensive retinopathy    OU  . Insomnia   . Lung cancer ([place])   . Osteoporosis   . Walker as  ambulation aid     Current Outpatient Medications:  .  donepezil (ARICEPT) 10 MG tablet, Take 1 tablet (10 mg total) by mouth at bedtime. (Needs to be seen before next refill), Disp: 30 tablet, Rfl: 0 .  Multiple Vitamin (MULTIVITAMIN) tablet, Take 1 tablet by mouth daily., Disp: , Rfl:  .  OVER THE COUNTER MEDICATION, Take 1 tablet by mouth at bedtime. Sleep 3 - melatonin, Disp: , Rfl:  Social History   Socioeconomic History  . Marital status: Divorced    Spouse name: Not on file  . Number of children: 2  . Years of education: 9  . Highest education level: 9th grade  Occupational History  . Occupation: retired  Tobacco Use  . Smoking status: Former Smoker    Years: 33.00    Types: Cigarettes    Quit date: 04/20/1999    Years since quitting: 21.1  . Smokeless tobacco: Never Used  Vaping Use  . Vaping Use: Never used  Substance and Sexual Activity  . Alcohol use: Not Currently  . Drug use: No  . Sexual activity: Not Currently  Other Topics Concern  . Not on file  Social History Narrative  . Not  on file   Social Determinants of Health   Financial Resource Strain: Not on file  Food Insecurity: Not on file  Transportation Needs: Not on file  Physical Activity: Not on file  Stress: Not on file  Social Connections: Not on file  Intimate Partner Violence: Not on file   Family History  Problem Relation Age of Onset  . Cancer Mother        cirrhois of liver  . Heart attack Father   . Colon cancer Father   . Asthma Brother   . Rectal cancer Neg Hx   . Stomach cancer Neg Hx     Objective: Office vital signs reviewed. BP (!) 144/85   Pulse 84   Temp (!) 97.3 F (36.3 C) (Temporal)   Ht 5\' 7"  (1.702 m)   Wt 172 lb (78 kg)   LMP  (LMP Unknown)   SpO2 95%   BMI 26.94 kg/m   Physical Examination:  General: Awake, alert, No acute distress HEENT: sclera white Cardio: regular rate and rhythm, S1S2 heard, no murmurs appreciated Pulm: clear to auscultation  bilaterally, no wheezes, rhonchi or rales; normal work of breathing on room air MSK: Requires rolling walker and assistance for ambulation.  Tone is fair Neuro: Very hard of hearing. Psych: Seems to space out mentally.  Does not appear to be responding to internal stimuli  MMSE - Mini Mental State Exam 06/10/2020 10/05/2019 12/20/2018  Not completed: - - Unable to complete  Orientation to time 0 4 -  Orientation to Place 4 4 -  Registration 3 3 -  Attention/ Calculation 2 3 -  Attention/Calculation-comments - - -  Recall 3 0 -  Language- name 2 objects 2 2 -  Language- repeat 1 1 -  Language- follow 3 step command 2 2 -  Language- read & follow direction 1 1 -  Write a sentence 0 1 -  Write a sentence-comments - - -  Copy design 0 1 -  Total score 18 22 -     Assessment/ Plan: 70 y.o. female   Mild dementia ([place]) - Plan: Ambulatory referral to Neurology, Ambulatory referral to [place], AMB Referral to [place] [facility]  Hallucination - Plan: Ambulatory referral to Neurology, Ambulatory referral to [place], AMB Referral to [place] [facility]  History of brain tumor - Plan: Ambulatory referral to Neurology, Ambulatory referral to [place], AMB Referral to [place] frequently - Plan: Ambulatory referral to [place], AMB Referral to [place] [facility]  Hx of cancer of lung  Treated with Aricept.  Never did see the neurologist.  I have replaced the referral and connected the patient's daughter's telephone number for appointment making  I have also ordered home health in efforts to assist patient, who resides alone.  I have highly encouraged the patient to consider applying for Medicaid if she plans on continuing to reside in the home for personal care services.  May need to strongly consider placement however given reports of leaving the door open.  I worry about wandering behaviors and potential harm to self.  Difficult to tell what her true  limitations are since she typically resides alone and has help only on weekends.  Given her frequent fall I do worry about her injuring herself and she is unable to get up unassisted.  Again may need to consider placement strongly   Orders Placed This Encounter  Procedures  . Ambulatory referral to Neurology    Referral  Priority:   Routine    Referral Type:   Consultation    Referral Reason:   Specialty Services Required    Requested Specialty:   Neurology    Number of Visits Requested:   1  . Ambulatory referral to Home Health    Referral Priority:   Routine    Referral Type:   Home Health Care    Referral Reason:   Specialty Services Required    Requested Specialty:   [place]    Number of Visits Requested:   1  . AMB Referral to [place] [facility]    Referral Priority:   Routine    Referral Type:   Consultation    Referral Reason:   [facility]    Number of Visits Requested:   1   No orders of the defined types were placed in this encounter.    Janora Norlander, DO [place] [phone]

## 2020-06-19 ENCOUNTER — Telehealth: Payer: Medicare Other | Admitting: *Deleted

## 2020-06-20 ENCOUNTER — Telehealth: Payer: Self-pay | Admitting: *Deleted

## 2020-06-20 NOTE — Telephone Encounter (Signed)
  Chronic Care Management   Outreach Note  06/20/2020 Name: Jamie [name] MRN: [id number] DOB: 13-Dec-1950  Referred by: Janora Norlander, DO Reason for referral : No chief complaint on file.   A first unsuccessful follo-wup Telephone Visit was attempted today. The patient was referred to the case management team for assistance with care management and care coordination.   Clinical Goals: . Over the next 30 days, patient will be contacted by a Care Guide to reschedule their CCM Visit  Interventions and Plan . Chart reviewed in preparation for telephone visit . Collaboration with other care team members as needed . A HIPAA compliant phone message was left for the patient providing contact information and requesting a return call.  . Telephone visit scheduled with LCSW for 06/24/20 . Rescheduled telephone visit with RNCM for 06/27/20   [name], BSN, RN-BC [place] [facility] [place] [facility] Direct Dial: [phone]

## 2020-06-23 ENCOUNTER — Ambulatory Visit (INDEPENDENT_AMBULATORY_CARE_PROVIDER_SITE_OTHER): Payer: Medicare Other

## 2020-06-23 ENCOUNTER — Other Ambulatory Visit: Payer: Self-pay

## 2020-06-23 DIAGNOSIS — E1122 Type 2 diabetes mellitus with diabetic chronic kidney disease: Secondary | ICD-10-CM | POA: Diagnosis not present

## 2020-06-23 DIAGNOSIS — Z85841 Personal history of malignant neoplasm of brain: Secondary | ICD-10-CM

## 2020-06-23 DIAGNOSIS — H35033 Hypertensive retinopathy, bilateral: Secondary | ICD-10-CM | POA: Diagnosis not present

## 2020-06-23 DIAGNOSIS — I129 Hypertensive chronic kidney disease with stage 1 through stage 4 chronic kidney disease, or unspecified chronic kidney disease: Secondary | ICD-10-CM | POA: Diagnosis not present

## 2020-06-23 DIAGNOSIS — N183 Chronic kidney disease, stage 3 unspecified: Secondary | ICD-10-CM

## 2020-06-23 DIAGNOSIS — F0390 Unspecified dementia without behavioral disturbance: Secondary | ICD-10-CM

## 2020-06-23 DIAGNOSIS — E11319 Type 2 diabetes mellitus with unspecified diabetic retinopathy without macular edema: Secondary | ICD-10-CM

## 2020-06-23 DIAGNOSIS — Z9181 History of falling: Secondary | ICD-10-CM

## 2020-06-23 DIAGNOSIS — G4700 Insomnia, unspecified: Secondary | ICD-10-CM

## 2020-06-23 DIAGNOSIS — R32 Unspecified urinary incontinence: Secondary | ICD-10-CM

## 2020-06-23 DIAGNOSIS — Z85118 Personal history of other malignant neoplasm of bronchus and lung: Secondary | ICD-10-CM

## 2020-06-23 DIAGNOSIS — M810 Age-related osteoporosis without current pathological fracture: Secondary | ICD-10-CM

## 2020-06-23 DIAGNOSIS — E7800 Pure hypercholesterolemia, unspecified: Secondary | ICD-10-CM

## 2020-06-23 DIAGNOSIS — Z87891 Personal history of nicotine dependence: Secondary | ICD-10-CM

## 2020-06-24 ENCOUNTER — Ambulatory Visit: Payer: Medicare Other | Admitting: Licensed Clinical Social Worker

## 2020-06-24 DIAGNOSIS — N183 Chronic kidney disease, stage 3 unspecified: Secondary | ICD-10-CM

## 2020-06-24 DIAGNOSIS — E785 Hyperlipidemia, unspecified: Secondary | ICD-10-CM

## 2020-06-24 DIAGNOSIS — M810 Age-related osteoporosis without current pathological fracture: Secondary | ICD-10-CM

## 2020-06-24 DIAGNOSIS — Z85118 Personal history of other malignant neoplasm of bronchus and lung: Secondary | ICD-10-CM

## 2020-06-24 DIAGNOSIS — Z85841 Personal history of malignant neoplasm of brain: Secondary | ICD-10-CM

## 2020-06-24 NOTE — Patient Instructions (Addendum)
Licensed Clinical Social Worker Visit Information  Materials Provided: No  06/24/2020  Name: Jamie [name]          MRN: [id number]       DOB: 10-27-1950  Jamie [name] is a 70 y.o. year old female who is a primary care patient of Jamie Norlander, DO. The CCM team was consulted for assistance with Jamie [name] .   Review of patient status, including review of consultants reports, other relevant assessments, and collaboration with appropriate care team members and the patient's provider was performed as part of comprehensive patient evaluation and provision of chronic care management services.    SDOH (Social Determinants of Health) assessments performed: No; risk for depression; risk for tobacco use; risk for stress; risk for physical inactivity  LCSW called phone number today of Jamie [name], son of client; but, LCSW was not able to speak via phone today with Jamie [name].  LCSW did leave phone message for Jamie [name] asking him to please call LCSW at [phone]  Follow Up Plan:LCSW will call client/sonin next 4 weeks to assess client housing needs at that time  LCSW was not able to speak with client or son Jamie [name], today via phone; thus, the client or her son were not able to verbalize understanding of instructions provided today and were not able to accept or decline a print copy of patient instruction materials.   Jamie [name].Jamie [name] MSW, LCSW Licensed Clinical Social Worker [facility] Care Management [phone]

## 2020-06-24 NOTE — Chronic Care Management (AMB) (Signed)
  Chronic Care Management    Clinical Social Work Follow Up Note  06/24/2020 Name: Jamie [name] MRN: [id number] DOB: [birth year]  [name] is a 69 y.o. year old female who is a primary care patient of Janora Norlander, DO. The CCM team was consulted for assistance with [employer] .   Review of patient status, including review of consultants reports, other relevant assessments, and collaboration with appropriate care team members and the patient's provider was performed as part of comprehensive patient evaluation and provision of chronic care management services.    SDOH (Social Determinants of Health) assessments performed: No; risk for depression; risk for tobacco use; risk for stress; risk for physical inactivity  [place] Office Visit from 10/05/2019 in [facility]  PHQ-9 Total Score 0      Outpatient Encounter Medications as of 06/24/2020  Medication Sig  . donepezil (ARICEPT) 10 MG tablet Take 1 tablet (10 mg total) by mouth at bedtime. (Needs to be seen before next refill)  . Multiple Vitamin (MULTIVITAMIN) tablet Take 1 tablet by mouth daily.  Marland Kitchen OVER THE COUNTER MEDICATION Take 1 tablet by mouth at bedtime. Sleep 3 - melatonin   No facility-administered encounter medications on file as of 06/24/2020.    LCSW called phone number today of Arbie Reisz, son of client; but, LCSW was not able to speak via phone today with Heron Sabins.  LCSW did leave phone message for [name] asking him to please call LCSW at [phone]  Follow Up Plan:LCSW will call client /son in next 4 weeks to assess client housing needs at that time  Norva Riffle.Forrest MSW, LCSW Licensed Clinical Social Worker [facility] Care Management [phone]

## 2020-06-25 ENCOUNTER — Ambulatory Visit (INDEPENDENT_AMBULATORY_CARE_PROVIDER_SITE_OTHER): Payer: Medicare Other | Admitting: Licensed Clinical Social Worker

## 2020-06-25 DIAGNOSIS — Z85841 Personal history of malignant neoplasm of brain: Secondary | ICD-10-CM

## 2020-06-25 DIAGNOSIS — E785 Hyperlipidemia, unspecified: Secondary | ICD-10-CM | POA: Diagnosis not present

## 2020-06-25 DIAGNOSIS — N183 Chronic kidney disease, stage 3 unspecified: Secondary | ICD-10-CM

## 2020-06-25 DIAGNOSIS — R413 Other amnesia: Secondary | ICD-10-CM

## 2020-06-25 DIAGNOSIS — F0390 Unspecified dementia without behavioral disturbance: Secondary | ICD-10-CM | POA: Diagnosis not present

## 2020-06-25 DIAGNOSIS — Z85118 Personal history of other malignant neoplasm of bronchus and lung: Secondary | ICD-10-CM

## 2020-06-25 NOTE — Patient Instructions (Addendum)
Licensed Clinical Social Worker Visit Information  Goals we discussed today:    Client said she wants help in improving her living situation and wants to look for other housing options in area. (pt-stated)          Current Barriers:   Financial constraints of client with chronic diagnoses of CKD, Osteoporosis, Hx Cancer of Lung, Hx of Cancer of the brain, Hyperlipidemia, and Memory impairment  Limited social support  Clinical Social Work Clinical Goal(s):   Over next 30 days, client will work with LCSW to address concerns related to housing issues of client and discuss housing options for client in the area.   Interventions:   Talked with Heron Sabins, son of client, about client needs  Encouraged Heron Sabins, son of client, to call LCSW to discuss psychosocial needs of client  Talked with Heron Sabins and Ramond Craver about FL-2, H and P and Med list documents needed for person to be considered for admission to ALF or SNF facility  Talked with [name] and Theadora Rama about memory issues of client  Talked with [name] and Theadora Rama about daily care needs of client  Encouraged [name] or Theadora Rama to call RNCM or LCSW as needed for CCM support for client  Patient Self Care Activities:   Attends all scheduled provider appointments   Performs ADL's independently  Contacts [name], caregiver to discuss client needs  Plan:   Patient will contact LCSW to discuss psychosical needs of client   LCSW will call Conleigh Heinlein or Ramond Craver in four weeks to assess client housing needs  Client to call 911 for emergency assistance as needed.   Client to attend scheduled client medical appointments  Initial goal documentation    Follow Up Plan: LCSW will call Heron Sabins or Ramond Craver in next 4 weeks to assess client housing needs at that time  Materials Provided: No  The patient/Rodney Abdo, son of patient verbalized understanding of instructions provided  today and declined a print copy of patient instruction materials.   Norva Riffle.Loyce Flaming MSW, LCSW Licensed Clinical Social Worker [facility] Care Management [phone]

## 2020-06-25 NOTE — Chronic Care Management (AMB) (Signed)
  Chronic Care Management    Clinical Social Work Follow Up Note  06/25/2020 Name: Emri Sample MRN: [id number] DOB: Sep 15, 1950  Suzzette Gasparro Unrein is a 70 y.o. year old female who is a primary care patient of Janora Norlander, DO. The CCM team was consulted for assistance with [employer] .   Review of patient status, including review of consultants reports, other relevant assessments, and collaboration with appropriate care team members and the patient's provider was performed as part of comprehensive patient evaluation and provision of chronic care management services.    SDOH (Social Determinants of Health) assessments performed: No; risk for depression; risk for physical inactivity; risk for stress; risk for tobacco use  [place] Office Visit from 10/05/2019 in [facility]  PHQ-9 Total Score 0      Outpatient Encounter Medications as of 06/25/2020  Medication Sig  . donepezil (ARICEPT) 10 MG tablet Take 1 tablet (10 mg total) by mouth at bedtime. (Needs to be seen before next refill)  . Multiple Vitamin (MULTIVITAMIN) tablet Take 1 tablet by mouth daily.  Marland Kitchen OVER THE COUNTER MEDICATION Take 1 tablet by mouth at bedtime. Sleep 3 - melatonin   No facility-administered encounter medications on file as of 06/25/2020.     Goals    .  Client said she wants help in improving her living situation and wants to look for other housing options in area. (pt-stated)      Current Barriers:  . Financial constraints of client with chronic diagnoses of CKD, Osteoporosis, Hx Cancer of Lung, Hx of Cancer of the brain, Hyperlipidemia, and Memory impairment . Limited social support  Clinical Social Work Clinical Goal(s):  Marland Kitchen Over next 30 days, client will work with LCSW to address concerns related to housing issues of client and discuss housing options for client in the area.   Interventions:   Talked with Heron Sabins, son of client, about client needs . Encouraged  Heron Sabins, son of client, to call LCSW to discuss psychosocial needs of client . Talked with Heron Sabins and Theadora Rama [place] about FL-2, H and P and Med list documents needed for person to be considered for admission to ALF or SNF facility . Talked with [name] and [place] about memory issues of client . Talked with [name] and [place] about daily care needs of client . Encouraged [name] or Theadora Rama to call RNCM or LCSW as needed for CCM support for client  Patient Self Care Activities:  . Attends all scheduled provider appointments  . Performs ADL's independently . Contacts [name], caregiver to discuss client needs  Plan:  . Patient will contact LCSW to discuss psychosical needs of client  . LCSW will call Charlissa Petros or Ramond Craver in four weeks to assess client housing needs . Client to call 911 for emergency assistance as needed.  . Client to attend scheduled client medical appointments  Initial goal documentation    Follow Up Plan: LCSW will call Heron Sabins or Ramond Craver in next 4 weeks to assess client housing needs at that time  Norva Riffle.Forrest MSW, LCSW Licensed Clinical Social Worker [facility] Care Management [phone]

## 2020-06-27 ENCOUNTER — Ambulatory Visit: Payer: Medicare Other | Admitting: *Deleted

## 2020-06-27 DIAGNOSIS — R413 Other amnesia: Secondary | ICD-10-CM

## 2020-06-27 DIAGNOSIS — E785 Hyperlipidemia, unspecified: Secondary | ICD-10-CM | POA: Diagnosis not present

## 2020-06-27 DIAGNOSIS — R443 Hallucinations, unspecified: Secondary | ICD-10-CM

## 2020-06-27 DIAGNOSIS — N183 Chronic kidney disease, stage 3 unspecified: Secondary | ICD-10-CM | POA: Diagnosis not present

## 2020-06-27 DIAGNOSIS — F0390 Unspecified dementia without behavioral disturbance: Secondary | ICD-10-CM

## 2020-06-27 DIAGNOSIS — Z85118 Personal history of other malignant neoplasm of bronchus and lung: Secondary | ICD-10-CM

## 2020-06-27 DIAGNOSIS — F03A0 Unspecified dementia, mild, without behavioral disturbance, psychotic disturbance, mood disturbance, and anxiety: Secondary | ICD-10-CM

## 2020-06-27 NOTE — Chronic Care Management (AMB) (Deleted)
  Chronic Care Management   Note  06/27/2020 Name: Jamie [name] MRN: [id number] DOB: 12/06/1950  Consulted by LCSW regarding recent telephone visit with family members. They are considering SNF placement due to worsening cognition and safety concerns.   I have been unable to reach patient by telephone but will reschedule telephone outreach to review nursing care needs.   Follow up plan: Telephone follow up appointment with care management team member scheduled for: 07/10/20 with RNCM  [name], BSN, RN-BC [place] / [place] Management Direct Dial: [phone]

## 2020-06-27 NOTE — Patient Instructions (Addendum)
Follow up plan: Follow-up with LCSW regarding psychosocial concerns and SNF placement assistance Reach out to [facility] with any nursing care needs  [name], BSN, RN-BC [employer] / [place] Management Direct Dial: [phone]

## 2020-06-28 ENCOUNTER — Other Ambulatory Visit: Payer: Self-pay | Admitting: Family Medicine

## 2020-06-28 DIAGNOSIS — R413 Other amnesia: Secondary | ICD-10-CM

## 2020-07-01 ENCOUNTER — Telehealth: Payer: Self-pay

## 2020-07-02 NOTE — Telephone Encounter (Signed)
LMTCB

## 2020-07-08 ENCOUNTER — Ambulatory Visit: Payer: Medicare Other | Admitting: Internal Medicine

## 2020-07-10 ENCOUNTER — Telehealth: Payer: Medicare Other | Admitting: *Deleted

## 2020-07-10 ENCOUNTER — Telehealth: Payer: Self-pay | Admitting: *Deleted

## 2020-07-10 NOTE — Telephone Encounter (Signed)
  Chronic Care Management   Outreach Note  07/10/2020 Name: Nyhla Mountjoy MRN: [id number] DOB: November 06, 1950  Referred by: Janora Norlander, DO Reason for referral : Chronic Care Management (Unsuccessful outreach)   A second unsuccessful follow-up Telephone Visit was attempted today. The patient was referred to the case management team for assistance with care management and care coordination.   Chart reviewed prior to outreach. Last telephone call to [facility] documents that patient has moved in with family in [place], [place]. The last time I talked with patient's daughter she did mention the possibility of Jamie [name] moving in with them.   Left HIPAA message requesting call back to confirm that patient has moved and verify that she will be finding a PCP closer to her current home. If so, patient will be removed from [facility] program.  Interventions and Plan . Chart reviewed in preparation for telephone visit . A HIPAA compliant phone message was left for the patient providing contact information and requesting a return call.   [name], BSN, RN-BC Embedded Chronic Care Manager [facility] / [place] Management Direct Dial: [phone]

## 2020-07-18 ENCOUNTER — Telehealth: Payer: Medicare Other | Admitting: *Deleted

## 2020-07-24 ENCOUNTER — Telehealth: Payer: Medicare Other

## 2020-07-24 NOTE — Chronic Care Management (AMB) (Signed)
  Chronic Care Management   Note  07/24/2020 Name: Jamie [name] MRN: [id number] DOB: 31-Oct-1950  Consulted by LCSW regarding recent telephone visit with family members. They are considering SNF placement due to worsening cognition and safety concerns. Chart reviewed and there were no nursing needs that need to be addressed at this time. Family may need continued LCSW or Care Guide assistance for SNF placement support. An unsuccessful outreach to patient's family was attempted.  Follow up plan:  Follow-up with LCSW regarding psychosocial needs and guidance on SNF placement Reach out to [facility] with any nursing care needs  [name], BSN, RN-BC [place] / [place] Management Direct Dial: [phone]

## 2020-08-21 IMAGING — CT CT HEAD W/O CM
3 series · 16 of 47 positions shown, 19 images · non-contrast
Comparison: October 30, 2017 CT scan

CLINICAL DATA: Acute mental status change.

EXAM:
CT HEAD WITHOUT CONTRAST
TECHNIQUE: Contiguous axial images were obtained from the base of the skull
through the vertex without intravenous contrast.

[Series 2: head trauma wo · axial · 0.43mm/px · z∈[+1189,+1329]mm · 10 of 34 slices shown, 13 images]
[im 3/34  brain]
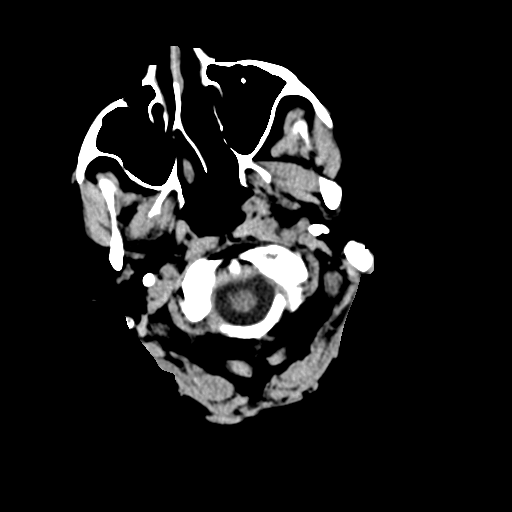
[im 3/34  bone]
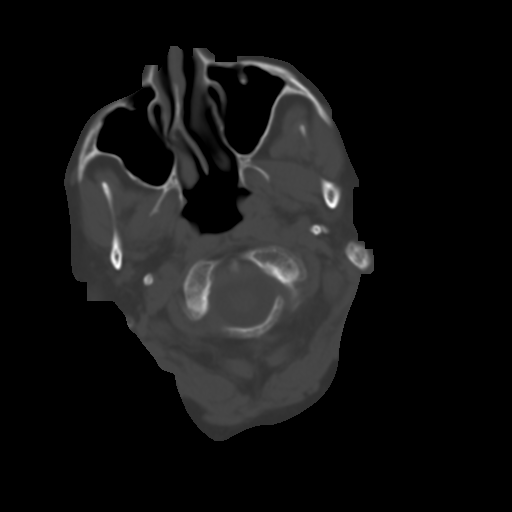
[im 6/34  brain]
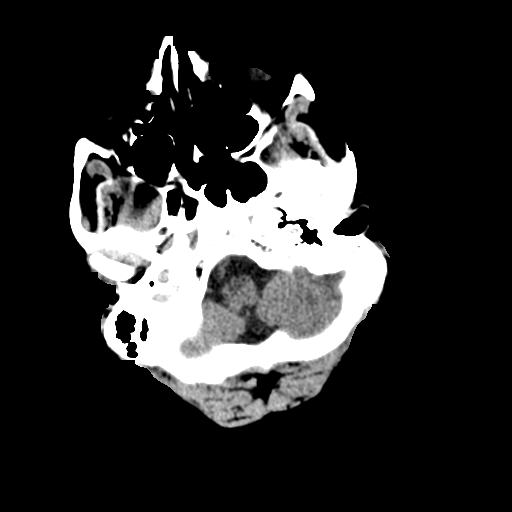
[im 10/34  brain]
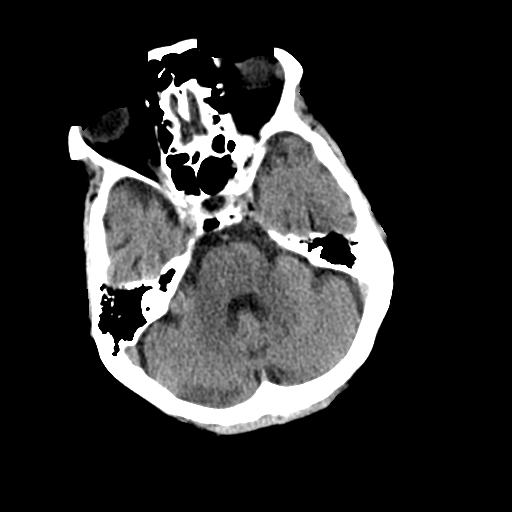
[im 12/34  brain]
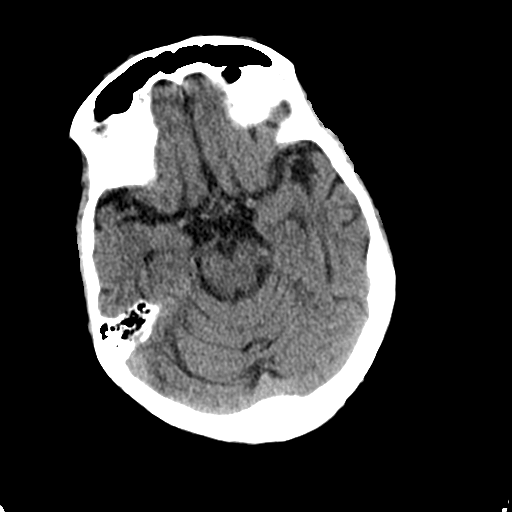
[im 15/34  brain]
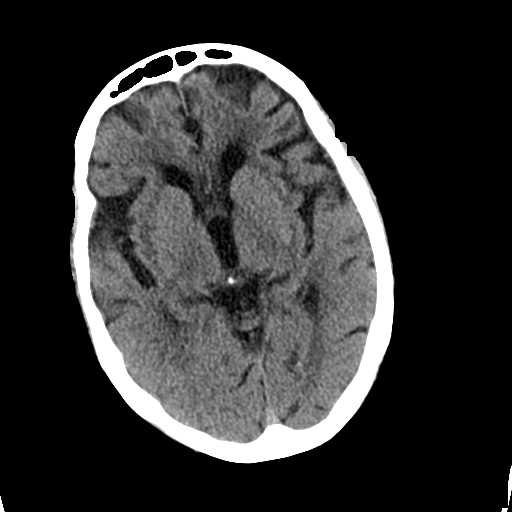
[im 15/34  bone]
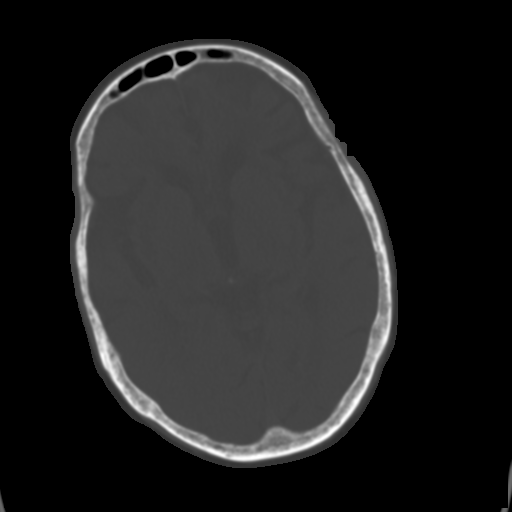
[im 19/34  brain]
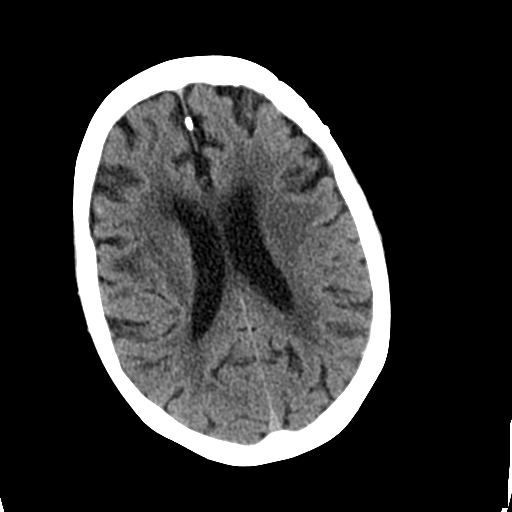
[im 22/34  brain]
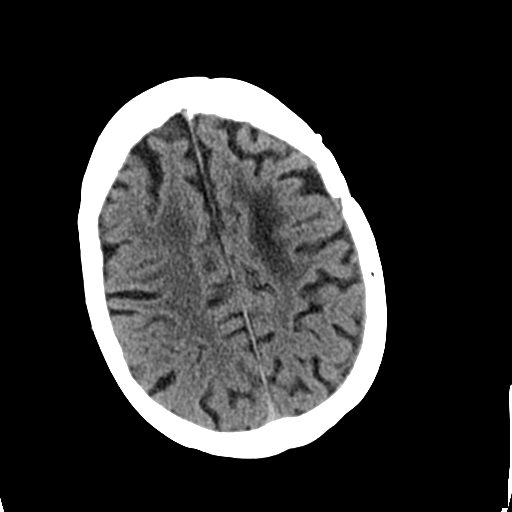
[im 26/34  brain]
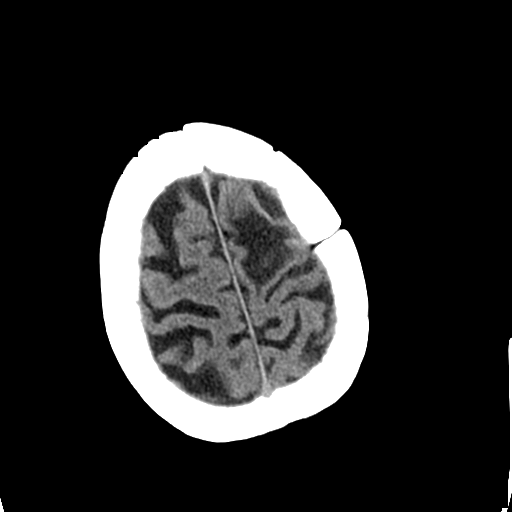
[im 28/34  brain]
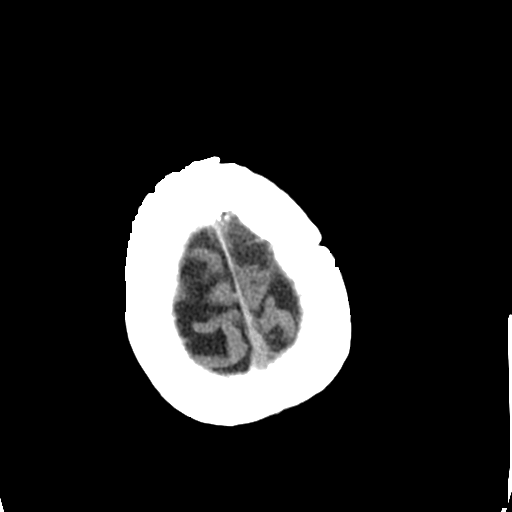
[im 28/34  bone]
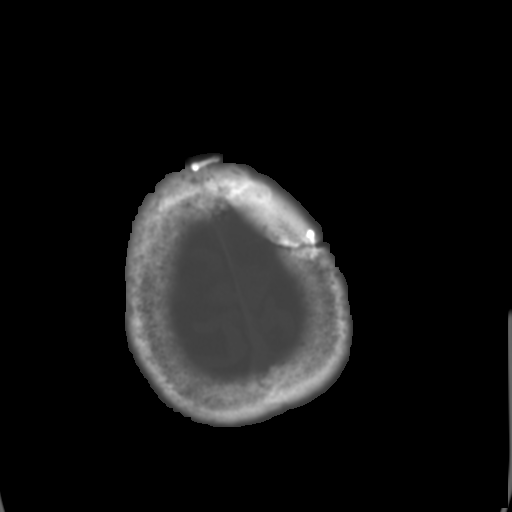
[im 31/34  brain]
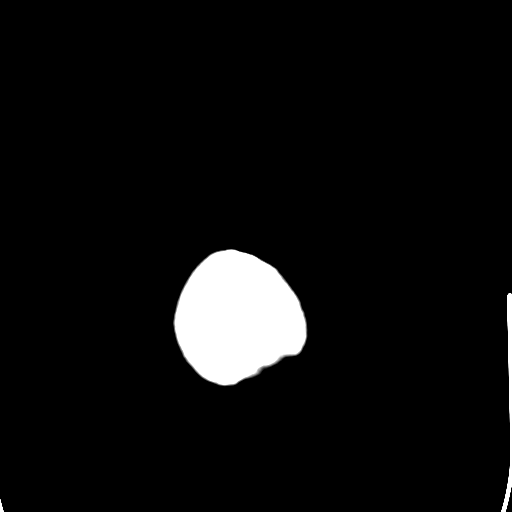

[Series 4: coronal soft tissue · coronal · 0.32mm/px · 3 of 77 slices shown]
[im 26/77  brain]
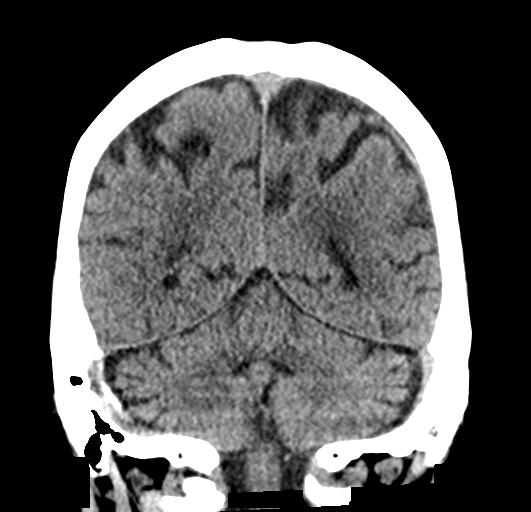
[im 34/77  brain]
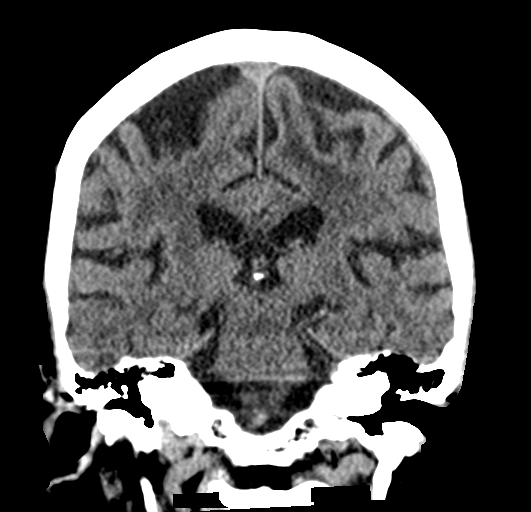
[im 43/77  brain]
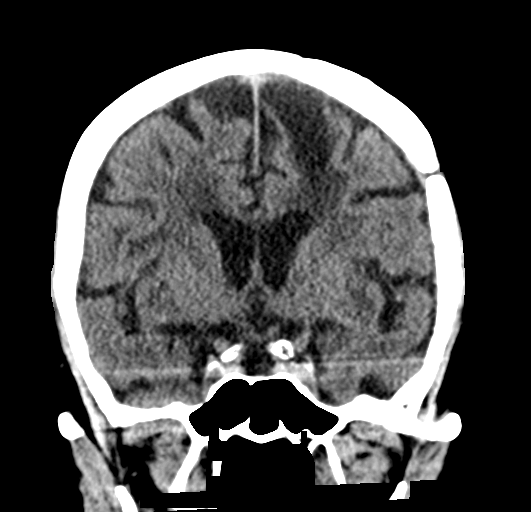

[Series 5: sagittal soft tissue · sagittal · 0.36mm/px · 3 of 55 slices shown]
[im 19/55  brain]
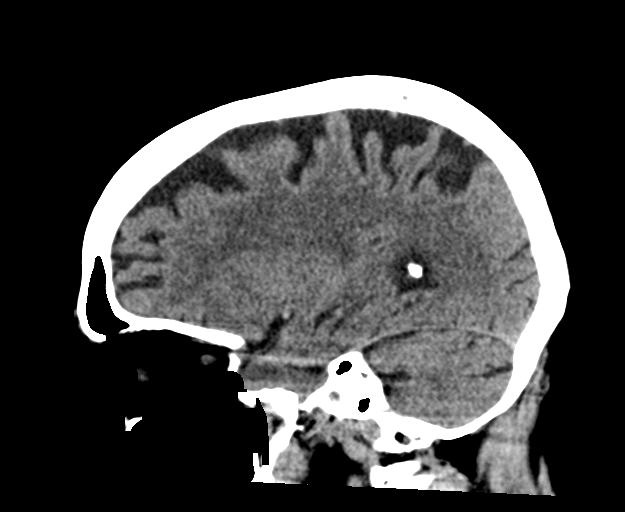
[im 28/55  brain]
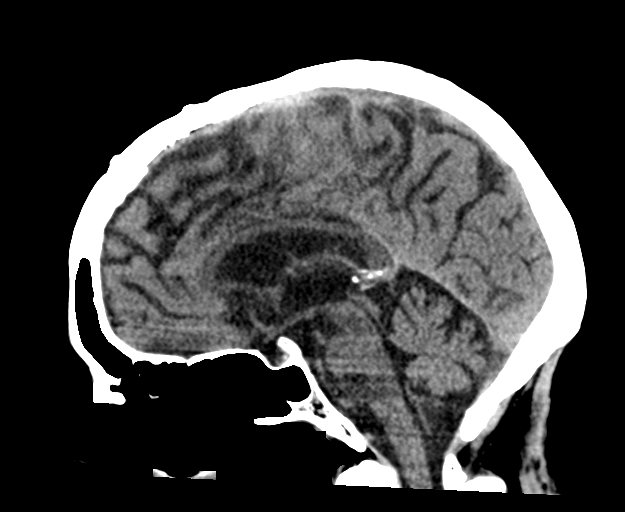
[im 37/55  brain]
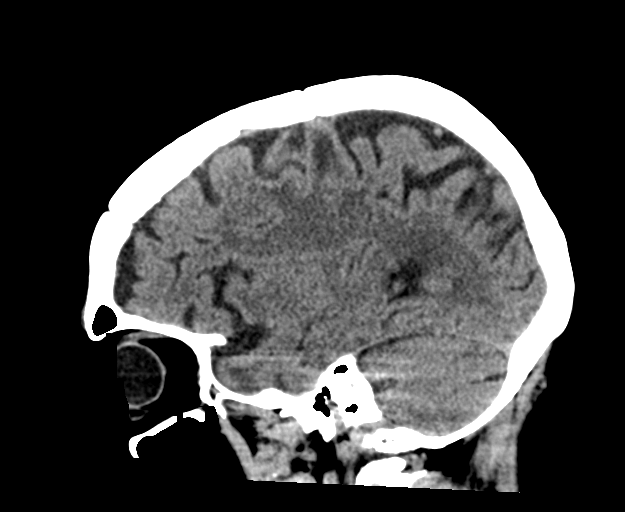

[16 of 47 positions shown; findings below may reference images not displayed]

FINDINGS: Brain: No subdural, epidural, or subarachnoid hemorrhage.
Encephalomalacia at the left frontal vertex is stable. Cerebellum,
brainstem, and basal cisterns are normal. Ventricles and sulci are
unremarkable. White matter changes are stable. No acute cortical
ischemia infarct. No mass effect or midline shift.

Vascular: No hyperdense vessel or unexpected calcification.

Skull: Previous left craniotomy at the vertex frontally.

Sinuses/Orbits: Opacification of left mastoid air cells posteriorly
and inferiorly are stable. Paranasal sinuses, mastoid air cells, and
middle ears are otherwise normal.

Other: None.
IMPRESSION: No acute intracranial abnormalities identified. Chronic white matter
changes and left frontal vertex encephalomalacia.

## 2020-08-29 ENCOUNTER — Ambulatory Visit (INDEPENDENT_AMBULATORY_CARE_PROVIDER_SITE_OTHER): Payer: Medicare Other | Admitting: Licensed Clinical Social Worker

## 2020-08-29 DIAGNOSIS — Z85118 Personal history of other malignant neoplasm of bronchus and lung: Secondary | ICD-10-CM

## 2020-08-29 DIAGNOSIS — Z9181 History of falling: Secondary | ICD-10-CM

## 2020-08-29 DIAGNOSIS — M810 Age-related osteoporosis without current pathological fracture: Secondary | ICD-10-CM

## 2020-08-29 DIAGNOSIS — E785 Hyperlipidemia, unspecified: Secondary | ICD-10-CM | POA: Diagnosis not present

## 2020-08-29 DIAGNOSIS — R413 Other amnesia: Secondary | ICD-10-CM

## 2020-08-29 DIAGNOSIS — Z85841 Personal history of malignant neoplasm of brain: Secondary | ICD-10-CM

## 2020-08-29 NOTE — Chronic Care Management (AMB) (Signed)
Chronic Care Management    Clinical Social Work Note  08/29/2020 Name: Anupama Piehl MRN: [id number] DOB: [birth year]  Kate Larock Lippe is a 70 y.o. year old female who is a primary care patient of Janora Norlander, DO. The CCM team was consulted to assist the patient with chronic disease management and/or care coordination needs related to: [employer] .   Engaged with patient Pandora Leiter of patient, Jadia Capers, by telephone for follow up visit in response to provider referral for social work chronic care management and care coordination services.   Consent to Services:  The patient was given information about Chronic Care Management services, agreed to services, and gave verbal consent prior to initiation of services.  Please see initial visit note for detailed documentation.   Patient agreed to services and consent obtained.   Assessment: Review of patient past medical history, allergies, medications, and health status, including review of relevant consultants reports was performed today as part of a comprehensive evaluation and provision of chronic care management and care coordination services.     SDOH (Social Determinants of Health) assessments and interventions performed:  SDOH Interventions   Flowsheet Row Most Recent Value  SDOH Interventions   Depression Interventions/Treatment  Medication       Advanced Directives Status: See Vynca application for related entries.  CCM Care Plan  Allergies  Allergen Reactions  . Fosamax [Alendronate Sodium] Other (See Comments)    "unknown"  . Sulfonamide Derivatives     REACTION: Hives, tongue swells    Outpatient Encounter Medications as of 08/29/2020  Medication Sig  . donepezil (ARICEPT) 10 MG tablet Take 1 tablet (10 mg total) by mouth at bedtime.  . Multiple Vitamin (MULTIVITAMIN) tablet Take 1 tablet by mouth daily.  Marland Kitchen OVER THE COUNTER MEDICATION Take 1 tablet by mouth at bedtime. Sleep 3 - melatonin   No  facility-administered encounter medications on file as of 08/29/2020.    Patient Active Problem List   Diagnosis Date Noted  . Acute respiratory failure with hypoxia ([place]) 12/27/2019  . Acute metabolic encephalopathy   . Pneumonia due to COVID-19 virus 12/26/2019  . Hypocalcemia 12/26/2019  . Leukopenia 12/26/2019  . Dementia without behavioral disturbance ([place]) 12/26/2019  . Seasonal allergic rhinitis due to pollen 09/06/2018  . Folate deficiency 09/06/2018  . Subdural hematoma ([place]) 01/22/2018  . Acute encephalopathy 01/21/2018  . CKD (chronic kidney disease), stage III 10/31/2017  . Altered mental status 10/30/2017  . Closed nondisplaced comminuted fracture of shaft of right humerus 07/25/2017  . Lumbar radiculopathy 06/21/2017  . Multiple falls 06/21/2017  . Ataxia 06/21/2017  . Claustrophobia 06/21/2017  . Imbalance 06/21/2017  . Memory loss 02/16/2017  . History of therapeutic radiation 08/03/2016  . Osteoporosis 10/09/2014  . At high risk for falls 10/09/2014  . Hx of cancer of lung 08/01/2014  . Hx of brain cancer 08/01/2014  . Personal history of colonic polyps - adenomas 07/18/2013  . Memory impairment 03/22/2011  . History of cancer 03/22/2011  . Hyperlipidemia 03/22/2011    Conditions to be addressed/monitored: Monitor client completion of ADLs, as able; monitor housing needs of client   Care Plan : LCSW care plan  Updates made by Katha Cabal, LCSW since 08/29/2020 12:00 AM    Problem: Coping Skills (General Plan of Care)     Goal: Coping Skills Enhanced: Manage memory issues ; complete ADLs daily as able   Start Date: 08/29/2020  Expected End Date: 11/29/2020  This Visit's Progress: On  track  Priority: Medium  Note:   Current barriers:   . Patient in need of assistance with connecting to community resources for possible help with managing in home care needs of client . Patient is unable to independently navigate community resource options without care  coordination support . Dementia . Some mobility issues  Clinical Goals:  patient will work with SW in next 30 days to address concerns related to memory challenges of client Patient will work with SW in next 30 days to address concerns related to client completion of ADLs and other daily activities  Clinical Interventions:  . Collaboration with Janora Norlander, DO regarding development and update of comprehensive plan of care as evidenced by provider attestation and co-signature . Assessment of needs, barriers  of client . Talked with Heron Sabins, son of client, about client needs . Talked with [name] about fact that client is staying with him at residence in [place] [name] said that this is temporary arrangement and that when he finishes his work in  area that he and client will return to [place], [place]) . Talked with [name] about pain issues of client . Talked with [name] about mobility issues of client . Talked with [name] about appetite of client . Talked with [name] about upcoming client appointments . Talked with [name] about medical care for client . Talked with [name] about transport needs of client  Client Coping Skills:  Takes medications as prescribed Attends scheduled medical appointments Has family support from her son and grandchildren  Client deficits: Memory issues Dementia Some mobility issues  Patient Goals:  In next 30 days, client will attend all scheduled medical appointments In next 30 days client will call RNCM or LCSW as needed for CCM support In next 30 days client will allow time for rest and relaxation for client -  Follow Up Plan: LCSW to call client on 10/08/20     Norva Riffle.Oney Folz MSW, LCSW Licensed Clinical Social Worker [facility] Care Management [phone]

## 2020-08-29 NOTE — Patient Instructions (Signed)
Visit Information  PATIENT GOALS: Goals Addressed            This Visit's Progress   . Protect My Health;Manage memory issues; Complete ADLs daily as able       Timeframe:  Short-Term Goal Priority:  Medium Progress: On Track Start Date:             08/29/20                Expected End Date:           11/29/20            Follow Up Date  10/08/20   Protect My Health (Patient) Manage memory issues; Complete ADLs daily as able    Why is this important?    Screening tests can find diseases early when they are easier to treat.   Your doctor or nurse will talk with you about which tests are important for you.   Getting shots for common diseases like the flu and shingles will help prevent them.     Client Coping Skills:  Takes medications as prescribed Attends scheduled medical appointments Has family support from her son and grandchildren  Client deficits: Memory issues Dementia Some mobility issues  Patient Goals:  In next 30 days, client will attend all scheduled medical appointments In next 30 days client will call RNCM or LCSW as needed for CCM support In next 30 days client will allow time for rest and relaxation for client -  Follow Up Plan: LCSW to call client on 10/08/20       Norva Riffle.Eamon Tantillo MSW, LCSW Licensed Clinical Social Worker [facility] Care Management [phone]

## 2020-09-03 ENCOUNTER — Ambulatory Visit: Payer: Medicare Other | Admitting: Family Medicine

## 2020-09-05 ENCOUNTER — Encounter: Payer: Self-pay | Admitting: Family Medicine

## 2020-09-13 ENCOUNTER — Other Ambulatory Visit: Payer: Self-pay | Admitting: Family Medicine

## 2020-09-18 ENCOUNTER — Telehealth: Payer: Self-pay | Admitting: Family Medicine

## 2020-09-18 ENCOUNTER — Other Ambulatory Visit: Payer: Self-pay | Admitting: *Deleted

## 2020-09-18 DIAGNOSIS — E7800 Pure hypercholesterolemia, unspecified: Secondary | ICD-10-CM

## 2020-09-18 MED ORDER — ROSUVASTATIN CALCIUM 10 MG PO TABS: 10.0000 mg | ORAL_TABLET | Freq: Every day | ORAL | 0 refills | Status: DC

## 2020-09-18 NOTE — Telephone Encounter (Signed)
crestor sent for 90 day - she is going to find a PCP in [facility]

## 2020-09-18 NOTE — Telephone Encounter (Signed)
  Prescription Request  09/18/2020  What is the name of the medication or equipment? CRESTOR  Have you contacted your pharmacy to request a refill? (if applicable) No she is going to use a different pharmacy than the one she usually uses, she is now living with her son and daughter in law 3 hours away  Which pharmacy would you like this sent to? [facility] [address] [place]   Pt daughter in law also wants to know how to get her a new PCP   Patient notified that their request is being sent to the clinical staff for review and that they should receive a response within 2 business days.

## 2020-10-08 ENCOUNTER — Telehealth: Payer: Medicare Other

## 2020-10-08 ENCOUNTER — Other Ambulatory Visit: Payer: Self-pay | Admitting: Family Medicine

## 2020-10-08 DIAGNOSIS — R413 Other amnesia: Secondary | ICD-10-CM

## 2020-11-04 ENCOUNTER — Other Ambulatory Visit: Payer: Self-pay | Admitting: Family Medicine

## 2020-11-04 DIAGNOSIS — R413 Other amnesia: Secondary | ICD-10-CM

## 2020-11-21 ENCOUNTER — Ambulatory Visit (INDEPENDENT_AMBULATORY_CARE_PROVIDER_SITE_OTHER): Payer: Medicare Other | Admitting: Licensed Clinical Social Worker

## 2020-11-21 ENCOUNTER — Encounter: Payer: Self-pay | Admitting: Licensed Clinical Social Worker

## 2020-11-21 DIAGNOSIS — M810 Age-related osteoporosis without current pathological fracture: Secondary | ICD-10-CM | POA: Diagnosis not present

## 2020-11-21 DIAGNOSIS — Z85118 Personal history of other malignant neoplasm of bronchus and lung: Secondary | ICD-10-CM

## 2020-11-21 DIAGNOSIS — E785 Hyperlipidemia, unspecified: Secondary | ICD-10-CM

## 2020-11-21 DIAGNOSIS — N183 Chronic kidney disease, stage 3 unspecified: Secondary | ICD-10-CM

## 2020-11-21 DIAGNOSIS — R413 Other amnesia: Secondary | ICD-10-CM

## 2020-11-21 DIAGNOSIS — Z9181 History of falling: Secondary | ICD-10-CM

## 2020-11-21 DIAGNOSIS — Z85841 Personal history of malignant neoplasm of brain: Secondary | ICD-10-CM

## 2020-11-21 NOTE — Patient Instructions (Signed)
Visit Information  PATIENT GOALS:  Goals Addressed             This Visit's Progress    Manage My Emotions       Protect My Health;Manage memory issues; Complete ADLs daily as able       Timeframe:  Short-Term Goal Priority:  Medium Progress: On Track Start Date:             11/21/20                Expected End Date:           02/20/21            Follow Up Date  01/01/21   Protect My Health (Patient) Manage memory issues; Complete ADLs daily as able    Why is this important?   Screening tests can find diseases early when they are easier to treat.  Your doctor or nurse will talk with you about which tests are important for you.  Getting shots for common diseases like the flu and shingles will help prevent them.     Client Coping Skills:  Takes medications as prescribed Attends scheduled medical appointments Has family support from her son and grandchildren  Client deficits: Memory issues Dementia Some mobility issues  Patient Goals:  In next 30 days, client will attend all scheduled medical appointments In next 30 days client will call RNCM or LCSW as needed for CCM support In next 30 days client will allow time for rest and relaxation for client -  Follow Up Plan: LCSW to call client , son of client, or daughter in law of client on 01/01/21 to assess client needs     Norva Riffle.Olman Yono MSW, LCSW Licensed Clinical Social Worker [facility] Care Management [phone]

## 2020-11-21 NOTE — Chronic Care Management (AMB) (Signed)
Chronic Care Management    Clinical Social Work Note  11/21/2020 Name: Jamie [name] MRN: [id number] DOB: Aug 18, 1950  Jamie [name] is a 70 y.o. year old female who is a primary care patient of Jamie Norlander, DO. The CCM team was consulted to assist the patient with chronic disease management and/or care coordination needs related to: [employer] .   Engaged with patient / daughter in law of patient, Jamie [name], by telephone for follow up visit in response to provider referral for social work chronic care management and care coordination services.   Consent to Services:  The patient was given information about Chronic Care Management services, agreed to services, and gave verbal consent prior to initiation of services.  Please see initial visit note for detailed documentation.   Patient agreed to services and consent obtained.   Assessment: Review of patient past medical history, allergies, medications, and health status, including review of relevant consultants reports was performed today as part of a comprehensive evaluation and provision of chronic care management and care coordination services.     SDOH (Social Determinants of Health) assessments and interventions performed: Client has history of smoking cigarettes. She quit smoking cigarettes in 2001  SDOH Interventions    Flowsheet Row Most Recent Value  SDOH Interventions   Depression Interventions/Treatment  Medication        Advanced Directives Status: See Vynca application for related entries.  CCM Care Plan  Allergies  Allergen Reactions   Fosamax [Alendronate Sodium] Other (See Comments)    "unknown"   Sulfonamide Derivatives     REACTION: Hives, tongue swells    Outpatient Encounter Medications as of 11/21/2020  Medication Sig   donepezil (ARICEPT) 10 MG tablet Take 1 tablet (10 mg total) by mouth at bedtime. (NEEDS TO BE SEEN BEFORE NEXT REFILL)   Multiple Vitamin (MULTIVITAMIN) tablet  Take 1 tablet by mouth daily.   rosuvastatin (CRESTOR) 10 MG tablet Take 1 tablet (10 mg total) by mouth at bedtime.   No facility-administered encounter medications on file as of 11/21/2020.    Patient Active Problem List   Diagnosis Date Noted   Acute respiratory failure with hypoxia (Jamie [name]) 62/95/2841   Acute metabolic encephalopathy    Pneumonia due to COVID-19 virus 12/26/2019   Hypocalcemia 12/26/2019   Leukopenia 12/26/2019   Dementia without behavioral disturbance ([place]) 12/26/2019   Seasonal allergic rhinitis due to pollen 09/06/2018   Folate deficiency 09/06/2018   Subdural hematoma ([place]) 01/22/2018   Acute encephalopathy 01/21/2018   CKD (chronic kidney disease), stage III 10/31/2017   Altered mental status 10/30/2017   Closed nondisplaced comminuted fracture of shaft of right humerus 07/25/2017   Lumbar radiculopathy 06/21/2017   Multiple falls 06/21/2017   Ataxia 06/21/2017   Claustrophobia 06/21/2017   Imbalance 06/21/2017   Memory loss 02/16/2017   History of therapeutic radiation 08/03/2016   Osteoporosis 10/09/2014   At high risk for falls 10/09/2014   Hx of cancer of lung 08/01/2014   Hx of brain cancer 08/01/2014   Personal history of colonic polyps - adenomas 07/18/2013   Memory impairment 03/22/2011   History of cancer 03/22/2011   Hyperlipidemia 03/22/2011    Conditions to be addressed/monitored: monitor client management of memory issues; monitor client completion of ADLs as she is able   Care Plan : LCSW care plan  Updates made by Jamie Cabal, LCSW since 11/21/2020 12:00 AM     Problem: Coping Skills (General Plan of Care)  Goal: Coping Skills Enhanced: Manage memory issues ; complete ADLs daily as able   Start Date: 11/21/2020  Expected End Date: 02/20/2021  This Visit's Progress: On track  Recent Progress: On track  Priority: Medium  Note:   Current barriers:   Patient in need of assistance with connecting to community resources for  possible help with managing in home care needs of client Patient is unable to independently navigate community resource options without care coordination support Dementia Some mobility issues Some pain issues  Clinical Goals:  patient will work with SW in next 30 days to address concerns related to memory challenges of client Patient will work with SW in next 30 days to address concerns related to client completion of ADLs and other daily activities  Clinical Interventions:  Collaboration with Jamie Norlander, DO regarding development and update of comprehensive plan of care as evidenced by provider attestation and co-signature Assessment of needs, barriers  of client Talked with Jamie [name], daughter in law of client, about client needs Talked with Jamie [name] about fact that client is staying with Jamie [name] and Jamie [name] at residence in [place] .  Talked with Jamie [name] about pain issues of client Talked with Jamie [name] about mobility issues of client (client uses a walker to help her ambulate) Talked with Jamie [name] about appetite of client Talked with Jamie [name] about medical care for client Jamie [name] said she and client son, Jamie [name], are trying to locate a PCP for client in the [place], [place] area Talked with Jamie [name] about exercise routine for client Jamie [name] said it is difficult to get client to exercise. Client does complain of pain issues occasionally with mobility) Talked with Jamie [name] about client mood. She said client has occasional hallucinations Talked with Jamie [name] about relaxation techniques of client (client likes to watch soap operas on TV) Talked with Jamie [name] about client daily routine.  Jamie [name] and LCSW spoke of the importance of client sticking to her daily routine to allow her to be calmer and know what to expect generally with her daily schedule Talked with Jamie [name] about medication administration for client. Jamie [name] said she is working to see if pharmacy can individually pack client  medications prescribed.  Encouraged Jamie [name] or Crocker to call RNCM or LCSW as needed for CCM support for client  Client Coping Skills:  Takes medications as prescribed Attends scheduled medical appointments Has family support from her son and grandchildren  Client deficits: Memory issues Dementia Some mobility issues  Patient Goals:  In next 30 days, client will attend all scheduled medical appointments In next 30 days client will call RNCM or LCSW as needed for CCM support In next 30 days client will allow time for rest and relaxation for client -  Follow Up Plan: LCSW to call client , son of client, or daughter in law of client on 01/01/21 to assess needs of client      Norva Riffle.Desiraye Rolfson MSW, LCSW Licensed Clinical Social Worker [facility] Care Management [phone]

## 2020-12-07 ENCOUNTER — Other Ambulatory Visit: Payer: Self-pay | Admitting: Family Medicine

## 2020-12-07 DIAGNOSIS — R413 Other amnesia: Secondary | ICD-10-CM

## 2020-12-08 NOTE — Telephone Encounter (Signed)
[place]. NTBS 30 days given 11/04/20

## 2020-12-09 ENCOUNTER — Other Ambulatory Visit: Payer: Self-pay | Admitting: Family Medicine

## 2020-12-09 DIAGNOSIS — E7800 Pure hypercholesterolemia, unspecified: Secondary | ICD-10-CM

## 2020-12-17 ENCOUNTER — Telehealth: Payer: Self-pay | Admitting: Family Medicine

## 2020-12-17 DIAGNOSIS — R413 Other amnesia: Secondary | ICD-10-CM

## 2020-12-17 MED ORDER — DONEPEZIL HCL 10 MG PO TABS: 10.0000 mg | ORAL_TABLET | Freq: Every day | ORAL | 0 refills | Status: DC

## 2020-12-17 NOTE — Telephone Encounter (Signed)
  Prescription Request  12/17/2020  Is this a "Controlled Substance" medicine? no Have you seen your PCP in the last 2 weeks? no If YES, route message to pool  -  If NO, patient needs to be scheduled for appointment.  What is the name of the medication or equipment? donepezil (ARICEPT) 10 MG tablet  Have you contacted your pharmacy to request a refill?  NBTB but pt leaves 3 hours away now and she does not have a new pcp. Can another refill be called in? Lajuana Ripple is not available soon. Can she see someone else? Please call back  Which pharmacy would you like this sent to? [facility] phone [phone]   Patient notified that their request is being sent to the clinical staff for review and that they should receive a response within 2 business days.

## 2020-12-17 NOTE — Telephone Encounter (Signed)
1  sent in

## 2020-12-17 NOTE — Telephone Encounter (Signed)
Spoke with daughter - trying to get DR in new area.

## 2021-01-01 ENCOUNTER — Telehealth: Payer: Medicare Other

## 2021-01-09 ENCOUNTER — Other Ambulatory Visit: Payer: Self-pay | Admitting: Family Medicine

## 2021-01-09 DIAGNOSIS — E7800 Pure hypercholesterolemia, unspecified: Secondary | ICD-10-CM

## 2021-01-09 DIAGNOSIS — R413 Other amnesia: Secondary | ICD-10-CM

## 2021-01-14 ENCOUNTER — Ambulatory Visit: Payer: Medicare Other

## 2021-01-15 ENCOUNTER — Ambulatory Visit (INDEPENDENT_AMBULATORY_CARE_PROVIDER_SITE_OTHER): Payer: Medicare Other

## 2021-01-15 VITALS — Ht 67.0 in | Wt 170.0 lb

## 2021-01-15 DIAGNOSIS — Z0001 Encounter for general adult medical examination with abnormal findings: Secondary | ICD-10-CM

## 2021-01-15 DIAGNOSIS — Z0000 Encounter for general adult medical examination without abnormal findings: Secondary | ICD-10-CM

## 2021-01-15 DIAGNOSIS — F0390 Unspecified dementia without behavioral disturbance: Secondary | ICD-10-CM | POA: Diagnosis not present

## 2021-01-15 NOTE — Patient Instructions (Signed)
Ms. Jamie [name] , Thank you for taking time to come for your Medicare Wellness Visit. I appreciate your ongoing commitment to your health goals. Please review the following plan we discussed and let me know if I can assist you in the future.   Screening recommendations/referrals: Colonoscopy: Done 07/18/2013 - Repeat in 5 years (discuss with Dr Lajuana Ripple) Mammogram: Done 12/15/2016 - Repeat annually  Bone Density: Done 09/20/2014 - Repeat every 2 years Recommended yearly ophthalmology/optometry visit for glaucoma screening and checkup Recommended yearly dental visit for hygiene and checkup  Vaccinations: Influenza vaccine: Due every fall Pneumococcal vaccine: Due Tdap vaccine: Done 09/13/2012 - Repeat in 10 years  Shingles vaccine: Due.    Covid-19: Due  Advanced directives: Please bring a copy of your health care power of attorney and living will to the office to be added to your chart at your convenience.   Conditions/risks identified: Try to get up and walk for 5-10 minutes every hour.   Next appointment: Follow up in one year for your annual wellness visit    Preventive Care 65 Years and Older, Female Preventive care refers to lifestyle choices and visits with your health care provider that can promote health and wellness. What does preventive care include? A yearly physical exam. This is also called an annual well check. Dental exams once or twice a year. Routine eye exams. Ask your health care provider how often you should have your eyes checked. Personal lifestyle choices, including: Daily care of your teeth and gums. Regular physical activity. Eating a healthy diet. Avoiding tobacco and drug use. Limiting alcohol use. Practicing safe sex. Taking low-dose aspirin every day. Taking vitamin and mineral supplements as recommended by your health care provider. What happens during an annual well check? The services and screenings done by your health care provider during your annual  well check will depend on your age, overall health, lifestyle risk factors, and family history of disease. Counseling  Your health care provider may ask you questions about your: Alcohol use. Tobacco use. Drug use. Emotional well-being. Home and relationship well-being. Sexual activity. Eating habits. History of falls. Memory and ability to understand (cognition). Work and work Statistician. Reproductive health. Screening  You may have the following tests or measurements: Height, weight, and BMI. Blood pressure. Lipid and cholesterol levels. These may be checked every 5 years, or more frequently if you are over 48 years old. Skin check. Lung cancer screening. You may have this screening every year starting at age 22 if you have a 30-pack-year history of smoking and currently smoke or have quit within the past 15 years. Fecal occult blood test (FOBT) of the stool. You may have this test every year starting at age 53. Flexible sigmoidoscopy or colonoscopy. You may have a sigmoidoscopy every 5 years or a colonoscopy every 10 years starting at age 59. Hepatitis C blood test. Hepatitis B blood test. Sexually transmitted disease (STD) testing. Diabetes screening. This is done by checking your blood sugar (glucose) after you have not eaten for a while (fasting). You may have this done every 1-3 years. Bone density scan. This is done to screen for osteoporosis. You may have this done starting at age 49. Mammogram. This may be done every 1-2 years. Talk to your health care provider about how often you should have regular mammograms. Talk with your health care provider about your test results, treatment options, and if necessary, the need for more tests. Vaccines  Your health care provider may recommend certain vaccines, such  as: Influenza vaccine. This is recommended every year. Tetanus, diphtheria, and acellular pertussis (Tdap, Td) vaccine. You may need a Td booster every 10 years. Zoster  vaccine. You may need this after age 41. Pneumococcal 13-valent conjugate (PCV13) vaccine. One dose is recommended after age 49. Pneumococcal polysaccharide (PPSV23) vaccine. One dose is recommended after age 22. Talk to your health care provider about which screenings and vaccines you need and how often you need them. This information is not intended to replace advice given to you by your health care provider. Make sure you discuss any questions you have with your health care provider. Document Released: 05/02/2015 Document Revised: 12/24/2015 Document Reviewed: 02/04/2015 Elsevier Interactive Patient Education  2017 [employer] Prevention in the Home Falls can cause injuries. They can happen to people of all ages. There are many things you can do to make your home safe and to help prevent falls. What can I do on the outside of my home? Regularly fix the edges of walkways and driveways and fix any cracks. Remove anything that might make you trip as you walk through a door, such as a raised step or threshold. Trim any bushes or trees on the path to your home. Use bright outdoor lighting. Clear any walking paths of anything that might make someone trip, such as rocks or tools. Regularly check to see if handrails are loose or broken. Make sure that both sides of any steps have handrails. Any raised decks and porches should have guardrails on the edges. Have any leaves, snow, or ice cleared regularly. Use sand or salt on walking paths during winter. Clean up any spills in your garage right away. This includes oil or grease spills. What can I do in the bathroom? Use night lights. Install grab bars by the toilet and in the tub and shower. Do not use towel bars as grab bars. Use non-skid mats or decals in the tub or shower. If you need to sit down in the shower, use a plastic, non-slip stool. Keep the floor dry. Clean up any water that spills on the floor as soon as it happens. Remove  soap buildup in the tub or shower regularly. Attach bath mats securely with double-sided non-slip rug tape. Do not have throw rugs and other things on the floor that can make you trip. What can I do in the bedroom? Use night lights. Make sure that you have a light by your bed that is easy to reach. Do not use any sheets or blankets that are too big for your bed. They should not hang down onto the floor. Have a firm chair that has side arms. You can use this for support while you get dressed. Do not have throw rugs and other things on the floor that can make you trip. What can I do in the kitchen? Clean up any spills right away. Avoid walking on wet floors. Keep items that you use a lot in easy-to-reach places. If you need to reach something above you, use a strong step stool that has a grab bar. Keep electrical cords out of the way. Do not use floor polish or wax that makes floors slippery. If you must use wax, use non-skid floor wax. Do not have throw rugs and other things on the floor that can make you trip. What can I do with my stairs? Do not leave any items on the stairs. Make sure that there are handrails on both sides of the stairs and use  them. Fix handrails that are broken or loose. Make sure that handrails are as long as the stairways. Check any carpeting to make sure that it is firmly attached to the stairs. Fix any carpet that is loose or worn. Avoid having throw rugs at the top or bottom of the stairs. If you do have throw rugs, attach them to the floor with carpet tape. Make sure that you have a light switch at the top of the stairs and the bottom of the stairs. If you do not have them, ask someone to add them for you. What else can I do to help prevent falls? Wear shoes that: Do not have high heels. Have rubber bottoms. Are comfortable and fit you well. Are closed at the toe. Do not wear sandals. If you use a stepladder: Make sure that it is fully opened. Do not climb a  closed stepladder. Make sure that both sides of the stepladder are locked into place. Ask someone to hold it for you, if possible. Clearly mark and make sure that you can see: Any grab bars or handrails. First and last steps. Where the edge of each step is. Use tools that help you move around (mobility aids) if they are needed. These include: Canes. Walkers. Scooters. Crutches. Turn on the lights when you go into a dark area. Replace any light bulbs as soon as they burn out. Set up your furniture so you have a clear path. Avoid moving your furniture around. If any of your floors are uneven, fix them. If there are any pets around you, be aware of where they are. Review your medicines with your doctor. Some medicines can make you feel dizzy. This can increase your chance of falling. Ask your doctor what other things that you can do to help prevent falls. This information is not intended to replace advice given to you by your health care provider. Make sure you discuss any questions you have with your health care provider. Document Released: 01/30/2009 Document Revised: 09/11/2015 Document Reviewed: 05/10/2014 Elsevier Interactive Patient Education  2017 [employer].

## 2021-01-15 NOTE — Progress Notes (Signed)
Subjective:   Jamie [name] is a 70 y.o. female who presents for Medicare Annual (Subsequent) preventive examination.  Virtual Visit via Telephone Note  I connected with  Jamie [name] on 01/15/21 at  2:00 PM EDT by telephone and verified that I am speaking with the correct person using two identifiers.  Location: Patient: Home Provider: [facility] Persons participating in the virtual visit: patient/Nurse Health Advisor   I discussed the limitations, risks, security and privacy concerns of performing an evaluation and management service by telephone and the availability of in person appointments. The patient expressed understanding and agreed to proceed.  Interactive audio and video telecommunications were attempted between this nurse and patient, however failed, due to patient having technical difficulties OR patient did not have access to video capability.  We continued and completed visit with audio only.  Some vital signs may be absent or patient reported.   Jamie Sobol E Cabell Lazenby, LPN   Review of Systems     Cardiac Risk Factors include: advanced age (>6men, >44 women);dyslipidemia;hypertension;sedentary lifestyle     Objective:    Today's Vitals   01/15/21 1405  Weight: 170 lb (77.1 kg)  Height: 5\' 7"  (1.702 m)   Body mass index is 26.63 kg/m.  Advanced Directives 01/15/2021 12/26/2019 12/20/2018 01/23/2018 01/22/2018 10/30/2017 10/22/2017  Does Patient Have a Medical Advance Directive? Yes No No No No No No  Type of Paramedic of [place];Living will - - - - - -  Does patient want to make changes to medical advance directive? - - - - - - -  Copy of [place] in Chart? No - copy requested - - - - - -  Would patient like information on creating a medical advance directive? - No - Patient declined No - Patient declined - No - Patient declined No - Patient declined No - Patient declined  Pre-existing out of facility DNR order (yellow form or pink  MOST form) - - - - - - -    Current Medications (verified) Outpatient Encounter Medications as of 01/15/2021  Medication Sig   donepezil (ARICEPT) 10 MG tablet Take 1 tablet (10 mg total) by mouth at bedtime. (NEEDS TO BE SEEN BEFORE NEXT REFILL)   Multiple Vitamin (MULTIVITAMIN) tablet Take 1 tablet by mouth daily.   rosuvastatin (CRESTOR) 10 MG tablet Take 1 tablet (10 mg total) by mouth at bedtime. (NEEDS TO BE SEEN BEFORE NEXT REFILL)   No facility-administered encounter medications on file as of 01/15/2021.    Allergies (verified) Fosamax [alendronate sodium] and Sulfonamide derivatives   History: Past Medical History:  Diagnosis Date   Allergy    Ambulates with cane    4 prong straight cane   Ataxia    uses cane as ambulation   Brain cancer ([place])    Cancer ([place])    Cataract    removed by surgery   CKD (chronic kidney disease), stage III ([place])    Dementia ([place])    Diabetic retinopathy ([place])    NPDR OU - patient denies this dx   High cholesterol    Hypertensive retinopathy    OU   Insomnia    Lung cancer ([place])    Osteoporosis    Walker as ambulation aid    Past Surgical History:  Procedure Laterality Date   ABDOMINAL HYSTERECTOMY  1986   BRAIN SURGERY  2001   TUMOR REMOVAL   catarac Bilateral    CATARACT EXTRACTION Bilateral  COLONOSCOPY  07/2013   hx polyps   EYE SURGERY     LOBECTOMY     Family History  Problem Relation Age of Onset   Cancer Mother        cirrhois of liver   Heart attack Father    Colon cancer Father    Asthma Brother    Rectal cancer Neg Hx    Stomach cancer Neg Hx    Social History   Socioeconomic History   Marital status: Divorced    Spouse name: Not on file   Number of children: 2   Years of education: 9   Highest education level: 9th grade  Occupational History   Occupation: retired  Tobacco Use   Smoking status: Former    Years: 33.00    Types: Cigarettes    Quit date: 04/20/1999    Years since quitting: 21.7    Smokeless tobacco: Never  Vaping Use   Vaping Use: Never used  Substance and Sexual Activity   Alcohol use: Not Currently   Drug use: No   Sexual activity: Not Currently  Other Topics Concern   Not on file  Social History Narrative   She lives with her daughter in Sports coach and son (her son works out of town a lot)   Social Determinants of Radio broadcast assistant Strain: Low Risk    Difficulty of Paying Living Expenses: Not hard at all  Food Insecurity: No Food Insecurity   Worried About Charity fundraiser in the Last Year: Never true   Arboriculturist in the Last Year: Never true  Transportation Needs: No Transportation Needs   Lack of Transportation (Medical): No   Lack of Transportation (Non-Medical): No  Physical Activity: Inactive   Days of Exercise per Week: 0 days   Minutes of Exercise per Session: 0 min  Stress: Stress Concern Present   Feeling of Stress : To some extent  Social Connections: Socially Isolated   Frequency of Communication with Friends and Family: More than three times a week   Frequency of Social Gatherings with Friends and Family: More than three times a week   Attends Religious Services: Never   Marine scientist or Organizations: No   Attends Music therapist: Never   Marital Status: Divorced    Tobacco Counseling Counseling given: Not Answered   Clinical Intake:  Pre-visit preparation completed: Yes  Pain : No/denies pain     BMI - recorded: 26.63 Nutritional Status: BMI 25 -29 Overweight Nutritional Risks: None  How often do you need to have someone help you when you read instructions, pamphlets, or other written materials from your doctor or pharmacy?: 1 - Never  Diabetic? No  Interpreter Needed?: No  Information entered by :: Micheline Markes, LPN   Activities of Daily Living In your present state of health, do you have any difficulty performing the following activities: 01/15/2021  Hearing? N  Vision? N   Difficulty concentrating or making decisions? Y  Walking or climbing stairs? Y  Dressing or bathing? Y  Doing errands, shopping? Y  Preparing Food and eating ? Y  Using the Toilet? Y  In the past six months, have you accidently leaked urine? Y  Do you have problems with loss of bowel control? Y  Managing your Medications? Y  Managing your Finances? Y  Housekeeping or managing your Housekeeping? Y  Some recent data might be hidden    Patient Care Team: Janora Norlander,  DO as PCP - General (Family Medicine) Gatha Mayer, MD as Consulting Physician (Gastroenterology) Lavonna Monarch, MD as Consulting Physician (Dermatology) Katha Cabal, LCSW as Social Worker (Licensed Clinical Social Worker) Ilean China, RN as Case Manager Ilean China, RN as [place] any recent [place] you may have received from other than [facility] providers in the past year (date may be approximate).     Assessment:   This is a routine wellness examination for [place].  Hearing/Vision screen Hearing Screening - Comments:: Denies hearing difficulties  Vision Screening - Comments:: Denies vision difficulties . No eye doctor  Dietary issues and exercise activities discussed: Current Exercise Habits: The patient does not participate in regular exercise at present, Exercise limited by: orthopedic condition(s);psychological condition(s)   Goals Addressed             This Visit's Progress    DIET - INCREASE WATER INTAKE   On track    Try to drink 6-8 glasses of water daily.     Exercise 3x per week (30 min per time)   Not on track    Walk for 30 minutes 3 times per week       Depression Screen PHQ 2/9 Scores 01/15/2021 11/21/2020 08/29/2020 08/29/2020 10/05/2019 12/20/2018 07/17/2018  PHQ - 2 Score 2 2 2 2  0 - 2  PHQ- 9 Score 6 6 6 6  0 - 4  Exception Documentation - - - - - Medical reason -    Fall Risk Fall Risk  01/15/2021 06/10/2020 10/05/2019  12/20/2018 06/05/2018  Falls in the past year? 1 1 1 1 1   Comment - - - - -  Number falls in past yr: 1 1 1 1 1   Injury with Fall? 1 0 0 0 1  Risk Factor Category  - - - - -  Risk for fall due to : History of fall(s);Impaired balance/gait;Mental status change;Orthopedic patient History of fall(s);Impaired mobility Impaired balance/gait;History of fall(s) History of fall(s);Impaired balance/gait;Impaired vision;Mental status change -  Risk for fall due to: Comment - - - - -  Follow up Education provided;Falls prevention discussed Falls prevention discussed Education provided Falls prevention discussed -  Comment - - - get rid of throw rugs in the house, adequate lighting in walkways and grab bars in bathroom -    [place]:  Any stairs in or around the home? Yes  If so, are there any without handrails? No  Home free of loose throw rugs in walkways, pet beds, electrical cords, etc? Yes  Adequate lighting in your home to reduce risk of falls? Yes   ASSISTIVE DEVICES UTILIZED TO PREVENT FALLS:  Life alert? No  Use of a cane, walker or w/c? Yes  Grab bars in the bathroom? Yes  Shower chair or bench in shower? Yes  Elevated toilet seat or a handicapped toilet? Yes   TIMED UP AND GO:  Was the test performed? No . Telephonic visit  Cognitive Function: Cognitive status assessed by direct observation. Patient has current diagnosis of cognitive impairment. Patient is unable to complete screening 6CIT or MMSE.   MMSE - Mini Mental State Exam 06/10/2020 10/05/2019 12/20/2018 09/20/2014  Not completed: - - Unable to complete -  Orientation to time 0 4 - 5  Orientation to Place 4 4 - 4  Registration 3 3 - 3  Attention/ Calculation 2 3 - 0  Attention/Calculation-comments - - - refused  Recall 3  0 - 2  Language- name 2 objects 2 2 - 2  Language- repeat 1 1 - 1  Language- follow 3 step command 2 2 - 3  Language- read & follow direction 1 1 - 1  Write a sentence 0 1  - 0  Write a sentence-comments - - - refused  Copy design 0 1 - 0  Total score 18 22 - 21        Immunizations Immunization History  Administered Date(s) Administered   Hpv-Unspecified 11/16/2006   Tdap 09/13/2012   Zoster, Live 12/05/2012    TDAP status: Up to date  Flu Vaccine status: Due, Education has been provided regarding the importance of this vaccine. Advised may receive this vaccine at local pharmacy or Health Dept. Aware to provide a copy of the vaccination record if obtained from local pharmacy or Health Dept. Verbalized acceptance and understanding.  Pneumococcal vaccine status: Due, Education has been provided regarding the importance of this vaccine. Advised may receive this vaccine at local pharmacy or Health Dept. Aware to provide a copy of the vaccination record if obtained from local pharmacy or Health Dept. Verbalized acceptance and understanding.  Covid-19 vaccine status: Declined, Education has been provided regarding the importance of this vaccine but patient still declined. Advised may receive this vaccine at local pharmacy or Health Dept.or vaccine clinic. Aware to provide a copy of the vaccination record if obtained from local pharmacy or Health Dept. Verbalized acceptance and understanding.  Qualifies for Shingles Vaccine? Yes   Zostavax completed Yes   Shingrix Completed?: No.    Education has been provided regarding the importance of this vaccine. Patient has been advised to call insurance company to determine out of pocket expense if they have not yet received this vaccine. Advised may also receive vaccine at local pharmacy or Health Dept. Verbalized acceptance and understanding.  Screening Tests Health Maintenance  Topic Date Due   COVID-19 Vaccine (1) Never done   URINE MICROALBUMIN  Never done   Hepatitis C Screening  Never done   Zoster Vaccines- Shingrix (1 of 2) Never done   MAMMOGRAM  12/15/2017   COLONOSCOPY (Pts 45-98yrs Insurance coverage  will need to be confirmed)  07/19/2018   INFLUENZA VACCINE  Never done   TETANUS/TDAP  09/14/2022   DEXA SCAN  Completed   HPV VACCINES  Aged Out    Health Maintenance  Health Maintenance Due  Topic Date Due   COVID-19 Vaccine (1) Never done   URINE MICROALBUMIN  Never done   Hepatitis C Screening  Never done   Zoster Vaccines- Shingrix (1 of 2) Never done   MAMMOGRAM  12/15/2017   COLONOSCOPY (Pts 45-12yrs Insurance coverage will need to be confirmed)  07/19/2018   INFLUENZA VACCINE  Never done    Colorectal cancer screening: declined - patient has dementia  Mammogram status: No longer required due to dementia patient.  Bone Density status: Completed 09/20/2014. Results reflect: Bone density results: OSTEOPOROSIS. Repeat every 2 years.  Lung Cancer Screening: (Low Dose CT Chest recommended if Age 37-80 years, 30 pack-year currently smoking OR have quit w/in 15years.) does not qualify  Additional Screening:  Hepatitis C Screening: does qualify; due  Vision Screening: Recommended annual ophthalmology exams for early detection of glaucoma and other disorders of the eye. Is the patient up to date with their annual eye exam?  No  Who is the provider or what is the name of the office in which the patient attends annual eye exams? none  If pt is not established with a provider, would they like to be referred to a provider to establish care? No .   Dental Screening: Recommended annual dental exams for proper oral hygiene  Community Resource Referral / Chronic Care Management: CRR required this visit?  Yes   CCM required this visit?  Yes      Plan:     I have personally reviewed and noted the following in the patient's chart:   Medical and social history Use of alcohol, tobacco or illicit drugs  Current medications and supplements including opioid prescriptions.  Functional ability and status Nutritional status Physical activity Advanced directives List of other  physicians Hospitalizations, surgeries, and ER visits in previous 12 months Vitals Screenings to include cognitive, depression, and falls Referrals and appointments  In addition, I have reviewed and discussed with patient certain preventive protocols, quality metrics, and best practice recommendations. A written personalized care plan for preventive services as well as general preventive health recommendations were provided to patient.     Sandrea Hammond, LPN   11/26/1749   Nurse Notes: Dementia worsening - her daughter in law seems very stressed and doesn't seem to understand Dementia well - sent CCM referral to see if social worker can evaluate her situation and possibly provide resources that can help.

## 2021-01-21 ENCOUNTER — Ambulatory Visit: Payer: Medicare Other | Admitting: Family Medicine

## 2021-01-21 ENCOUNTER — Telehealth (INDEPENDENT_AMBULATORY_CARE_PROVIDER_SITE_OTHER): Payer: Medicare Other | Admitting: Family Medicine

## 2021-01-21 ENCOUNTER — Encounter: Payer: Self-pay | Admitting: Family Medicine

## 2021-01-21 DIAGNOSIS — M7918 Myalgia, other site: Secondary | ICD-10-CM

## 2021-01-21 DIAGNOSIS — F339 Major depressive disorder, recurrent, unspecified: Secondary | ICD-10-CM

## 2021-01-21 DIAGNOSIS — E7800 Pure hypercholesterolemia, unspecified: Secondary | ICD-10-CM | POA: Diagnosis not present

## 2021-01-21 DIAGNOSIS — F01B11 Vascular dementia, moderate, with agitation: Secondary | ICD-10-CM

## 2021-01-21 MED ORDER — DONEPEZIL HCL 10 MG PO TABS: 10.0000 mg | ORAL_TABLET | Freq: Every day | ORAL | 1 refills | Status: AC

## 2021-01-21 MED ORDER — DULOXETINE HCL 30 MG PO CPEP: 30.0000 mg | ORAL_CAPSULE | Freq: Every day | ORAL | 0 refills | Status: AC

## 2021-01-21 MED ORDER — ROSUVASTATIN CALCIUM 10 MG PO TABS: 10.0000 mg | ORAL_TABLET | Freq: Every day | ORAL | 1 refills | Status: AC

## 2021-01-21 NOTE — Progress Notes (Signed)
MyChart Telephone visit  Subjective: ME:[id number] PCP: Janora Norlander, DO [id number] Jamie [name] is a 70 y.o. female. Patient provides verbal consent for consult held via phone.  Due to COVID-19 pandemic this visit was conducted virtually. This visit type was conducted due to national recommendations for restrictions regarding the COVID-19 Pandemic (e.g. social distancing, sheltering in place) in an effort to limit this patient's exposure and mitigate transmission in our community. All issues noted in this document were discussed and addressed.  A physical exam was not performed with this format.   Location of patient: [place] with daughter in law Location of provider: [facility] Others present for call: daughter in law Brandy Sizemore  1. Dementia Still having difficulty with ADLs.  Had [place] coming in but it was not helpful.  She continues to have hallucinations and they seem to be getting worse.  The patient has mood swings and becomes verbally abrasive at times.  No physical outbursts.  She is paranoid at times and accuses her family of trying to take her money.  Her daughter-in-law feels that she is more depressed.  She complains of musculoskeletal pain often despite use of Tylenol and ibuprofen.  She admits that she does not move around much.  ROS: Per HPI  Allergies  Allergen Reactions   Fosamax [Alendronate Sodium] Other (See Comments)    "unknown"   Sulfonamide Derivatives     REACTION: Hives, tongue swells   Past Medical History:  Diagnosis Date   Allergy    Ambulates with cane    4 prong straight cane   Ataxia    uses cane as ambulation   Brain cancer ([place])    Cancer ([place])    Cataract    removed by surgery   CKD (chronic kidney disease), stage III ([place])    Dementia ([place])    Diabetic retinopathy ([place])    NPDR OU - patient denies this dx   High cholesterol    Hypertensive retinopathy    OU   Insomnia    Lung cancer ([place])    Osteoporosis    Walker as  ambulation aid     Current Outpatient Medications:    donepezil (ARICEPT) 10 MG tablet, Take 1 tablet (10 mg total) by mouth at bedtime. (NEEDS TO BE SEEN BEFORE NEXT REFILL), Disp: 30 tablet, Rfl: 0   Multiple Vitamin (MULTIVITAMIN) tablet, Take 1 tablet by mouth daily., Disp: , Rfl:    rosuvastatin (CRESTOR) 10 MG tablet, Take 1 tablet (10 mg total) by mouth at bedtime. (NEEDS TO BE SEEN BEFORE NEXT REFILL), Disp: 30 tablet, Rfl: 0  Assessment/ Plan: 69 y.o. female   Moderate vascular dementia with agitation - Plan: donepezil (ARICEPT) 10 MG tablet, Ambulatory referral to Geriatrics, Ambulatory referral to Neurology  Pure hypercholesterolemia - Plan: rosuvastatin (CRESTOR) 10 MG tablet, Ambulatory referral to Geriatrics  Musculoskeletal pain - Plan: DULoxetine (CYMBALTA) 30 MG capsule  Depression, recurrent ([place]) - Plan: DULoxetine (CYMBALTA) 30 MG capsule  Dementia seems to be progressive.  Patient is having increased hallucinations.  Discussed consideration for atypical antipsychotic but we also discussed the cardiovascular risk with this.  I have referred her to both geriatrics and neurology in [place].  I reinforced need to establish care ASAP as she has had a lapse in care for the last 8 months.  Medications have been renewed in the interim  Start Cymbalta in efforts to improve depression and musculoskeletal pain.  We will see if we can arrange personal care services since she has  supplemental Medicaid  Start time: 4:55pm End time: 5:21pm  Total time spent on patient care (including video visit/ documentation): 24 minutes  [place], [place] [phone]

## 2021-01-23 ENCOUNTER — Ambulatory Visit (INDEPENDENT_AMBULATORY_CARE_PROVIDER_SITE_OTHER): Payer: Medicare Other | Admitting: Licensed Clinical Social Worker

## 2021-01-23 DIAGNOSIS — Z85841 Personal history of malignant neoplasm of brain: Secondary | ICD-10-CM

## 2021-01-23 DIAGNOSIS — E785 Hyperlipidemia, unspecified: Secondary | ICD-10-CM

## 2021-01-23 DIAGNOSIS — Z9181 History of falling: Secondary | ICD-10-CM

## 2021-01-23 DIAGNOSIS — N1831 Chronic kidney disease, stage 3a: Secondary | ICD-10-CM

## 2021-01-23 DIAGNOSIS — Z85118 Personal history of other malignant neoplasm of bronchus and lung: Secondary | ICD-10-CM

## 2021-01-23 DIAGNOSIS — M810 Age-related osteoporosis without current pathological fracture: Secondary | ICD-10-CM

## 2021-01-23 DIAGNOSIS — R413 Other amnesia: Secondary | ICD-10-CM

## 2021-01-23 NOTE — Chronic Care Management (AMB) (Signed)
Chronic Care Management    Clinical Social Work Note  01/23/2021 Name: Jamie [name] MRN: [id number] DOB: 13-Jun-1950  Jamie [name] is a 70 y.o. year old female who is a primary care patient of Janora Norlander, DO. The CCM team was consulted to assist the patient with chronic disease management and/or care coordination needs related to: [employer] .   Engaged with patient /daughter in Sports coach of patient, Ramond Craver, by telephone for follow up visit in response to provider referral for social work chronic care management and care coordination services.   Consent to Services:  The patient was given information about Chronic Care Management services, agreed to services, and gave verbal consent prior to initiation of services.  Please see initial visit note for detailed documentation.   Patient agreed to services and consent obtained.   Assessment: Review of patient past medical history, allergies, medications, and health status, including review of relevant consultants reports was performed today as part of a comprehensive evaluation and provision of chronic care management and care coordination services.     SDOH (Social Determinants of Health) assessments and interventions performed:  SDOH Interventions    Flowsheet Row Most Recent Value  SDOH Interventions   Physical Activity Interventions Other (Comments)  [client has walking challenges, client uses a walker to ambulate]  Stress Interventions Provide Counseling  [client has stress related to current placement. client has stress related to memory issues (client can also be irritable and have paranoia symptoms)]  Depression Interventions/Treatment  Patient refuses Treatment        Advanced Directives Status: See Vynca application for related entries.  CCM Care Plan  Allergies  Allergen Reactions   Fosamax [Alendronate Sodium] Other (See Comments)    "unknown"   Sulfonamide Derivatives     REACTION: Hives, tongue  swells    Outpatient Encounter Medications as of 01/23/2021  Medication Sig   donepezil (ARICEPT) 10 MG tablet Take 1 tablet (10 mg total) by mouth at bedtime. Please est care with new doctor   DULoxetine (CYMBALTA) 30 MG capsule Take 1 capsule (30 mg total) by mouth daily.   Multiple Vitamin (MULTIVITAMIN) tablet Take 1 tablet by mouth daily.   rosuvastatin (CRESTOR) 10 MG tablet Take 1 tablet (10 mg total) by mouth at bedtime.   No facility-administered encounter medications on file as of 01/23/2021.    Patient Active Problem List   Diagnosis Date Noted   Hypocalcemia 12/26/2019   Leukopenia 12/26/2019   Dementia without behavioral disturbance ([place]) 12/26/2019   Seasonal allergic rhinitis due to pollen 09/06/2018   Folate deficiency 09/06/2018   CKD (chronic kidney disease), stage III 10/31/2017   Altered mental status 10/30/2017   Closed nondisplaced comminuted fracture of shaft of right humerus 07/25/2017   Lumbar radiculopathy 06/21/2017   Multiple falls 06/21/2017   Ataxia 06/21/2017   Claustrophobia 06/21/2017   Imbalance 06/21/2017   Memory loss 02/16/2017   History of therapeutic radiation 08/03/2016   Osteoporosis 10/09/2014   At high risk for falls 10/09/2014   Hx of cancer of lung 08/01/2014   Hx of brain cancer 08/01/2014   Personal history of colonic polyps - adenomas 07/18/2013   Memory impairment 03/22/2011   History of cancer 03/22/2011   Hyperlipidemia 03/22/2011    Conditions to be addressed/monitored: monitor client's in home care needs and client completion of ADLs  Care Plan : LCSW care plan  Updates made by Katha Cabal, LCSW since 01/23/2021 12:00 AM  Problem: Coping Skills (General Plan of Care)      Goal: Coping Skills Enhanced: Manage memory issues ; complete ADLs daily as able   Start Date: 01/23/2021  Expected End Date: 04/22/2021  This Visit's Progress: Not on track  Recent Progress: On track  Priority: High  Note:   Current  barriers:   Patient in need of assistance with connecting to community resources for possible help with managing in home care needs of client Patient is unable to independently navigate community resource options without care coordination support Dementia Some mobility issues Some pain issues  Clinical Goals:  patient will work with SW in next 30 days to address concerns related to memory challenges of client Patient will work with SW in next 30 days to address concerns related to client completion of ADLs and other daily activities Client will attend scheduled medical appointments in next 30 days  Clinical Interventions:  Collaboration with Janora Norlander, DO regarding development and update of comprehensive plan of care as evidenced by provider attestation and co-signature LCSW called [facility] in [place], [place] today and spoke with representative about PCS qualification requirements LCSW called [place] today and spoke with representative about Medicaid eligibility for client Reviewed with Ramond Craver, daughter in law of client, about client needs Reviewed with Theadora Rama that client continues to reside with [name] and Lester at residence in [place] .  Discussed with Brandy pain issues of client.  Discussed with Brandy ambulation challenges of client. Theadora Rama said client often rests in bed and wants family to wait on her. Theadora Rama said client can walk with use of her walker.  Theadora Rama said client has become more dependent recently with ADLs completion.  Discussed PCS services with Brandy.  Informed Theadora Rama that client or her family would need to go to [facility] in [place], [place] to apply for [facility] for client.  If client were approved for Medicaid, then family could talk with medical provider about requesting PCS services for client. Discussed with Theadora Rama that Dr. Lajuana Ripple had made referral for [facility] for geriatric medical provider and for a neurologist Per Dr.  Marjean Donna recommendation, LCSW also recommended that [facility] establish care as soon as possible with medical provider in that area. Paulla Dolly about client mood, including discussion of client irritability, anger episodes , hallucinations, and paranoia symptoms ) Provided counseling support for [facility] regarding client needs  Client Coping Skills:  Takes medications as prescribed Attends scheduled medical appointments Has family support from her son and grandchildren  Client deficits: Memory issues Dementia Some mobility issues  Patient Goals:  In next 30 days, client will attend all scheduled medical appointments In next 30 days client will call RNCM or LCSW as needed for CCM support In next 30 days client will allow time for rest and relaxation for client -  Follow Up Plan: LCSW to call client , son of client, or daughter in law of client on 03/18/21 at 10:00 AM to assess needs of client      Norva Riffle.Alaja Goldinger MSW, LCSW Licensed Clinical Social Worker [facility] Care Management [phone]

## 2021-01-23 NOTE — Patient Instructions (Signed)
Visit Information  PATIENT GOALS:  Goals Addressed             This Visit's Progress    Protect My Health;Manage memory issues; Complete ADLs daily as able       Timeframe:  Short-Term Goal Priority:  High Progress: Not On Track Start Date:            01/23/21                Expected End Date:          04/22/21            Follow Up Date  03/18/21 at 10:00 AM   Protect My Health (Patient) Manage memory issues; Complete ADLs daily as able    Why is this important?   Screening tests can find diseases early when they are easier to treat.  Your doctor or nurse will talk with you about which tests are important for you.  Getting shots for common diseases like the flu and shingles will help prevent them.     Client Coping Skills:  Takes medications as prescribed Attends scheduled medical appointments Has family support from her son and grandchildren  Client deficits: Memory issues Dementia Some mobility issues  Patient Goals:  In next 30 days, client will attend all scheduled medical appointments In next 30 days client will call RNCM or LCSW as needed for CCM support In next 30 days client will allow time for rest and relaxation for client -  Follow Up Plan: LCSW to call client , son of client, or daughter in law of client on 03/18/21 at 10:00 AM to assess client needs     Norva Riffle.Zaylan Kissoon MSW, LCSW Licensed Clinical Social Worker [facility] Care Management [phone]

## 2021-02-12 ENCOUNTER — Telehealth: Payer: Medicare Other

## 2021-02-16 DIAGNOSIS — M810 Age-related osteoporosis without current pathological fracture: Secondary | ICD-10-CM

## 2021-02-16 DIAGNOSIS — Z85118 Personal history of other malignant neoplasm of bronchus and lung: Secondary | ICD-10-CM

## 2021-02-16 DIAGNOSIS — E785 Hyperlipidemia, unspecified: Secondary | ICD-10-CM

## 2021-02-16 DIAGNOSIS — N1831 Chronic kidney disease, stage 3a: Secondary | ICD-10-CM | POA: Diagnosis not present

## 2021-03-10 ENCOUNTER — Telehealth: Payer: Self-pay | Admitting: Family Medicine

## 2021-03-18 ENCOUNTER — Ambulatory Visit (INDEPENDENT_AMBULATORY_CARE_PROVIDER_SITE_OTHER): Payer: Medicare Other | Admitting: Licensed Clinical Social Worker

## 2021-03-18 DIAGNOSIS — Z9181 History of falling: Secondary | ICD-10-CM

## 2021-03-18 DIAGNOSIS — N1831 Chronic kidney disease, stage 3a: Secondary | ICD-10-CM

## 2021-03-18 DIAGNOSIS — Z85841 Personal history of malignant neoplasm of brain: Secondary | ICD-10-CM

## 2021-03-18 DIAGNOSIS — M810 Age-related osteoporosis without current pathological fracture: Secondary | ICD-10-CM

## 2021-03-18 DIAGNOSIS — Z85118 Personal history of other malignant neoplasm of bronchus and lung: Secondary | ICD-10-CM

## 2021-03-18 DIAGNOSIS — E785 Hyperlipidemia, unspecified: Secondary | ICD-10-CM

## 2021-03-18 DIAGNOSIS — R413 Other amnesia: Secondary | ICD-10-CM

## 2021-03-18 NOTE — Chronic Care Management (AMB) (Signed)
Chronic Care Management    Clinical Social Work Note  03/18/2021 Name: Jamie [name] MRN: [id number] DOB: 01-03-1951  Jamie [name] is a 70 y.o. year old female who is a primary care patient of Janora Norlander, DO. The CCM team was consulted to assist the patient with chronic disease management and/or care coordination needs related to: [employer] .   Engaged with patient / Urvi Imes, son of client , and with Ramond Craver, daughter in law of client, by telephone for follow up visit in response to provider referral for social work chronic care management and care coordination services.   Consent to Services:  The patient was given information about Chronic Care Management services, agreed to services, and gave verbal consent prior to initiation of services.  Please see initial visit note for detailed documentation.   Patient agreed to services and consent obtained.   Assessment: Review of patient past medical history, allergies, medications, and health status, including review of relevant consultants reports was performed today as part of a comprehensive evaluation and provision of chronic care management and care coordination services.     SDOH (Social Determinants of Health) assessments and interventions performed:  SDOH Interventions    Flowsheet Row Most Recent Value  SDOH Interventions   Physical Activity Interventions Other (Comments)  [walking challenges. uses a rollator walker to help her walk]  Stress Interventions Other (Comment)  [client has stress related to memory issues and related to completing daily ADLs. client has stress related to managing medical needs]  Depression Interventions/Treatment  Patient refuses Treatment        Advanced Directives Status: See Vynca application for related entries.  CCM Care Plan  Allergies  Allergen Reactions   Fosamax [Alendronate Sodium] Other (See Comments)    "unknown"   Sulfonamide Derivatives      REACTION: Hives, tongue swells    Outpatient Encounter Medications as of 03/18/2021  Medication Sig   donepezil (ARICEPT) 10 MG tablet Take 1 tablet (10 mg total) by mouth at bedtime. Please est care with new doctor   DULoxetine (CYMBALTA) 30 MG capsule Take 1 capsule (30 mg total) by mouth daily.   Multiple Vitamin (MULTIVITAMIN) tablet Take 1 tablet by mouth daily.   rosuvastatin (CRESTOR) 10 MG tablet Take 1 tablet (10 mg total) by mouth at bedtime.   No facility-administered encounter medications on file as of 03/18/2021.    Patient Active Problem List   Diagnosis Date Noted   Hypocalcemia 12/26/2019   Leukopenia 12/26/2019   Dementia without behavioral disturbance ([place]) 12/26/2019   Seasonal allergic rhinitis due to pollen 09/06/2018   Folate deficiency 09/06/2018   CKD (chronic kidney disease), stage III 10/31/2017   Altered mental status 10/30/2017   Closed nondisplaced comminuted fracture of shaft of right humerus 07/25/2017   Lumbar radiculopathy 06/21/2017   Multiple falls 06/21/2017   Ataxia 06/21/2017   Claustrophobia 06/21/2017   Imbalance 06/21/2017   Memory loss 02/16/2017   History of therapeutic radiation 08/03/2016   Osteoporosis 10/09/2014   At high risk for falls 10/09/2014   Hx of cancer of lung 08/01/2014   Hx of brain cancer 08/01/2014   Personal history of colonic polyps - adenomas 07/18/2013   Memory impairment 03/22/2011   History of cancer 03/22/2011   Hyperlipidemia 03/22/2011    Conditions to be addressed/monitored: monitor client completion of daily ADLs; monitor client memory issues  Care Plan : LCSW care plan  Updates made by Katha Cabal, LCSW since 03/18/2021  12:00 AM     Problem: Coping Skills (General Plan of Care)      Goal: Coping Skills Enhanced: Manage memory issues ; complete ADLs daily as able   Start Date: 03/18/2021  Expected End Date: 06/15/2021  This Visit's Progress: On track  Recent Progress: Not on track   Priority: High  Note:   Current barriers:   Patient in need of assistance with connecting to community resources for possible help with managing in home care needs of client Patient is unable to independently navigate community resource options without care coordination support Dementia Some mobility issues Some pain issues Memory issues  Clinical Goals:  patient will work with SW in next 30 days to address concerns related to memory challenges of client Patient will work with SW in next 30 days to address concerns related to client completion of ADLs and other daily activities Client will attend scheduled medical appointments in next 30 days  Clinical Interventions:  Collaboration with Janora Norlander, DO regarding development and update of comprehensive plan of care as evidenced by provider attestation and co-signature Discussed client needs with Heron Sabins, son of client Discussed client needs with [name], daughter in law of client Reviewed with [name] the pain issues of client. Client has occasional knee pain issues. Discussed with [name] ambulation challenges of client.[name] said that client  uses a rollator walker to ambulate.  Discussed with Theadora Rama the daily activities of client. Theadora Rama said client has been more active recently, coming out of her room more, sitting on porch to look outdoors [place] said client has been eating well. Discussed Medicaid application process with Brandy. Discussed PCS services application process with Brandy.  Theadora Rama said she had spoken via phone with DSS representative and that Theadora Rama learned that client probably has too much money to qualify for Medicaid or PCS services at this time.  LCSW talked with Theadora Rama about in home care support for client Discussed with Theadora Rama that Dr. Lajuana Ripple had made referral for [facility] for geriatric medical provider and for a neurologist. Theadora Rama said [place] son, and she Theadora Rama) were waiting on call  from Geriatric MD or from Neurologist to schedule appointment for client Discussed with Theadora Rama the importance of keeping in communication at this time with [facility] while client is trying to establish care in current [place],  Reviewed client mood stability.  Theadora Rama said client has periodic hallucinations, thinking she hears a little girl calling her name or hears a little girl speaking to her [place]) Reviewed with Theadora Rama the importance of keeping a daily routine or schedule with client Encouraged [name] or Theadora Rama to call LCSW or RNCM as needed at [facility] for CCM support for client  Client Coping Skills:  Takes medications as prescribed Attends scheduled medical appointments Has family support from her son and grandchildren  Client deficits: Memory issues Dementia Some mobility issues  Patient Goals:  In next 30 days, client will attend all scheduled medical appointments In next 30 days client will call RNCM or LCSW as needed for CCM support In next 30 days client will allow time for rest and relaxation for client -  Follow Up Plan: LCSW to call client , son of client, or daughter in law of client on 05/15/21 at 3:00 PM to assess needs of client    Norva Riffle.Ulanda Tackett MSW, LCSW Licensed Clinical Social Worker [facility] Care Management [phone]

## 2021-03-18 NOTE — Patient Instructions (Addendum)
Visit Information  Patient Goals: Protect My Health (Patient). Manage memory issues. Complete ADLs daily as able  Timeframe:  Short-Term Goal Priority:  High Progress:  On Track Start Date:   03/18/21                Expected End Date:   06/15/21            Follow Up Date  05/15/21 at 3:00 PM   Protect My Health (Patient) Manage memory issues; Complete ADLs daily as able    Why is this important?   Screening tests can find diseases early when they are easier to treat.  Your doctor or nurse will talk with you about which tests are important for you.  Getting shots for common diseases like the flu and shingles will help prevent them.     Client Coping Skills:  Takes medications as prescribed Attends scheduled medical appointments Has family support from her son and grandchildren  Client deficits: Memory issues Dementia Some mobility issues  Patient Goals:  In next 30 days, client will attend all scheduled medical appointments In next 30 days client will call RNCM or LCSW as needed for CCM support In next 30 days client will allow time for rest and relaxation for client -  Follow Up Plan: LCSW to call client , son of client, or daughter in law of client on 05/15/21 at 3:00 PM to assess client needs   Norva Riffle.Lowery Paullin MSW, LCSW Licensed Clinical Social Worker [facility] Care Management [phone]

## 2021-03-24 ENCOUNTER — Telehealth: Payer: Self-pay | Admitting: Family Medicine

## 2021-03-24 NOTE — Telephone Encounter (Signed)
Attempted to reach Jamie [name]'s children at the numbers listed.  The cell number was picked up but then it disconnected.  I received a notice from [facility] stating that they have been unsuccessful in reaching patient to schedule appt.  Please attempt to let the family know that they need to call [phone] to schedule her appt.

## 2021-03-26 DIAGNOSIS — R509 Fever, unspecified: Secondary | ICD-10-CM | POA: Insufficient documentation

## 2021-03-30 ENCOUNTER — Telehealth: Payer: Self-pay

## 2021-03-30 DIAGNOSIS — R6510 Systemic inflammatory response syndrome (SIRS) of non-infectious origin without acute organ dysfunction: Secondary | ICD-10-CM | POA: Insufficient documentation

## 2021-03-30 NOTE — Telephone Encounter (Signed)
Transition Care Management Follow-up Telephone Call Date of discharge and from where: 03/29/21 - [facility] in [place], [place] - Altered Mental Status How have you been since you were released from the hospital? She seems to be doing better Any questions or concerns? No  Items Reviewed: Did the pt receive and understand the discharge instructions provided? Yes  Medications obtained and verified? Yes  Other? No  Any new allergies since your discharge? No  Dietary orders reviewed? Yes Do you have support at home? Yes   Home Care and Equipment/Supplies: Were home health services ordered? yes If so, what is the name of the agency? [facility] Has the agency set up a time to come to the patient's home? yes Were any new equipment or medical supplies ordered?  No What is the name of the medical supply agency? N/a Were you able to get the supplies/equipment? not applicable Do you have any questions related to the use of the equipment or supplies? No  Functional Questionnaire: (I = Independent and D = Dependent) ADLs: D - needs help with most things  Bathing/Dressing- I - with assistance  Meal Prep- D  Eating- I  Maintaining continence- D - sometimes makes it, other times she goes in her depends  Transferring/Ambulation- I  Managing Meds- D  Follow up appointments reviewed:  PCP Hospital f/u appt confirmed? Yes  Scheduled to see Darla Lesches as [facility] isn't available on 04/03/21 @ 11:15. [facility] f/u appt confirmed? No   Are transportation arrangements needed? No  If their condition worsens, is the pt aware to call PCP or go to the Emergency Dept.? Yes Was the patient provided with contact information for the PCP's office or ED? Yes Was to pt encouraged to call back with questions or concerns? Yes

## 2021-04-03 ENCOUNTER — Ambulatory Visit (INDEPENDENT_AMBULATORY_CARE_PROVIDER_SITE_OTHER): Payer: Medicare Other | Admitting: Family Medicine

## 2021-04-03 ENCOUNTER — Encounter: Payer: Self-pay | Admitting: Family Medicine

## 2021-04-03 DIAGNOSIS — R6510 Systemic inflammatory response syndrome (SIRS) of non-infectious origin without acute organ dysfunction: Secondary | ICD-10-CM

## 2021-04-03 DIAGNOSIS — R509 Fever, unspecified: Secondary | ICD-10-CM

## 2021-04-03 DIAGNOSIS — Z85841 Personal history of malignant neoplasm of brain: Secondary | ICD-10-CM

## 2021-04-03 DIAGNOSIS — Z09 Encounter for follow-up examination after completed treatment for conditions other than malignant neoplasm: Secondary | ICD-10-CM | POA: Diagnosis not present

## 2021-04-03 DIAGNOSIS — G9341 Metabolic encephalopathy: Secondary | ICD-10-CM

## 2021-04-03 DIAGNOSIS — R4182 Altered mental status, unspecified: Secondary | ICD-10-CM | POA: Diagnosis not present

## 2021-04-03 DIAGNOSIS — Z85118 Personal history of other malignant neoplasm of bronchus and lung: Secondary | ICD-10-CM

## 2021-04-03 DIAGNOSIS — F01B11 Vascular dementia, moderate, with agitation: Secondary | ICD-10-CM

## 2021-04-03 DIAGNOSIS — A419 Sepsis, unspecified organism: Secondary | ICD-10-CM

## 2021-04-03 DIAGNOSIS — R443 Hallucinations, unspecified: Secondary | ICD-10-CM

## 2021-04-03 DIAGNOSIS — N189 Chronic kidney disease, unspecified: Secondary | ICD-10-CM

## 2021-04-03 NOTE — Progress Notes (Signed)
Virtual Visit via telephone note Due to COVID-19 pandemic this visit was conducted virtually. This visit type was conducted due to national recommendations for restrictions regarding the COVID-19 Pandemic (e.g. social distancing, sheltering in place) in an effort to limit this patient's exposure and mitigate transmission in our community. All issues noted in this document were discussed and addressed.  A physical exam was not performed with this format.   I connected with Jamie [name] and her caretaker Jamie [name] on 04/03/2021 at 1156 by telephone and verified that I am speaking with the correct person using two identifiers. Jamie [name] is currently located at son's home and family is currently with them during visit. The provider, Jamie Pouch, FNP is located in their office at time of visit.  I discussed the limitations, risks, security and privacy concerns of performing an evaluation and management service by virtual visit and the availability of in person appointments. I also discussed with the patient that there may be a patient responsible charge related to this service. The patient expressed understanding and agreed to proceed.  Subjective:  Patient ID: Jamie [name], female    DOB: 1950-08-10, 70 y.o.   MRN: [id number]  Chief Complaint:  Hospitalization Follow-up   HPI: Jamie [name] is a 70 y.o. female presenting on 04/03/2021 for Hospitalization Follow-up   Visit today is for transition of care.  On 03/25/2021 patient became more confused, was having visual and auditory hallucinations, had a fever, and generalized weakness so family took her to the emergency room at [facility] [place].  She was discharged on 03/29/2021.  Patient was admitted for fever, metabolic encephalopathy, CKD, dementia, sepsis,SIRS, and history of lung and brain cancer.  Labs during that visit revealed Klebsiella UTI, calcium 8.6, creatinine 0.94, sodium 146, chloride 111, glucose 107.  All other  labs have returned to normal at day of discharge.  Chest x-ray, CTA of chest abdomen and pelvis both negative for acute findings.  MRI brain negative for acute findings.  Lumbar puncture performed and CSF negative.  Blood cultures negative.  She was treated with IV Rocephin x5 doses.  The plan was to have her go to a skilled nursing facility upon discharge but then son decided to take her home and care for her there.  Home health has been involved.  She is getting PT weekly and a home health aide at least twice a week.  Caregiver Jamie [name], states she is doing better since being home.  No recurrent hallucinations.  She is weak and still has some confusion.  Son and Jamie [name] are both wanting patient to have a primary care provider closer to where they are.  She moved in with son in [place], [place], which is more than 3 hours away from [place] [facility].  She was sent home on antibiotic therapy and is taking this as prescribed.  She is taking other medications as prescribed as well.    Relevant past medical, surgical, family, and social history reviewed and updated as indicated.  Allergies and medications reviewed and updated.   Past Medical History:  Diagnosis Date   Acute metabolic encephalopathy    Allergy    Ambulates with cane    4 prong straight cane   Ataxia    uses cane as ambulation   Brain cancer ([place])    Cancer ([place])    Cataract    removed by surgery   CKD (chronic kidney disease), stage III ([place])  Dementia ([place])    Diabetic retinopathy ([place])    NPDR OU - patient denies this dx   High cholesterol    Hypertensive retinopathy    OU   Insomnia    Lung cancer ([place])    Osteoporosis    Pneumonia due to COVID-19 virus 12/26/2019   Subdural hematoma 01/22/2018   Walker as ambulation aid     Past Surgical History:  Procedure Laterality Date   ABDOMINAL HYSTERECTOMY  1986   BRAIN SURGERY  2001   TUMOR REMOVAL   catarac Bilateral    CATARACT EXTRACTION  Bilateral    COLONOSCOPY  07/2013   hx polyps   EYE SURGERY     LOBECTOMY      Social History   Socioeconomic History   Marital status: Divorced    Spouse name: Not on file   Number of children: 2   Years of education: 9   Highest education level: 9th grade  Occupational History   Occupation: retired  Tobacco Use   Smoking status: Former    Years: 33.00    Types: Cigarettes    Quit date: 04/20/1999    Years since quitting: 21.9   Smokeless tobacco: Never  Vaping Use   Vaping Use: Never used  Substance and Sexual Activity   Alcohol use: Not Currently   Drug use: No   Sexual activity: Not Currently  Other Topics Concern   Not on file  Social History Narrative   She lives with her daughter in [place] and son (her son works out of town a lot)   Social Determinants of Radio broadcast assistant Strain: Low Risk    Difficulty of Paying Living Expenses: Not hard at all  Food Insecurity: No Food Insecurity   Worried About Charity fundraiser in the Last Year: Never true   Arboriculturist in the Last Year: Never true  Transportation Needs: No Transportation Needs   Lack of Transportation (Medical): No   Lack of Transportation (Non-Medical): No  Physical Activity: Inactive   Days of Exercise per Week: 0 days   Minutes of Exercise per Session: 0 min  Stress: Stress Concern Present   Feeling of Stress : Rather much  Social Connections: Socially Isolated   Frequency of Communication with Friends and Family: More than three times a week   Frequency of Social Gatherings with Friends and Family: More than three times a week   Attends Religious Services: Never   Marine scientist or Organizations: No   Attends Archivist Meetings: Never   Marital Status: Divorced  Human resources officer Violence: Not At Risk   Fear of Current or Ex-Partner: No   Emotionally Abused: No   Physically Abused: No   Sexually Abused: No    Outpatient Encounter Medications as of 04/03/2021   Medication Sig   acetaminophen (TYLENOL) 500 MG tablet Take by mouth.   cyanocobalamin 1000 MCG tablet Take 1 tablet by mouth daily.   donepezil (ARICEPT) 10 MG tablet Take 1 tablet (10 mg total) by mouth at bedtime. Please est care with new doctor   DULoxetine (CYMBALTA) 30 MG capsule Take 1 capsule (30 mg total) by mouth daily. (Patient not taking: Reported on 03/30/2021)   ibuprofen (ADVIL) 800 MG tablet Take by mouth.   Melatonin 10 MG TABS Take by mouth.   Multiple Vitamin (MULTIVITAMIN) tablet Take 1 tablet by mouth daily.   rosuvastatin (CRESTOR) 10 MG tablet Take 1 tablet (10 mg total)  by mouth at bedtime.   No facility-administered encounter medications on file as of 04/03/2021.    Allergies  Allergen Reactions   Sulfamethoxazole-Trimethoprim Swelling    REACTION: Hives, tongue swells   Fosamax [Alendronate Sodium] Other (See Comments)    "unknown"   Sulfonamide Derivatives     REACTION: Hives, tongue swells    Review of Systems  Unable to perform ROS: Dementia (per caregiver)  Constitutional:  Positive for activity change and appetite change. Negative for chills, diaphoresis, fatigue and fever.  Respiratory:  Negative for cough.   Genitourinary:  Negative for decreased urine volume.  Neurological:  Positive for weakness.  Psychiatric/Behavioral:  Positive for confusion. Negative for hallucinations.   All other systems reviewed and are negative.       Observations/Objective: No vital signs or physical exam, this was a virtual health encounter.  Pt alert and oriented, answers all questions appropriately, and able to speak in full sentences.    Assessment and Plan: Jamie [name] was seen today for hospitalization follow-up.  Diagnoses and all orders for this visit:   Hospital discharge follow-up Metabolic encephalopathy SIRS (systemic inflammatory response syndrome) (HCC) Altered mental status, unspecified altered mental status type Fever in adult Sepsis, due to  unspecified organism, unspecified whether acute organ dysfunction present ([place]) Chronic kidney disease, unspecified CKD stage Moderate vascular dementia with agitation Hallucination Hx of cancer of lung Hx of brain cancer Today's visit was for Transitional Care Management.  The patient was discharged from [facility] on 03/29/2021 with a primary diagnosis of fever, sepsis, CKD, UTI, metabolic encephalopathy, SIRS, history of brain and lung cancer, hallucinations, and dementia.  Contact with the patient and/or caregiver, by a clinical staff member, was made on 03/30/2021 and was documented as a telephone encounter within the EMR.  Through chart review and discussion with the patient I have determined that management of their condition is of high complexity.   Patient is currently living with her son in [place], [place].  Family unable to travel 3 to 3-1/2 hours for primary care.  Would like referral for primary care in area.  Urgent referral placed today as patient needs to be seen in person for follow-up labs.  Family aware to return to emergency room for evaluation if new, worsening, or persistent symptoms occur.  Family aware if unable to get in with primary care provider in the area, will need to try to get patient into [place] [facility] for evaluation.   Follow Up Instructions: Return in about 1 month (around 05/04/2021), or if symptoms worsen or fail to improve, for PCP chronic management.    I discussed the assessment and treatment plan with the patient. The patient was provided an opportunity to ask questions and all were answered. The patient agreed with the plan and demonstrated an understanding of the instructions.   The patient was advised to call back or seek an in-person evaluation if the symptoms worsen or if the condition fails to improve as anticipated.  The above assessment and management plan was discussed with the patient. The patient  verbalized understanding of and has agreed to the management plan. Patient is aware to call the clinic if they develop any new symptoms or if symptoms persist or worsen. Patient is aware when to return to the clinic for a follow-up visit. Patient educated on when it is appropriate to go to the emergency department.    I provided 40 minutes of time during this telephone encounter.   Jamie Pouch,  FNP-C [facility] [place] [address] [place], [place] [address] [phone] 04/03/2021

## 2021-04-14 ENCOUNTER — Telehealth: Payer: Self-pay | Admitting: Family Medicine

## 2021-04-14 NOTE — Telephone Encounter (Signed)
I'm so sorry to hear that.  I wish her the best on her new endeavors and hope she has a Happy New Year.  Please let us know how we can help getting her settled in with her new PCP in [place].

## 2021-04-18 ENCOUNTER — Other Ambulatory Visit: Payer: Self-pay | Admitting: Family Medicine

## 2021-04-18 DIAGNOSIS — M7918 Myalgia, other site: Secondary | ICD-10-CM

## 2021-04-18 DIAGNOSIS — F339 Major depressive disorder, recurrent, unspecified: Secondary | ICD-10-CM

## 2021-04-29 ENCOUNTER — Telehealth: Payer: Self-pay | Admitting: Family Medicine

## 2021-04-29 NOTE — Telephone Encounter (Signed)
Calling to let us know that she has been released from [facility].

## 2021-05-15 ENCOUNTER — Telehealth: Payer: Medicare Other

## 2021-06-09 ENCOUNTER — Ambulatory Visit (INDEPENDENT_AMBULATORY_CARE_PROVIDER_SITE_OTHER): Payer: Medicare Other

## 2021-06-09 DIAGNOSIS — Z9181 History of falling: Secondary | ICD-10-CM

## 2021-06-09 DIAGNOSIS — E785 Hyperlipidemia, unspecified: Secondary | ICD-10-CM

## 2021-06-09 DIAGNOSIS — R413 Other amnesia: Secondary | ICD-10-CM

## 2021-06-09 DIAGNOSIS — M810 Age-related osteoporosis without current pathological fracture: Secondary | ICD-10-CM

## 2021-06-09 DIAGNOSIS — N189 Chronic kidney disease, unspecified: Secondary | ICD-10-CM

## 2021-06-09 DIAGNOSIS — Z85118 Personal history of other malignant neoplasm of bronchus and lung: Secondary | ICD-10-CM

## 2021-06-09 DIAGNOSIS — Z85841 Personal history of malignant neoplasm of brain: Secondary | ICD-10-CM

## 2021-06-09 NOTE — Chronic Care Management (AMB) (Signed)
Chronic Care Management    Clinical Social Work Note  06/09/2021 Name: Jamie [name] MRN: [id number] DOB: 1950/07/23  Jamie [name] is a 71 y.o. year old female who is a primary care patient of No primary care provider on file.. The CCM team was consulted to assist the patient with chronic disease management and/or care coordination needs related to: [employer] .   Engaged with patient/ son of patient, Jamie [name],  by telephone for follow up visit in response to provider referral for social work chronic care management and care coordination services.   Consent to Services:  The patient was given information about Chronic Care Management services, agreed to services, and gave verbal consent prior to initiation of services.  Please see initial visit note for detailed documentation.   Patient agreed to services and consent obtained.   Assessment: Review of patient past medical history, allergies, medications, and health status, including review of relevant consultants reports was performed today as part of a comprehensive evaluation and provision of chronic care management and care coordination services.     SDOH (Social Determinants of Health) assessments and interventions performed:  SDOH Interventions    Flowsheet Row Most Recent Value  SDOH Interventions   Stress Interventions Other (Comment)  [client has stress related to managing medical needs]  Depression Interventions/Treatment  Counseling        Advanced Directives Status: See Vynca application for related entries.  CCM Care Plan  Allergies  Allergen Reactions   Sulfamethoxazole-Trimethoprim Swelling    REACTION: Hives, tongue swells   Fosamax [Alendronate Sodium] Other (See Comments)    "unknown"   Sulfonamide Derivatives     REACTION: Hives, tongue swells    Outpatient Encounter Medications as of 06/09/2021  Medication Sig   acetaminophen (TYLENOL) 500 MG tablet Take by mouth.   cyanocobalamin 1000  MCG tablet Take 1 tablet by mouth daily.   donepezil (ARICEPT) 10 MG tablet Take 1 tablet (10 mg total) by mouth at bedtime. Please est care with new doctor   DULoxetine (CYMBALTA) 30 MG capsule Take 1 capsule (30 mg total) by mouth daily. (Patient not taking: Reported on 03/30/2021)   ibuprofen (ADVIL) 800 MG tablet Take by mouth.   Melatonin 10 MG TABS Take by mouth.   Multiple Vitamin (MULTIVITAMIN) tablet Take 1 tablet by mouth daily.   rosuvastatin (CRESTOR) 10 MG tablet Take 1 tablet (10 mg total) by mouth at bedtime.   No facility-administered encounter medications on file as of 06/09/2021.    Patient Active Problem List   Diagnosis Date Noted   SIRS (systemic inflammatory response syndrome) ([place]) 03/30/2021   Fever 03/26/2021   Hypocalcemia 12/26/2019   Leukopenia 12/26/2019   Dementia without behavioral disturbance ([place]) 12/26/2019   Seasonal allergic rhinitis due to pollen 09/06/2018   Folate deficiency 09/06/2018   CKD (chronic kidney disease), stage III 10/31/2017   Altered mental status 10/30/2017   Closed nondisplaced comminuted fracture of shaft of right humerus 07/25/2017   Lumbar radiculopathy 06/21/2017   Multiple falls 06/21/2017   Ataxia 06/21/2017   Claustrophobia 06/21/2017   Imbalance 06/21/2017   Memory loss 02/16/2017   History of therapeutic radiation 08/03/2016   Osteoporosis 10/09/2014   At high risk for falls 10/09/2014   Hx of cancer of lung 08/01/2014   Hx of brain cancer 08/01/2014   Personal history of colonic polyps - adenomas 07/18/2013   Memory impairment 03/22/2011   History of cancer 03/22/2011   Hyperlipidemia 03/22/2011  Conditions to be addressed/monitored: monitor client completion of ADLs  Care Plan : LCSW care plan  Updates made by Katha Cabal, LCSW since 06/09/2021 12:00 AM     Problem: Coping Skills (General Plan of Care)      Goal: Coping Skills Enhanced: Manage memory issues ; complete ADLs daily as able   Start  Date: 06/09/2021  Expected End Date: 06/15/2021  This Visit's Progress: On track  Recent Progress: On track  Priority: Medium  Note:   Current barriers:   Patient in need of assistance with connecting to community resources for possible help with managing in home care needs of client Patient is unable to independently navigate community resource options without care coordination support Dementia Some mobility issues Some pain issues  Clinical Goals:  patient will work with SW in next 30 days to address concerns related to memory challenges of client Patient will work with SW in next 30 days to address concerns related to client completion of ADLs and other daily activities Client will attend scheduled medical appointments in next 30 days  Clinical Interventions:  Collaboration with Janora Norlander, DO regarding development and update of comprehensive plan of care as evidenced by provider attestation and co-signature Discussed client needs with Heron Sabins, son of client Discussed with [name] ambulation challenges of client.[name] said that client  has a cane and a walker to use as needed to ambulate. Reviewed appetite of client. [name] said client is eating well. Reviewed sleeping issues of client. Client is sleeping well. Discussed PCP for client. [name] said that client was in process of getting established with new PCP in area. He could not recall name of PCP. LCSW reminded [name] that he could call [facility] as needed to discuss medical records information of [name]. LCSW mentioned that a consent would likely have to be signed and conveyed to [facility] for release of any medical record for client. [name] was appreciative of information.  LCSW will discharge client today from [facility] since client now lives in [place], [place] and will establish with a new PCP.   Client Coping Skills:  Takes medications as prescribed Attends scheduled medical appointments Has family support from her son  and grandchildren  Client deficits: Memory issues Dementia Some mobility issues  Patient Goals:  In next 30 days, client will attend all scheduled medical appointments In next 30 days client will allow time for rest and relaxation for client -  Follow Up Plan: LCSW to discharge client today from CCM services since client now resides in [place], [place] and is establishing care with new PCP      Norva Riffle.Yanira Tolsma MSW, [place] representative [facility] Care Management [phone]

## 2021-06-09 NOTE — Patient Instructions (Addendum)
Visit Information  Patient Goals:  Protect My Health (Patient). Manage memory issues. Complete ADLs daily as able.  Timeframe:  Short-Term Goal Priority:  Medium  Progress:  On Track Start Date:   06/09/21                Expected End Date:   06/15/21            Follow Up Date LCSW is discharging client today from CCM services.   Protect My Health (Patient) Manage memory issues; Complete ADLs daily as able    Why is this important?   Screening tests can find diseases early when they are easier to treat.  Your doctor or nurse will talk with you about which tests are important for you.  Getting shots for common diseases like the flu and shingles will help prevent them.     Client Coping Skills:  Takes medications as prescribed Attends scheduled medical appointments Has family support from her son and grandchildren  Client deficits: Memory issues Dementia Some mobility issues  Patient Goals:  In next 30 days, client will attend all scheduled medical appointments In next 30 days client will allow time for rest and relaxation for client -  Follow Up Plan: LCSW to discharge client today from CCM services.   Norva Riffle.Laurens Matheny MSW, [place] representative [facility] Care Management [phone]

## 2021-06-16 DIAGNOSIS — Z85118 Personal history of other malignant neoplasm of bronchus and lung: Secondary | ICD-10-CM

## 2021-06-16 DIAGNOSIS — M810 Age-related osteoporosis without current pathological fracture: Secondary | ICD-10-CM

## 2021-06-16 DIAGNOSIS — N189 Chronic kidney disease, unspecified: Secondary | ICD-10-CM

## 2021-06-16 DIAGNOSIS — E785 Hyperlipidemia, unspecified: Secondary | ICD-10-CM

## 2021-07-16 ENCOUNTER — Other Ambulatory Visit: Payer: Self-pay | Admitting: Family Medicine

## 2021-07-16 DIAGNOSIS — E7800 Pure hypercholesterolemia, unspecified: Secondary | ICD-10-CM

## 2021-07-16 DIAGNOSIS — F01B11 Vascular dementia, moderate, with agitation: Secondary | ICD-10-CM

## 2021-08-18 ENCOUNTER — Other Ambulatory Visit: Payer: Self-pay | Admitting: Family Medicine

## 2021-08-18 DIAGNOSIS — F01B11 Vascular dementia, moderate, with agitation: Secondary | ICD-10-CM

## 2021-10-21 ENCOUNTER — Ambulatory Visit: Payer: Self-pay | Admitting: *Deleted

## 2021-10-21 NOTE — Chronic Care Management (AMB) (Signed)
   10/21/2021  Jamie [name] 07-13-1950 [id number]  Patient has not recently engaged with the CCM Team at [facility]. Removing RN Care Manager from Care Team and closing [facility]. I will reach out to PharmD and/or LCSW to inform them of my case closure.  [name], BSN, RN-BC Embedded Chronic Care Manager [facility] Direct Dial: [phone]
